# Patient Record
Sex: Male | Born: 1960
Health system: Southern US, Community
[De-identification: ages and names within clinical notes are randomized; demographics above are authoritative.]

## PROBLEM LIST (undated history)

## (undated) DIAGNOSIS — C61 Malignant neoplasm of prostate: Secondary | ICD-10-CM

## (undated) DIAGNOSIS — Z8042 Family history of malignant neoplasm of prostate: Secondary | ICD-10-CM

## (undated) DIAGNOSIS — Z803 Family history of malignant neoplasm of breast: Secondary | ICD-10-CM

## (undated) HISTORY — PX: VASECTOMY: SHX75

## (undated) HISTORY — DX: Family history of malignant neoplasm of prostate: Z80.42

## (undated) HISTORY — PX: PROSTATECTOMY: SHX69

## (undated) HISTORY — DX: Malignant neoplasm of prostate: C61

## (undated) HISTORY — DX: Family history of malignant neoplasm of breast: Z80.3

## (undated) HISTORY — PX: PROSTATE BIOPSY: SHX241

---

## 2006-08-10 ENCOUNTER — Emergency Department (HOSPITAL_COMMUNITY): Admission: EM | Admit: 2006-08-10 | Discharge: 2006-08-10 | Payer: Self-pay | Admitting: Emergency Medicine

## 2006-08-24 DIAGNOSIS — C61 Malignant neoplasm of prostate: Secondary | ICD-10-CM

## 2006-08-24 HISTORY — DX: Malignant neoplasm of prostate: C61

## 2006-09-22 ENCOUNTER — Ambulatory Visit (HOSPITAL_COMMUNITY): Admission: RE | Admit: 2006-09-22 | Discharge: 2006-09-22 | Payer: Self-pay | Admitting: Urology

## 2006-11-05 ENCOUNTER — Encounter (INDEPENDENT_AMBULATORY_CARE_PROVIDER_SITE_OTHER): Payer: Self-pay | Admitting: Specialist

## 2006-11-05 ENCOUNTER — Inpatient Hospital Stay (HOSPITAL_COMMUNITY): Admission: RE | Admit: 2006-11-05 | Discharge: 2006-11-07 | Payer: Self-pay | Admitting: Orthopedic Surgery

## 2007-01-04 ENCOUNTER — Ambulatory Visit: Payer: Self-pay | Admitting: Oncology

## 2007-02-01 LAB — CBC WITH DIFFERENTIAL/PLATELET
EOS%: 2.5 % (ref 0.0–7.0)
Eosinophils Absolute: 0.1 10*3/uL (ref 0.0–0.5)
LYMPH%: 36 % (ref 14.0–48.0)
MCH: 30.5 pg (ref 28.0–33.4)
MCV: 84.7 fL (ref 81.6–98.0)
MONO%: 8.1 % (ref 0.0–13.0)
Platelets: 237 10*3/uL (ref 145–400)
RBC: 5.32 10*6/uL (ref 4.20–5.71)
RDW: 12.9 % (ref 11.2–14.6)

## 2007-02-01 LAB — COMPREHENSIVE METABOLIC PANEL
AST: 33 U/L (ref 0–37)
Albumin: 4.6 g/dL (ref 3.5–5.2)
Alkaline Phosphatase: 96 U/L (ref 39–117)
BUN: 17 mg/dL (ref 6–23)
Glucose, Bld: 106 mg/dL — ABNORMAL HIGH (ref 70–99)
Potassium: 4.2 mEq/L (ref 3.5–5.3)
Sodium: 141 mEq/L (ref 135–145)
Total Bilirubin: 2.1 mg/dL — ABNORMAL HIGH (ref 0.3–1.2)
Total Protein: 7.3 g/dL (ref 6.0–8.3)

## 2007-02-01 LAB — PSA: PSA: 29.32 ng/mL — ABNORMAL HIGH (ref 0.10–4.00)

## 2007-02-16 ENCOUNTER — Ambulatory Visit (HOSPITAL_COMMUNITY): Admission: RE | Admit: 2007-02-16 | Discharge: 2007-02-16 | Payer: Self-pay | Admitting: Oncology

## 2007-03-01 ENCOUNTER — Ambulatory Visit: Payer: Self-pay | Admitting: Oncology

## 2007-03-03 LAB — COMPREHENSIVE METABOLIC PANEL
ALT: 47 U/L (ref 0–53)
Alkaline Phosphatase: 90 U/L (ref 39–117)
CO2: 25 mEq/L (ref 19–32)
Sodium: 140 mEq/L (ref 135–145)
Total Bilirubin: 2.2 mg/dL — ABNORMAL HIGH (ref 0.3–1.2)
Total Protein: 7.3 g/dL (ref 6.0–8.3)

## 2007-03-03 LAB — CBC WITH DIFFERENTIAL/PLATELET
BASO%: 0.5 % (ref 0.0–2.0)
LYMPH%: 36.8 % (ref 14.0–48.0)
MCHC: 36.3 g/dL — ABNORMAL HIGH (ref 32.0–35.9)
MONO#: 0.4 10*3/uL (ref 0.1–0.9)
Platelets: 270 10*3/uL (ref 145–400)
RBC: 5.23 10*6/uL (ref 4.20–5.71)
WBC: 6.3 10*3/uL (ref 4.0–10.0)

## 2007-03-03 LAB — PSA: PSA: 39.89 ng/mL — ABNORMAL HIGH (ref 0.10–4.00)

## 2007-04-05 LAB — COMPREHENSIVE METABOLIC PANEL
ALT: 56 U/L — ABNORMAL HIGH (ref 0–53)
Albumin: 4.5 g/dL (ref 3.5–5.2)
CO2: 27 mEq/L (ref 19–32)
Chloride: 107 mEq/L (ref 96–112)
Glucose, Bld: 69 mg/dL — ABNORMAL LOW (ref 70–99)
Potassium: 4.3 mEq/L (ref 3.5–5.3)
Sodium: 143 mEq/L (ref 135–145)
Total Bilirubin: 2 mg/dL — ABNORMAL HIGH (ref 0.3–1.2)
Total Protein: 7.2 g/dL (ref 6.0–8.3)

## 2007-04-05 LAB — CBC WITH DIFFERENTIAL/PLATELET
BASO%: 0.5 % (ref 0.0–2.0)
EOS%: 2.1 % (ref 0.0–7.0)
HCT: 44.1 % (ref 38.7–49.9)
LYMPH%: 37.4 % (ref 14.0–48.0)
MCH: 31.3 pg (ref 28.0–33.4)
MCHC: 36.3 g/dL — ABNORMAL HIGH (ref 32.0–35.9)
MCV: 86.1 fL (ref 81.6–98.0)
MONO%: 8.3 % (ref 0.0–13.0)
NEUT%: 51.7 % (ref 40.0–75.0)
Platelets: 235 10*3/uL (ref 145–400)

## 2007-04-05 LAB — PSA: PSA: 35.99 ng/mL — ABNORMAL HIGH (ref 0.10–4.00)

## 2007-04-29 ENCOUNTER — Ambulatory Visit: Payer: Self-pay | Admitting: Oncology

## 2007-05-03 LAB — COMPREHENSIVE METABOLIC PANEL
ALT: 47 U/L (ref 0–53)
AST: 33 U/L (ref 0–37)
CO2: 26 mEq/L (ref 19–32)
Chloride: 105 mEq/L (ref 96–112)
Creatinine, Ser: 1.02 mg/dL (ref 0.40–1.50)
Sodium: 141 mEq/L (ref 135–145)
Total Bilirubin: 2 mg/dL — ABNORMAL HIGH (ref 0.3–1.2)
Total Protein: 7.5 g/dL (ref 6.0–8.3)

## 2007-05-03 LAB — CBC WITH DIFFERENTIAL/PLATELET
BASO%: 0.5 % (ref 0.0–2.0)
EOS%: 2.6 % (ref 0.0–7.0)
HCT: 44.7 % (ref 38.7–49.9)
LYMPH%: 35.5 % (ref 14.0–48.0)
MCH: 31 pg (ref 28.0–33.4)
MCHC: 35.9 g/dL (ref 32.0–35.9)
MONO#: 0.4 10*3/uL (ref 0.1–0.9)
NEUT%: 54.4 % (ref 40.0–75.0)
RBC: 5.18 10*6/uL (ref 4.20–5.71)
WBC: 5.3 10*3/uL (ref 4.0–10.0)
lymph#: 1.9 10*3/uL (ref 0.9–3.3)

## 2007-08-11 ENCOUNTER — Ambulatory Visit: Payer: Self-pay | Admitting: Oncology

## 2007-08-15 LAB — COMPREHENSIVE METABOLIC PANEL
CO2: 25 mEq/L (ref 19–32)
Creatinine, Ser: 0.98 mg/dL (ref 0.40–1.50)
Glucose, Bld: 105 mg/dL — ABNORMAL HIGH (ref 70–99)
Sodium: 142 mEq/L (ref 135–145)
Total Bilirubin: 2 mg/dL — ABNORMAL HIGH (ref 0.3–1.2)
Total Protein: 7.9 g/dL (ref 6.0–8.3)

## 2007-08-15 LAB — CBC WITH DIFFERENTIAL/PLATELET
Eosinophils Absolute: 0.2 10*3/uL (ref 0.0–0.5)
HCT: 45.4 % (ref 38.7–49.9)
HGB: 15.9 g/dL (ref 13.0–17.1)
LYMPH%: 38.8 % (ref 14.0–48.0)
MONO#: 0.3 10*3/uL (ref 0.1–0.9)
NEUT#: 2.9 10*3/uL (ref 1.5–6.5)
NEUT%: 51.2 % (ref 40.0–75.0)
Platelets: 240 10*3/uL (ref 145–400)
WBC: 5.6 10*3/uL (ref 4.0–10.0)

## 2007-08-15 LAB — PSA: PSA: 3.07 ng/mL (ref 0.10–4.00)

## 2007-11-10 ENCOUNTER — Ambulatory Visit: Payer: Self-pay | Admitting: Oncology

## 2007-11-14 LAB — COMPREHENSIVE METABOLIC PANEL
ALT: 39 U/L (ref 0–53)
AST: 30 U/L (ref 0–37)
Albumin: 4.4 g/dL (ref 3.5–5.2)
Alkaline Phosphatase: 97 U/L (ref 39–117)
Calcium: 10 mg/dL (ref 8.4–10.5)
Chloride: 107 mEq/L (ref 96–112)
Potassium: 4.2 mEq/L (ref 3.5–5.3)
Sodium: 143 mEq/L (ref 135–145)
Total Protein: 7.3 g/dL (ref 6.0–8.3)

## 2007-11-14 LAB — CBC WITH DIFFERENTIAL/PLATELET
BASO%: 0.4 % (ref 0.0–2.0)
Basophils Absolute: 0 10*3/uL (ref 0.0–0.1)
EOS%: 2.3 % (ref 0.0–7.0)
HGB: 15 g/dL (ref 13.0–17.1)
MCH: 30.5 pg (ref 28.0–33.4)
MCHC: 35.5 g/dL (ref 32.0–35.9)
MONO%: 7.5 % (ref 0.0–13.0)
RBC: 4.91 10*6/uL (ref 4.20–5.71)
RDW: 12.7 % (ref 11.2–14.6)
lymph#: 1.9 10*3/uL (ref 0.9–3.3)

## 2008-02-10 ENCOUNTER — Ambulatory Visit: Payer: Self-pay | Admitting: Oncology

## 2008-02-10 LAB — CBC WITH DIFFERENTIAL/PLATELET
Eosinophils Absolute: 0.2 10*3/uL (ref 0.0–0.5)
MONO#: 0.4 10*3/uL (ref 0.1–0.9)
NEUT#: 3.4 10*3/uL (ref 1.5–6.5)
RBC: 4.94 10*6/uL (ref 4.20–5.71)
RDW: 12.7 % (ref 11.2–14.6)
WBC: 6.5 10*3/uL (ref 4.0–10.0)

## 2008-02-11 LAB — PSA: PSA: 1.22 ng/mL (ref 0.10–4.00)

## 2008-02-11 LAB — COMPREHENSIVE METABOLIC PANEL
Albumin: 4.5 g/dL (ref 3.5–5.2)
CO2: 24 mEq/L (ref 19–32)
Glucose, Bld: 83 mg/dL (ref 70–99)
Potassium: 4.2 mEq/L (ref 3.5–5.3)
Sodium: 143 mEq/L (ref 135–145)
Total Protein: 7.3 g/dL (ref 6.0–8.3)

## 2008-05-11 ENCOUNTER — Ambulatory Visit: Payer: Self-pay | Admitting: Oncology

## 2008-05-15 LAB — CBC WITH DIFFERENTIAL/PLATELET
BASO%: 0.6 % (ref 0.0–2.0)
EOS%: 3.2 % (ref 0.0–7.0)
LYMPH%: 36.7 % (ref 14.0–48.0)
MCH: 30.6 pg (ref 28.0–33.4)
MCHC: 35 g/dL (ref 32.0–35.9)
MONO#: 0.5 10*3/uL (ref 0.1–0.9)
Platelets: 238 10*3/uL (ref 145–400)
RBC: 4.88 10*6/uL (ref 4.20–5.71)
WBC: 5.2 10*3/uL (ref 4.0–10.0)
lymph#: 1.9 10*3/uL (ref 0.9–3.3)

## 2008-05-15 LAB — COMPREHENSIVE METABOLIC PANEL
Albumin: 4 g/dL (ref 3.5–5.2)
Alkaline Phosphatase: 114 U/L (ref 39–117)
BUN: 15 mg/dL (ref 6–23)
CO2: 31 mEq/L (ref 19–32)
Calcium: 9.8 mg/dL (ref 8.4–10.5)
Sodium: 141 mEq/L (ref 135–145)

## 2008-08-09 ENCOUNTER — Ambulatory Visit: Payer: Self-pay | Admitting: Oncology

## 2008-08-13 LAB — COMPREHENSIVE METABOLIC PANEL
ALT: 42 U/L (ref 0–53)
Albumin: 4.6 g/dL (ref 3.5–5.2)
Alkaline Phosphatase: 115 U/L (ref 39–117)
CO2: 26 mEq/L (ref 19–32)
Glucose, Bld: 85 mg/dL (ref 70–99)
Potassium: 4.3 mEq/L (ref 3.5–5.3)
Sodium: 140 mEq/L (ref 135–145)
Total Protein: 7.5 g/dL (ref 6.0–8.3)

## 2008-08-13 LAB — CBC WITH DIFFERENTIAL/PLATELET
Eosinophils Absolute: 0.2 10*3/uL (ref 0.0–0.5)
MONO#: 0.5 10*3/uL (ref 0.1–0.9)
MONO%: 7.8 % (ref 0.0–13.0)
NEUT#: 3.6 10*3/uL (ref 1.5–6.5)
RBC: 4.8 10*6/uL (ref 4.20–5.71)
RDW: 12.8 % (ref 11.2–14.6)
WBC: 7 10*3/uL (ref 4.0–10.0)

## 2008-11-08 ENCOUNTER — Ambulatory Visit: Payer: Self-pay | Admitting: Oncology

## 2008-11-12 LAB — CBC WITH DIFFERENTIAL/PLATELET
Basophils Absolute: 0 10*3/uL (ref 0.0–0.1)
Eosinophils Absolute: 0.1 10*3/uL (ref 0.0–0.5)
HGB: 14.4 g/dL (ref 13.0–17.1)
MCV: 87.4 fL (ref 79.3–98.0)
MONO%: 6.3 % (ref 0.0–14.0)
NEUT#: 2.6 10*3/uL (ref 1.5–6.5)
RDW: 13.1 % (ref 11.0–14.6)
lymph#: 2.2 10*3/uL (ref 0.9–3.3)

## 2008-11-12 LAB — COMPREHENSIVE METABOLIC PANEL
Albumin: 4.1 g/dL (ref 3.5–5.2)
BUN: 16 mg/dL (ref 6–23)
Calcium: 9.4 mg/dL (ref 8.4–10.5)
Chloride: 105 mEq/L (ref 96–112)
Glucose, Bld: 102 mg/dL — ABNORMAL HIGH (ref 70–99)
Potassium: 4.1 mEq/L (ref 3.5–5.3)

## 2009-02-14 ENCOUNTER — Ambulatory Visit: Payer: Self-pay | Admitting: Oncology

## 2009-02-18 LAB — CBC WITH DIFFERENTIAL/PLATELET
BASO%: 0.4 % (ref 0.0–2.0)
EOS%: 3 % (ref 0.0–7.0)
HCT: 42.9 % (ref 38.4–49.9)
LYMPH%: 33.9 % (ref 14.0–49.0)
MCH: 30.7 pg (ref 27.2–33.4)
MCHC: 35.1 g/dL (ref 32.0–36.0)
MONO%: 7.4 % (ref 0.0–14.0)
NEUT%: 55.3 % (ref 39.0–75.0)
Platelets: 202 10*3/uL (ref 140–400)
RBC: 4.91 10*6/uL (ref 4.20–5.82)

## 2009-02-18 LAB — COMPREHENSIVE METABOLIC PANEL
ALT: 35 U/L (ref 0–53)
AST: 28 U/L (ref 0–37)
Alkaline Phosphatase: 119 U/L — ABNORMAL HIGH (ref 39–117)
Creatinine, Ser: 0.89 mg/dL (ref 0.40–1.50)
Total Bilirubin: 1.3 mg/dL — ABNORMAL HIGH (ref 0.3–1.2)

## 2009-02-18 LAB — PSA: PSA: 0.15 ng/mL (ref 0.10–4.00)

## 2009-05-23 ENCOUNTER — Ambulatory Visit: Payer: Self-pay | Admitting: Oncology

## 2009-05-27 ENCOUNTER — Ambulatory Visit (HOSPITAL_COMMUNITY): Admission: RE | Admit: 2009-05-27 | Discharge: 2009-05-27 | Payer: Self-pay | Admitting: Oncology

## 2009-05-27 LAB — CBC WITH DIFFERENTIAL/PLATELET
Basophils Absolute: 0 10*3/uL (ref 0.0–0.1)
EOS%: 3.1 % (ref 0.0–7.0)
Eosinophils Absolute: 0.2 10*3/uL (ref 0.0–0.5)
HGB: 16.3 g/dL (ref 13.0–17.1)
MONO%: 8.4 % (ref 0.0–14.0)
NEUT#: 2.8 10*3/uL (ref 1.5–6.5)
RBC: 5.3 10*6/uL (ref 4.20–5.82)
RDW: 12.7 % (ref 11.0–14.6)
lymph#: 1.8 10*3/uL (ref 0.9–3.3)

## 2009-05-27 LAB — COMPREHENSIVE METABOLIC PANEL
AST: 32 U/L (ref 0–37)
Albumin: 4.4 g/dL (ref 3.5–5.2)
Alkaline Phosphatase: 141 U/L — ABNORMAL HIGH (ref 39–117)
BUN: 12 mg/dL (ref 6–23)
Calcium: 9.8 mg/dL (ref 8.4–10.5)
Chloride: 102 mEq/L (ref 96–112)
Glucose, Bld: 100 mg/dL — ABNORMAL HIGH (ref 70–99)
Potassium: 4.3 mEq/L (ref 3.5–5.3)
Sodium: 138 mEq/L (ref 135–145)
Total Protein: 7.7 g/dL (ref 6.0–8.3)

## 2009-08-22 ENCOUNTER — Ambulatory Visit: Payer: Self-pay | Admitting: Oncology

## 2009-08-27 LAB — CBC WITH DIFFERENTIAL/PLATELET
EOS%: 2.9 % (ref 0.0–7.0)
Eosinophils Absolute: 0.2 10*3/uL (ref 0.0–0.5)
HGB: 16.3 g/dL (ref 13.0–17.1)
MCH: 30.5 pg (ref 27.2–33.4)
MCV: 87.7 fL (ref 79.3–98.0)
NEUT#: 2.1 10*3/uL (ref 1.5–6.5)
Platelets: 191 10*3/uL (ref 140–400)
RBC: 5.36 10*6/uL (ref 4.20–5.82)
RDW: 12.7 % (ref 11.0–14.6)
WBC: 5.8 10*3/uL (ref 4.0–10.3)
lymph#: 3 10*3/uL (ref 0.9–3.3)

## 2009-08-27 LAB — COMPREHENSIVE METABOLIC PANEL
ALT: 41 U/L (ref 0–53)
Potassium: 4.3 mEq/L (ref 3.5–5.3)
Total Bilirubin: 2 mg/dL — ABNORMAL HIGH (ref 0.3–1.2)
Total Protein: 7.4 g/dL (ref 6.0–8.3)

## 2009-08-27 LAB — TESTOSTERONE: Testosterone: 380.23 ng/dL (ref 350–890)

## 2009-08-27 LAB — PSA: PSA: 1.7 ng/mL (ref 0.10–4.00)

## 2009-11-28 ENCOUNTER — Ambulatory Visit: Payer: Self-pay | Admitting: Oncology

## 2009-12-02 LAB — CBC WITH DIFFERENTIAL/PLATELET
HCT: 41.8 % (ref 38.4–49.9)
HGB: 14.7 g/dL (ref 13.0–17.1)
LYMPH%: 42.7 % (ref 14.0–49.0)
MCH: 30.9 pg (ref 27.2–33.4)
MCHC: 35.1 g/dL (ref 32.0–36.0)
MONO#: 0.4 10*3/uL (ref 0.1–0.9)
NEUT%: 47.2 % (ref 39.0–75.0)
Platelets: 210 10*3/uL (ref 140–400)
RBC: 4.74 10*6/uL (ref 4.20–5.82)
RDW: 13.4 % (ref 11.0–14.6)

## 2009-12-02 LAB — COMPREHENSIVE METABOLIC PANEL
ALT: 34 U/L (ref 0–53)
AST: 29 U/L (ref 0–37)
BUN: 17 mg/dL (ref 6–23)
CO2: 24 mEq/L (ref 19–32)
Chloride: 105 mEq/L (ref 96–112)
Glucose, Bld: 107 mg/dL — ABNORMAL HIGH (ref 70–99)
Potassium: 4.4 mEq/L (ref 3.5–5.3)

## 2010-02-28 ENCOUNTER — Ambulatory Visit: Payer: Self-pay | Admitting: Oncology

## 2010-03-04 LAB — CBC WITH DIFFERENTIAL/PLATELET
BASO%: 0.6 % (ref 0.0–2.0)
Eosinophils Absolute: 0.2 10*3/uL (ref 0.0–0.5)
HCT: 41.5 % (ref 38.4–49.9)
HGB: 14.3 g/dL (ref 13.0–17.1)
LYMPH%: 43.6 % (ref 14.0–49.0)
MCH: 30.4 pg (ref 27.2–33.4)
NEUT%: 43.6 % (ref 39.0–75.0)
RBC: 4.69 10*6/uL (ref 4.20–5.82)
lymph#: 2.5 10*3/uL (ref 0.9–3.3)

## 2010-03-05 LAB — COMPREHENSIVE METABOLIC PANEL
AST: 36 U/L (ref 0–37)
BUN: 17 mg/dL (ref 6–23)
CO2: 23 mEq/L (ref 19–32)
Creatinine, Ser: 0.95 mg/dL (ref 0.40–1.50)
Glucose, Bld: 102 mg/dL — ABNORMAL HIGH (ref 70–99)
Potassium: 4.3 mEq/L (ref 3.5–5.3)
Sodium: 141 mEq/L (ref 135–145)

## 2010-03-05 LAB — PSA: PSA: 0.17 ng/mL (ref 0.10–4.00)

## 2010-06-02 ENCOUNTER — Ambulatory Visit: Payer: Self-pay | Admitting: Oncology

## 2010-06-04 LAB — COMPREHENSIVE METABOLIC PANEL
Chloride: 105 mEq/L (ref 96–112)
Sodium: 142 mEq/L (ref 135–145)

## 2010-06-04 LAB — CBC WITH DIFFERENTIAL/PLATELET
BASO%: 0.5 % (ref 0.0–2.0)
Eosinophils Absolute: 0.1 10*3/uL (ref 0.0–0.5)
HCT: 41.5 % (ref 38.4–49.9)
HGB: 14.6 g/dL (ref 13.0–17.1)
LYMPH%: 36.2 % (ref 14.0–49.0)
MCH: 31 pg (ref 27.2–33.4)
MCHC: 35.1 g/dL (ref 32.0–36.0)
MONO#: 0.4 10*3/uL (ref 0.1–0.9)
MONO%: 6.8 % (ref 0.0–14.0)
NEUT%: 54.8 % (ref 39.0–75.0)
Platelets: 241 10*3/uL (ref 140–400)
RBC: 4.7 10*6/uL (ref 4.20–5.82)
RDW: 12.6 % (ref 11.0–14.6)
WBC: 6.4 10*3/uL (ref 4.0–10.3)
lymph#: 2.3 10*3/uL (ref 0.9–3.3)

## 2010-08-28 ENCOUNTER — Ambulatory Visit: Payer: Self-pay | Admitting: Oncology

## 2010-08-28 LAB — CBC WITH DIFFERENTIAL/PLATELET
BASO%: 0.3 % (ref 0.0–2.0)
Basophils Absolute: 0 10*3/uL (ref 0.0–0.1)
EOS%: 1.4 % (ref 0.0–7.0)
Eosinophils Absolute: 0.1 10*3/uL (ref 0.0–0.5)
HCT: 41.7 % (ref 38.4–49.9)
HGB: 14.5 g/dL (ref 13.0–17.1)
LYMPH%: 26.8 % (ref 14.0–49.0)
MCH: 30.4 pg (ref 27.2–33.4)
MCHC: 34.7 g/dL (ref 32.0–36.0)
MCV: 87.4 fL (ref 79.3–98.0)
MONO#: 0.5 10*3/uL (ref 0.1–0.9)
MONO%: 6.3 % (ref 0.0–14.0)
NEUT#: 4.8 10*3/uL (ref 1.5–6.5)
NEUT%: 65.2 % (ref 39.0–75.0)
Platelets: 211 10*3/uL (ref 140–400)
RBC: 4.77 10*6/uL (ref 4.20–5.82)
RDW: 12.8 % (ref 11.0–14.6)
WBC: 7.4 10*3/uL (ref 4.0–10.3)
lymph#: 2 10*3/uL (ref 0.9–3.3)

## 2010-08-28 LAB — COMPREHENSIVE METABOLIC PANEL
ALT: 38 U/L (ref 0–53)
AST: 35 U/L (ref 0–37)
Albumin: 4.6 g/dL (ref 3.5–5.2)
Alkaline Phosphatase: 90 U/L (ref 39–117)
BUN: 16 mg/dL (ref 6–23)
CO2: 25 mEq/L (ref 19–32)
Calcium: 9.9 mg/dL (ref 8.4–10.5)
Chloride: 103 mEq/L (ref 96–112)
Creatinine, Ser: 1.11 mg/dL (ref 0.40–1.50)
Glucose, Bld: 86 mg/dL (ref 70–99)
Potassium: 4.3 mEq/L (ref 3.5–5.3)
Sodium: 141 mEq/L (ref 135–145)
Total Bilirubin: 1.5 mg/dL — ABNORMAL HIGH (ref 0.3–1.2)
Total Protein: 7.2 g/dL (ref 6.0–8.3)

## 2010-08-28 LAB — PSA: PSA: 0.41 ng/mL (ref <=4.00)

## 2010-09-11 ENCOUNTER — Other Ambulatory Visit: Payer: Self-pay | Admitting: Oncology

## 2010-09-11 DIAGNOSIS — C61 Malignant neoplasm of prostate: Secondary | ICD-10-CM

## 2010-09-13 ENCOUNTER — Other Ambulatory Visit: Payer: Self-pay | Admitting: Oncology

## 2010-09-13 DIAGNOSIS — Z8546 Personal history of malignant neoplasm of prostate: Secondary | ICD-10-CM

## 2010-09-14 ENCOUNTER — Encounter: Payer: Self-pay | Admitting: Oncology

## 2010-10-21 ENCOUNTER — Ambulatory Visit (HOSPITAL_COMMUNITY)
Admission: RE | Admit: 2010-10-21 | Discharge: 2010-10-21 | Disposition: A | Discharge status: Home / Self Care | Payer: BC Managed Care – PPO | Source: Ambulatory Visit | Attending: Oncology | Admitting: Oncology

## 2010-10-21 ENCOUNTER — Encounter (HOSPITAL_COMMUNITY)
Admission: RE | Admit: 2010-10-21 | Discharge: 2010-10-21 | Disposition: A | Discharge status: Home / Self Care | Payer: BC Managed Care – PPO | Source: Ambulatory Visit | Attending: Oncology | Admitting: Oncology

## 2010-10-21 ENCOUNTER — Other Ambulatory Visit: Payer: Self-pay | Admitting: Oncology

## 2010-10-21 ENCOUNTER — Encounter (HOSPITAL_BASED_OUTPATIENT_CLINIC_OR_DEPARTMENT_OTHER): Payer: BC Managed Care – PPO | Admitting: Oncology

## 2010-10-21 ENCOUNTER — Ambulatory Visit (HOSPITAL_COMMUNITY): Admission: RE | Admit: 2010-10-21 | Payer: Self-pay | Source: Ambulatory Visit

## 2010-10-21 ENCOUNTER — Encounter (HOSPITAL_COMMUNITY): Payer: Self-pay

## 2010-10-21 DIAGNOSIS — R109 Unspecified abdominal pain: Secondary | ICD-10-CM | POA: Insufficient documentation

## 2010-10-21 DIAGNOSIS — C61 Malignant neoplasm of prostate: Secondary | ICD-10-CM

## 2010-10-21 DIAGNOSIS — Z8546 Personal history of malignant neoplasm of prostate: Secondary | ICD-10-CM

## 2010-10-21 DIAGNOSIS — Z5111 Encounter for antineoplastic chemotherapy: Secondary | ICD-10-CM

## 2010-10-21 DIAGNOSIS — R599 Enlarged lymph nodes, unspecified: Secondary | ICD-10-CM | POA: Insufficient documentation

## 2010-10-21 LAB — CMP (CANCER CENTER ONLY)
Albumin: 4 g/dL (ref 3.3–5.5)
Alkaline Phosphatase: 98 U/L — ABNORMAL HIGH (ref 26–84)
BUN, Bld: 15 mg/dL (ref 7–22)
Chloride: 99 mEq/L (ref 98–108)
Potassium: 4.7 mEq/L (ref 3.3–4.7)
Sodium: 144 mEq/L (ref 128–145)
Total Bilirubin: 1.6 mg/dl (ref 0.20–1.60)
Total Protein: 7.7 g/dL (ref 6.4–8.1)

## 2010-10-21 LAB — CBC WITH DIFFERENTIAL/PLATELET
BASO%: 0.4 % (ref 0.0–2.0)
Basophils Absolute: 0 10*3/uL (ref 0.0–0.1)
Eosinophils Absolute: 0.2 10*3/uL (ref 0.0–0.5)
LYMPH%: 42 % (ref 14.0–49.0)
MCHC: 34.5 g/dL (ref 32.0–36.0)
MONO#: 0.4 10*3/uL (ref 0.1–0.9)
MONO%: 8.8 % (ref 0.0–14.0)
Platelets: 226 10*3/uL (ref 140–400)
lymph#: 2 10*3/uL (ref 0.9–3.3)

## 2010-10-21 LAB — TESTOSTERONE: Testosterone: 47.8 ng/dL — ABNORMAL LOW (ref 250–890)

## 2010-10-21 MED ORDER — TECHNETIUM TC 99M MEDRONATE IV KIT
23.7000 | PACK | Freq: Once | INTRAVENOUS | Status: AC | PRN
  Administered 2010-10-21: 24 via INTRAVENOUS

## 2010-10-21 MED ORDER — IOHEXOL 300 MG/ML  SOLN
100.0000 mL | Freq: Once | INTRAMUSCULAR | Status: AC | PRN
  Administered 2010-10-21: 100 mL via INTRAVENOUS

## 2010-10-23 ENCOUNTER — Encounter (HOSPITAL_BASED_OUTPATIENT_CLINIC_OR_DEPARTMENT_OTHER): Payer: BC Managed Care – PPO | Admitting: Oncology

## 2010-10-23 DIAGNOSIS — C61 Malignant neoplasm of prostate: Secondary | ICD-10-CM

## 2010-12-09 ENCOUNTER — Other Ambulatory Visit: Payer: Self-pay | Admitting: Oncology

## 2010-12-09 ENCOUNTER — Encounter (HOSPITAL_BASED_OUTPATIENT_CLINIC_OR_DEPARTMENT_OTHER): Payer: BC Managed Care – PPO | Admitting: Oncology

## 2010-12-09 DIAGNOSIS — C61 Malignant neoplasm of prostate: Secondary | ICD-10-CM

## 2010-12-09 LAB — COMPREHENSIVE METABOLIC PANEL
ALT: 29 U/L (ref 0–53)
AST: 35 U/L (ref 0–37)
Albumin: 4.5 g/dL (ref 3.5–5.2)
Calcium: 9.7 mg/dL (ref 8.4–10.5)
Chloride: 105 mEq/L (ref 96–112)
Potassium: 4.4 mEq/L (ref 3.5–5.3)
Total Protein: 7 g/dL (ref 6.0–8.3)

## 2010-12-09 LAB — CBC WITH DIFFERENTIAL/PLATELET
Basophils Absolute: 0 10*3/uL (ref 0.0–0.1)
EOS%: 3 % (ref 0.0–7.0)
MCH: 30.7 pg (ref 27.2–33.4)
MONO#: 0.4 10*3/uL (ref 0.1–0.9)
MONO%: 6.7 % (ref 0.0–14.0)
NEUT#: 2.8 10*3/uL (ref 1.5–6.5)
WBC: 5.6 10*3/uL (ref 4.0–10.3)

## 2010-12-12 ENCOUNTER — Encounter (HOSPITAL_BASED_OUTPATIENT_CLINIC_OR_DEPARTMENT_OTHER): Payer: BC Managed Care – PPO | Admitting: Oncology

## 2010-12-12 DIAGNOSIS — C61 Malignant neoplasm of prostate: Secondary | ICD-10-CM

## 2010-12-12 DIAGNOSIS — Z5111 Encounter for antineoplastic chemotherapy: Secondary | ICD-10-CM

## 2011-01-09 NOTE — Discharge Summary (Signed)
David Huffman, David Huffman NO.:  [id number]   MEDICAL RECORD NO.:  [id number]          PATIENT TYPE:  INP   LOCATION:  1423                         FACILITY:  [facility]   PHYSICIAN:  Heloise Purpura, MD      DATE OF BIRTH:  [birth year]   DATE OF ADMISSION:  11/05/2006  DATE OF DISCHARGE:  11/07/2006                               DISCHARGE SUMMARY   ADMISSION DIAGNOSIS:  Prostate cancer.   DISCHARGE DIAGNOSIS:  Prostate cancer.   PROCEDURES:  1. Robotic assisted laparoscopic radical prostatectomy.  2. Bilateral laparoscopic pelvic lymphadenectomy.   HISTORY OF PRESENT ILLNESS:  For full details please see admission  history and physical.  Briefly, Mr. 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  He underwent a bone scan which was  negative and a CT scan which demonstrated a 2.7 cm right iliac lymph  node.  After discussing options, he elected to proceed with aggressive  surgical resection with the above procedures.   HOSPITAL COURSE:  On November 05, 2006, the patient was taken to the  operating room and underwent the above procedures.  He tolerated his  surgery well without complications.  Postoperatively, he was able to be  transferred to a regular hospital room following recovery from  anesthesia.  He was able to begin ambulating the night of surgery.  On  postoperative day #1, his diet was gradually advanced to clear liquids.  He maintained excellent urine output with low output from his pelvic  drain.  His hemoglobin levels remained stable.  He was able to continue  ambulating without difficulty.  He did have two bouts of emesis and  therefore was monitored and continued on IV fluid hydration on  postoperative day #1.  By postoperative day #2, he had return of bowel  function and was able to be transitioned to oral pain medication and  discharged home.   DISPOSITION:  Home.   DISCHARGE MEDICATIONS:  The patient was instructed to use Darvocet as  needed for pain, Colace as a stool softener, and to begin Cipro one day  prior to his return visit for Foley catheter removal.   DISCHARGE INSTRUCTIONS:  The patient was instructed to advance his diet  as tolerated.  He was told to be ambulatory, but specifically instructed  to refrain from any heavy lifting, strenuous activity, or driving.  He  was also instructed on routine Foley catheter care.   FOLLOW UP:  Mr. Imran will follow-up in one week for removal of his Foley  catheter as well as to discuss his surgical pathology in detail.           ______________________________  Heloise Purpura, MD  Electronically Signed     LB/MEDQ  D:  11/07/2006  T:  11/08/2006  Job:  [id number]

## 2011-01-09 NOTE — Op Note (Signed)
NAMEDESTAN, FRANCHINI NO.:  [id number]   MEDICAL RECORD NO.:  [id number]          PATIENT TYPE:  INP   LOCATION:  1423                         FACILITY:  [facility]   PHYSICIAN:  Heloise Purpura, MD      DATE OF BIRTH:  Dec 28, 1960   DATE OF PROCEDURE:  11/05/2006  DATE OF DISCHARGE:  11/07/2006                               OPERATIVE REPORT   PREOPERATIVE DIAGNOSIS:  Clinically localized adenocarcinoma of  prostate.   POSTOPERATIVE DIAGNOSIS:  Clinically localized adenocarcinoma of  prostate.   PROCEDURE:  1. Robotic assisted laparoscopic radical prostatectomy (non-nerve      sparing).  2. Extended bilateral pelvic lymphadenectomy.   SURGEON:  Heloise Purpura, M.D.   ASSISTANT:  Cornelious Bryant, M.D.   ANESTHESIA:  General.   COMPLICATIONS:  None.   ESTIMATED BLOOD LOSS:  200 mL.   SPECIMENS SENT TO PATHOLOGY:  1. Prostate and seminal vesicles.  2. Right pelvic lymph nodes.  3. Left pelvic lymph nodes.   DRAINS:  1. 20 French Coude catheter.  2. 19 Blake pelvic drain.   INDICATIONS:  Mr. Auxier is a 50 year old gentleman who was recently  diagnosed with locally advanced and possible node positive prostate  cancer.  His presenting PSA was 35.4 with a Gleason score of 4+4=8.  A  CT scan demonstrated a 2.7 cm enlarged right pelvic lymph node.  His  digital rectal exam was worrisome for locally advanced disease.  His pre-  treatment IPSS was 1 with an IEF score of 25.  After a thorough  discussion regarding management options and a second opinion at the [facility], the patient elects to proceed with aggressive  surgical resection with the knowledge that he will almost definitely  require adjuvant or subsequent therapy.  The potential risks and  benefits of the above procedures were discussed with the patient.  He  consented.   DESCRIPTION OF PROCEDURE:  The patient was taken to the operating room  and a general anesthetic was  administered.  He was given preoperative  antibiotics, placed in the dorsal lithotomy position, and prepped and  draped in the usual sterile fashion.  Next, a preoperative time out was  performed.  A Foley catheter was then inserted into the bladder.  A site  was selected just superior to the umbilicus for placement of the camera  port.  This was placed using a standard open Hassan technique.  This  allowed entry into the peritoneal cavity under direct vision without  difficulty.  A 12 mm port was then placed and a pneumoperitoneum  established.  The 0 degree lens was then used to inspect the abdomen and  there was no evidence of any intra-abdominal injuries or other  abnormalities.  The remaining ports were then placed.  Bilateral 8 mm  robotic ports were placed and approximately 10 cm lateral and just below  the level of the original camera port.  An additional 8 mm robotic port  was placed in the far left lateral abdominal wall.  A 5 mm port was  placed between the camera  port and the right robotic port.  An  additional 12 mm port was placed in the far right lateral abdominal  wall.  All ports were placed under direct vision without difficulty.   The surgical cart was then docked.  With the aid of the cautery  scissors, the bladder was reflected posteriorly allowing entry into  space of Retzius and identification of the endopelvic fascia and  prostate.  The endopelvic fascia was then incised from the apex back to  the base of the prostate bilaterally and the underlying levator muscle  fibers were swept laterally off the prostate.  The dorsal venous complex  was, therefore, isolated and was then stapled and divided with a 45 mm  Flex ETS stapler.  Attention was then turned to the bladder neck which  was identified with the aid of Foley catheter manipulation.  The bladder  neck was incised anteriorly and the Foley catheter exposed.  The  catheter was then brought into the operative field  and used to retract  the prostate anteriorly.  This exposed the posterior bladder neck nicely  which was then divided and dissection continued between the bladder neck  and prostate posteriorly until the vasa deferentia and seminal vesicles  were identified.   The vasa deferentia were then dissected free and isolated.  They were  divided and lifted anteriorly.  Seminal vesicles were dissected out in  their entirety down to the tips.  At this point, the seminal vesicle  arterial blood supply was controlled and the seminal vesicles were  lifted anteriorly.  The space between Denonvilliers' fascia and the  anterior rectum was then bluntly developed, thereby isolating the  vascular pedicles of the prostate.  The vascular pedicles of the  prostate were then ligated in a wide non-nerve sparing fashion with Hem-  O-Lok clips.  The urethra was then sharply divided and the prostate was  disarticulated.  The pelvis was then copiously irrigated.  With  irrigation in the pelvis, air was injected into the rectal catheter and  there was no evidence of a rectal injury.  Hemostasis appeared excellent  at this point.   Attention was then turned to the right pelvic sidewall.  The fibrofatty  tissue of the pelvic lymph nodes was dissected free with Hem-O-Lok clips  used for lymphostasis and hemostasis.  The extent of the lymph node  dissection extended from the genitofemoral nerve laterally down to the  hypogastric artery posteriorly, to the bifurcation of the common iliac  vessels superiorly, and to Cooper's ligament inferiorly.  Care was taken  to preserve the obturator nerve.  An identical procedure was performed  on the contralateral side.  All lymph node packets were removed for  permanent pathologic analysis.   Attention was then turned to the urethral anastomosis.  A 2-0 Vicryl  slip knot was placed at the 6 o'clock position between the bladder neck and urethra to reapproximate these structures.   A double armed 3-0  Monocryl suture was then used to perform a 360 degrees running tension  free anastomosis between the bladder neck and urethra.  A 20 French  Coude catheter was then inserted into the bladder and irrigated.  There  were no blood clots within the bladder and the anastomosis appeared to  be watertight.  A #19 Blake drain was then brought through the left  robotic port and appropriately positioned in the pelvis.  It was secured  to the skin with a nylon suture.  The surgical cart was  then undocked.  The prostate specimen was then placed into the endopouch retrieval bag  via the periumbilical port site.  A 0 Vicryl suture was then used to  close the abdominal wall fascia of the right lateral 12 mm port site  with the aid of the suture passer device.  The prostate specimen was  then removed intact within the endopouch retrieval bag.  This fascial  opening was then closed with a 0 Vicryl running suture.   All port sites were then injected with 0.25% Marcaine and reapproximated  at the skin level with staples.  The patient appeared to tolerate the  procedure well without complications.  He was able to be extubated and  transferred to the recovery unit in satisfactory condition.           ______________________________  Heloise Purpura, MD  Electronically Signed     LB/MEDQ  D:  11/05/2006  T:  11/07/2006  Job:  [id number]

## 2011-01-09 NOTE — H&P (Signed)
David Huffman, David Huffman NO.:  [id number]   MEDICAL RECORD NO.:  [id number]          PATIENT TYPE:  INP   LOCATION:  1423                         FACILITY:  [facility]   PHYSICIAN:  Heloise Purpura, MD      DATE OF BIRTH:  1961-07-07   DATE OF ADMISSION:  11/05/2006  DATE OF DISCHARGE:                              HISTORY & PHYSICAL   CHIEF COMPLAINT:  Prostate cancer.   HISTORY OF PRESENT ILLNESS:  This patient is a 50 year old Caucasian  gentleman who presented to Dr. Vevelyn Royals office in consultation for his  recent diagnosis of prostate cancer.  The patient was diagnosed by an  abnormal digital rectal examination and was found to have a PSA of 35.4.  He subsequently underwent prostate biopsy by Dr. Patsi Sears on September 14, 2006, that revealed a 4+4=8 prostate cancer with 40% on the right  lateral aspect and 10% on the right central core.  80% of the apical  biopsy was also involved.  Prostate volume was about 20 mL.  He  subsequently underwent a metastatic evaluation including a bone scan  which was negative.  CT scan revealed 2.7 cm right iliac lymph node.  His IPSS score was 1 with a bother score of 0.  His IIEF score was 25  out of 25 with preserved erectile function.  He has had extensive  counseling and the patient elected to undergo robotic prostatectomy.  He  was also given the option of enrolling into a clinical trial at [facility].  He was advised about the option of open surgery.   PAST MEDICAL HISTORY:  None.   PAST SURGICAL HISTORY:  Vasectomy.   MEDICATIONS:  None.   ALLERGIES:  No known drug allergies.   FAMILY HISTORY:  The grandfather and uncle on the father's side had  prostate cancer, and his father and mother are the age of 44.   SOCIAL HISTORY:  He works as a Gaffer.  He is married.  He  denies tobacco.  He drinks approximately 2 glasses of alcohol per day.   REVIEW OF SYSTEMS:  Has been done and is otherwise negative.   PHYSICAL  EXAMINATION:  He is afebrile, vital signs stage.  He is in no acute distress.  He is awake, alert and oriented.  His pupils are equal, round and reactive to light with extraocular  motion that is intact.  Neck examination reveals no evidence of adenopathy.  He is in regular rate and rhythm with no obvious murmurs.  He has clear breath sounds bilaterally.  Abdominal examination reveals a soft abdomen that is nontender with no  abdominal masses.  No CVA tenderness.  Gross motor neurological exam is nonfocal.  Rectal examination reveals an induration on the right side of the  prostate with possible extension outside the prostate towards the  seminal vesicles as well as the apex of the prostate.  The left side was  palpably normal.   IMPRESSION:  This is a high-risk prostate cancer (clinical stage T3 N1  MX).   PLAN:  The patient was given latest therapeutic  options.  A long  discussion was undertaken with him.  The patient does potentially have  positive lymph nodes for prostate cancer.  He is a candidate to undergo  a clinical trial which was discussed with him.  The patient, however,  elected to undergo robotic prostatectomy and extended pelvic  lymphadenectomy with the understanding that he might be converted to  open prostatectomy.  The patient will undergo the procedure today and  will subsequently be admitted to the floor for postoperative care.     ______________________________  Cornelious Bryant, MD      Heloise Purpura, MD  Electronically Signed    SK/MEDQ  D:  11/05/2006  T:  11/07/2006  Job:  [id number]

## 2011-03-11 ENCOUNTER — Other Ambulatory Visit: Payer: Self-pay | Admitting: Oncology

## 2011-03-11 ENCOUNTER — Encounter (HOSPITAL_BASED_OUTPATIENT_CLINIC_OR_DEPARTMENT_OTHER): Payer: BC Managed Care – PPO | Admitting: Oncology

## 2011-03-11 DIAGNOSIS — C61 Malignant neoplasm of prostate: Secondary | ICD-10-CM

## 2011-03-11 LAB — CBC WITH DIFFERENTIAL/PLATELET
BASO%: 0.7 % (ref 0.0–2.0)
Eosinophils Absolute: 0.2 10*3/uL (ref 0.0–0.5)
LYMPH%: 39.5 % (ref 14.0–49.0)
MCHC: 35 g/dL (ref 32.0–36.0)
MCV: 88.6 fL (ref 79.3–98.0)
MONO%: 8 % (ref 0.0–14.0)
Platelets: 201 10*3/uL (ref 140–400)
RBC: 4.77 10*6/uL (ref 4.20–5.82)

## 2011-03-11 LAB — TESTOSTERONE: Testosterone: <10 ng/dL — ABNORMAL LOW (ref 250–890)

## 2011-03-11 LAB — COMPREHENSIVE METABOLIC PANEL
BUN: 15 mg/dL (ref 6–23)
CO2: 26 mEq/L (ref 19–32)
Calcium: 9.5 mg/dL (ref 8.4–10.5)
Chloride: 104 mEq/L (ref 96–112)
Creatinine, Ser: 0.9 mg/dL (ref 0.50–1.35)
Glucose, Bld: 88 mg/dL (ref 70–99)
Total Bilirubin: 1.3 mg/dL — ABNORMAL HIGH (ref 0.3–1.2)

## 2011-03-11 LAB — PSA: PSA: 1.11 ng/mL (ref <=4.00)

## 2011-03-13 ENCOUNTER — Encounter (HOSPITAL_BASED_OUTPATIENT_CLINIC_OR_DEPARTMENT_OTHER): Payer: BC Managed Care – PPO | Admitting: Oncology

## 2011-03-13 DIAGNOSIS — Z5111 Encounter for antineoplastic chemotherapy: Secondary | ICD-10-CM

## 2011-03-13 DIAGNOSIS — C61 Malignant neoplasm of prostate: Secondary | ICD-10-CM

## 2011-06-17 ENCOUNTER — Other Ambulatory Visit: Payer: Self-pay | Admitting: Oncology

## 2011-06-17 ENCOUNTER — Encounter (HOSPITAL_BASED_OUTPATIENT_CLINIC_OR_DEPARTMENT_OTHER): Payer: BC Managed Care – PPO | Admitting: Oncology

## 2011-06-17 DIAGNOSIS — Z5111 Encounter for antineoplastic chemotherapy: Secondary | ICD-10-CM

## 2011-06-17 DIAGNOSIS — C61 Malignant neoplasm of prostate: Secondary | ICD-10-CM

## 2011-06-17 LAB — CBC WITH DIFFERENTIAL/PLATELET
BASO%: 0.6 % (ref 0.0–2.0)
HCT: 41.9 % (ref 38.4–49.9)
MCHC: 35.1 g/dL (ref 32.0–36.0)
MONO#: 0.5 10*3/uL (ref 0.1–0.9)
NEUT%: 47 % (ref 39.0–75.0)
RBC: 4.75 10*6/uL (ref 4.20–5.82)
RDW: 12.7 % (ref 11.0–14.6)
WBC: 5.4 10*3/uL (ref 4.0–10.3)
lymph#: 2.2 10*3/uL (ref 0.9–3.3)

## 2011-06-17 LAB — COMPREHENSIVE METABOLIC PANEL
ALT: 37 U/L (ref 0–53)
CO2: 26 mEq/L (ref 19–32)
Calcium: 9.8 mg/dL (ref 8.4–10.5)
Chloride: 104 mEq/L (ref 96–112)
Sodium: 141 mEq/L (ref 135–145)
Total Protein: 7 g/dL (ref 6.0–8.3)

## 2011-06-17 LAB — PSA: PSA: 2.42 ng/mL (ref <=4.00)

## 2011-06-19 ENCOUNTER — Encounter (HOSPITAL_BASED_OUTPATIENT_CLINIC_OR_DEPARTMENT_OTHER): Payer: BC Managed Care – PPO | Admitting: Oncology

## 2011-06-19 ENCOUNTER — Other Ambulatory Visit: Payer: Self-pay | Admitting: Oncology

## 2011-06-19 DIAGNOSIS — C61 Malignant neoplasm of prostate: Secondary | ICD-10-CM

## 2011-06-19 DIAGNOSIS — Z5111 Encounter for antineoplastic chemotherapy: Secondary | ICD-10-CM

## 2011-09-15 ENCOUNTER — Other Ambulatory Visit: Payer: BC Managed Care – PPO | Admitting: Lab

## 2011-09-15 ENCOUNTER — Encounter (HOSPITAL_COMMUNITY): Payer: Self-pay

## 2011-09-15 ENCOUNTER — Encounter (HOSPITAL_COMMUNITY)
Admission: RE | Admit: 2011-09-15 | Discharge: 2011-09-15 | Disposition: A | Discharge status: Home / Self Care | Payer: BC Managed Care – PPO | Source: Ambulatory Visit | Attending: Oncology | Admitting: Oncology

## 2011-09-15 ENCOUNTER — Encounter: Payer: Self-pay | Admitting: *Deleted

## 2011-09-15 DIAGNOSIS — C61 Malignant neoplasm of prostate: Secondary | ICD-10-CM

## 2011-09-15 DIAGNOSIS — Z9079 Acquired absence of other genital organ(s): Secondary | ICD-10-CM | POA: Insufficient documentation

## 2011-09-15 DIAGNOSIS — K7689 Other specified diseases of liver: Secondary | ICD-10-CM | POA: Insufficient documentation

## 2011-09-15 LAB — CMP (CANCER CENTER ONLY)
AST: 60 U/L — ABNORMAL HIGH (ref 11–38)
Albumin: 4.1 g/dL (ref 3.3–5.5)
Alkaline Phosphatase: 93 U/L — ABNORMAL HIGH (ref 26–84)
BUN, Bld: 15 mg/dL (ref 7–22)
Potassium: 4.6 mEq/L (ref 3.3–4.7)
Sodium: 144 mEq/L (ref 128–145)
Total Bilirubin: 1.8 mg/dl — ABNORMAL HIGH (ref 0.20–1.60)

## 2011-09-15 LAB — CBC WITH DIFFERENTIAL/PLATELET
BASO%: 0.6 % (ref 0.0–2.0)
EOS%: 2.8 % (ref 0.0–7.0)
MCH: 30.7 pg (ref 27.2–33.4)
MCHC: 34.9 g/dL (ref 32.0–36.0)
RBC: 5.01 10*6/uL (ref 4.20–5.82)
RDW: 12.9 % (ref 11.0–14.6)
lymph#: 1.9 10*3/uL (ref 0.9–3.3)

## 2011-09-15 MED ORDER — TECHNETIUM TC 99M MEDRONATE IV KIT
25.0000 | PACK | Freq: Once | INTRAVENOUS | Status: AC | PRN
  Administered 2011-09-15: 25 via INTRAVENOUS

## 2011-09-15 MED ORDER — IOHEXOL 300 MG/ML  SOLN
100.0000 mL | Freq: Once | INTRAMUSCULAR | Status: AC | PRN
  Administered 2011-09-15: 100 mL via INTRAVENOUS

## 2011-09-16 ENCOUNTER — Ambulatory Visit: Payer: BC Managed Care – PPO | Admitting: Internal Medicine

## 2011-09-18 ENCOUNTER — Encounter: Payer: Self-pay | Admitting: *Deleted

## 2011-09-18 ENCOUNTER — Ambulatory Visit (HOSPITAL_BASED_OUTPATIENT_CLINIC_OR_DEPARTMENT_OTHER): Payer: BC Managed Care – PPO | Admitting: Oncology

## 2011-09-18 ENCOUNTER — Telehealth: Payer: Self-pay | Admitting: Oncology

## 2011-09-18 VITALS — BP 127/83 | HR 72 | Temp 98.9°F | Ht 68.5 in | Wt 172.4 lb

## 2011-09-18 DIAGNOSIS — C61 Malignant neoplasm of prostate: Secondary | ICD-10-CM

## 2011-09-18 MED ORDER — LEUPROLIDE ACETATE (3 MONTH) 22.5 MG IM KIT
22.5000 mg | PACK | Freq: Once | INTRAMUSCULAR | Status: AC
  Administered 2011-09-18: 22.5 mg via INTRAMUSCULAR
  Filled 2011-09-18: qty 22.5

## 2011-09-18 NOTE — Progress Notes (Signed)
Hematology and Oncology Follow Up Visit  David Huffman [id number] 1961-04-25 51 y.o. 09/18/2011 10:18 AM  CC: David Heck, MD    Principle Diagnosis: A 50 year old gentleman with prostate cancer diagnosed in March 2008, Gleason score 7, PSA of 31.    Prior Therapy: 1. Status post prostatectomy done in 2008, pathology revealing T2c N0 with 0 out of 12 lymph nodes involved, pathology revealed 4 + 3 equals 7 Gleason score.  2. The patient continued to have rise in his PSA up to 52 without any measurable disease.  Current therapy: He is on hormonal deprivation, Lupron 22.5 mg every 3 months on a continuous basis.  PSA nadir down to 0.4 and then rose up to 2.42 in 05/2011. Casodex 50 mg daily added in 05/2011.   Interim History:  David Huffman is a pleasant 51 year old gentleman with prostate cancer.  He has continued to have a slow rise in his PSA despite castrate level testosterone.  Casodex was added in 05/2011. He tolerated very well at this time. He has continued to be asymptomatic from prostate cancer, however.  He had not reported any chest pain.  Had not reported any back pain.  Had not reported any genitourinary complaints.  He does report some fatigue, but he is very active.  He works full-time and continues to bike on a regular basis, however, felt that his endurance may be slightly decreased.  But otherwise he had not really had any other decline in his energy or decline in his health.  Overall performance status and activity level is about the same. He is reporting slight increase in hot flashes but no breast tenderness.    Medications: I have reviewed the patient's current medications. Current outpatient prescriptions:Ascorbic Acid (VITAMIN C) 1000 MG tablet, Take 1,000 mg by mouth daily., Disp: , Rfl: ;  b complex vitamins tablet, Take 1 tablet by mouth daily., Disp: , Rfl: ;  bicalutamide (CASODEX) 50 MG tablet, Take 50 mg by mouth daily., Disp: , Rfl: ;  Calcium Carbonate-Vitamin D  (CALCIUM + D PO), Take 1 tablet by mouth daily., Disp: , Rfl:  leuprolide (LUPRON) 22.5 MG injection, Inject 22.5 mg into the muscle every 3 (three) months., Disp: , Rfl: ;  Multiple Vitamins-Minerals (MULTIVITAMIN PO), Take 1 tablet by mouth daily., Disp: , Rfl: ;  VITAMIN D, ERGOCALCIFEROL, PO, Take 1 tablet by mouth daily., Disp: , Rfl: ;  Lactase (LACTAID PO), Take by mouth daily as needed., Disp: , Rfl:  Current facility-administered medications:leuprolide (LUPRON) injection 22.5 mg, 22.5 mg, Intramuscular, Once, Eli Hose, MD  Allergies: No Known Allergies  Past Medical History, Surgical history, Social history, and Family History were reviewed and updated.  Review of Systems: Constitutional:  Negative for fever, chills, night sweats, anorexia, weight loss, pain. Cardiovascular: no chest pain or dyspnea on exertion Respiratory: no cough, shortness of breath, or wheezing Neurological: no TIA or stroke symptoms Dermatological: negative ENT: negative Skin: Negative. Gastrointestinal: no abdominal pain, change in bowel habits, or black or bloody stools Genito-Urinary: no dysuria, trouble voiding, or hematuria Hematological and Lymphatic: negative Breast: negative Musculoskeletal: negative Remaining ROS negative. Physical Exam: Blood pressure 127/83, pulse 72, temperature 98.9 F (37.2 C), temperature source Oral, height 5' 8.5" (1.74 m), weight 172 lb 6.4 oz (78.2 kg). ECOG: 0 General appearance: alert Head: Normocephalic, without obvious abnormality, atraumatic Neck: no adenopathy, no carotid bruit, no JVD, supple, symmetrical, trachea midline and thyroid not enlarged, symmetric, no tenderness/mass/nodules Lymph nodes: Cervical, supraclavicular, and axillary nodes normal. Heart:regular  rate and rhythm, S1, S2 normal, no murmur, click, rub or gallop Lung:chest clear, no wheezing, rales, normal symmetric air entry Abdomin: soft, non-tender, without masses or organomegaly EXT:no  erythema, induration, or nodules   Lab Results: Lab Results  Component Value Date   WBC 4.8 09/15/2011   HGB 15.4 09/15/2011   HCT 44.0 09/15/2011   MCV 87.9 09/15/2011   PLT 216 09/15/2011     Chemistry      Component Value Date/Time   NA 144 09/15/2011 0953   NA 141 06/17/2011 0906   NA 141 06/17/2011 0906   K 4.6 09/15/2011 0953   K 4.6 06/17/2011 0906   K 4.6 06/17/2011 0906   CL 103 09/15/2011 0953   CL 104 06/17/2011 0906   CL 104 06/17/2011 0906   CO2 28 09/15/2011 0953   CO2 26 06/17/2011 0906   CO2 26 06/17/2011 0906   BUN 15 09/15/2011 0953   BUN 17 06/17/2011 0906   BUN 17 06/17/2011 0906   CREATININE 0.9 09/15/2011 0953   CREATININE 1.00 06/17/2011 0906   CREATININE 1.00 06/17/2011 0906      Component Value Date/Time   CALCIUM 9.3 09/15/2011 0953   CALCIUM 9.8 06/17/2011 0906   CALCIUM 9.8 06/17/2011 0906   ALKPHOS 93* 09/15/2011 0953   ALKPHOS 97 06/17/2011 0906   ALKPHOS 97 06/17/2011 0906   AST 60* 09/15/2011 0953   AST 33 06/17/2011 0906   AST 33 06/17/2011 0906   ALT 37 06/17/2011 0906   ALT 37 06/17/2011 0906   BILITOT 1.80* 09/15/2011 0953   BILITOT 1.9* 06/17/2011 0906   BILITOT 1.9* 06/17/2011 0906     Results for David Huffman, David Huffman (MRN [id number]) as of 09/18/2011 10:20  Ref. Range 06/17/2011 09:06 09/15/2011 09:53  PSA Latest Range: <=4.00 ng/mL 2.42 0.60    CT CHEST, ABDOMEN AND PELVIS WITH CONTRAST  Technique: Multidetector CT imaging of the chest, abdomen and  pelvis was performed following the standard protocol during bolus  administration of intravenous contrast.  Contrast: OMNIPAQUE IOHEXOL 300 MG/ML IV SOLN  Comparison: CT scan to 10/21/2010  CT CHEST  Findings:  Mediastinum: Heart size is normal. No pericardial fluid,  thickening or calcification. No acute abnormality of the thoracic  aorta or other great vessels of the mediastinum. No filling  defects are noted within the central pulmonary arterial tree to  suggest large pulmonary  emboli. No pathologically enlarged  mediastinal or hilar lymph nodes. Small amount of soft tissue in  the anterior mediastinum demonstrates internal fatty stippling, and  is unchanged compared to prior study, most consistent with a trace  amount of residual thymic tissue. The esophagus is normal in  appearance.  Lungs/Pleura: No consolidative airspace disease. No pleural  effusions. No suspicious appearing pulmonary nodules or masses.  Musculoskeletal: No aggressive appearing lytic or blastic lesions  are noted in the visualized portions of the skeleton.  IMPRESSION:  1. No findings to suggest metastatic disease to the thorax.  2. No acute abnormality.  CT ABDOMEN AND PELVIS  Findings:  Abdomen/Pelvis: 2 mm low attenuation lesion in segment 4A of the  liver (image 53 of series 2) is unchanged in retrospect compared to  prior study 10/21/2010, and though too small to characterize is  strongly favored to represent a benign lesion such as a small cyst  or biliary hamartoma. The remainder of the liver is otherwise  unremarkable. The infused appearance of the gallbladder, pancreas,  spleen, bilateral adrenal glands and bilateral kidneys  is  unremarkable. No urinary tract calculi or hydroureteronephrosis.  No ascites or pneumoperitoneum. No pathologic distention of bowel.  No abnormal soft tissue masses or pathologically enlarged lymph  nodes noted. Previously noted borderline enlarged right external  iliac lymph node is slightly smaller than prior study, measuring 9  mm in short axis on today's examination. Normal appendix. Urinary  bladder is unremarkable in appearance. The patient is status post  radical prostatectomy.  Musculoskeletal: No aggressive appearing lytic or blastic lesions  noted in the visualized portions of the skeleton.  IMPRESSION:  1. No findings to suggest metastatic disease in the abdomen or  pelvis.  2. No acute abnormality.  3. Status post radical prostatectomy.      NUCLEAR MEDICINE WHOLE BODY BONE SCINTIGRAPHY  Technique: Whole body anterior and posterior images were obtained  approximately 3 hours after intravenous injection of  radiopharmaceutical.  Radiopharmaceutical: 24.9 mCi of technetium 39m MDP  Comparison: 10/21/2010  Findings: There are no abnormal foci of increased or decreased  radiotracer uptake specific for bony metastatic disease.  Physiologic tracer uptake is noted within the kidneys and urinary  bladder.  IMPRESSION:  1. No scintigraphic evidence for bone metastasis.     Impression and Plan:   ASSESSMENT AND PLAN:  This is a pleasant 51 year old gentleman with the following issues:  1. 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  His last testosterone level in July 2012 was less than 10.  At this time, given his doubling time that is approaching 3 months, I added Casodex to his regimen in 05/2011. He tolerated well without any complications. PSA dropped as well to 0.6 Imaging studies reviewed today and did not show any measurable disease at this time. The plan is to continue combined androgen deprivation with Lupron and  Casodex.  2. Androgen deprivation.  Will continue Lupron today and every 3 months.  3. Bone health.  Continue calcium and vitamin D supplementation.   4. Followup will be in 3 months' time.   Argenis Kumari, MD 1/25/201310:18 AM

## 2011-09-18 NOTE — Telephone Encounter (Signed)
Gv pt appt for april2013 °

## 2011-09-25 ENCOUNTER — Other Ambulatory Visit: Payer: Self-pay | Admitting: *Deleted

## 2011-09-25 DIAGNOSIS — C61 Malignant neoplasm of prostate: Secondary | ICD-10-CM

## 2011-09-25 MED ORDER — BICALUTAMIDE 50 MG PO TABS: 50.0000 mg | ORAL_TABLET | Freq: Every day | ORAL | Status: DC

## 2011-09-25 NOTE — Telephone Encounter (Signed)
[facility]ed [facility] for casodex [phone]

## 2011-12-11 ENCOUNTER — Telehealth: Payer: Self-pay | Admitting: Oncology

## 2011-12-11 NOTE — Telephone Encounter (Signed)
Moved 4/26 appt to 4/25 due to FS out of office. lmonvm for pt re change w/new d/t. Also confirmed 4/23 appt and added note to 4/23 appt to send pt for new schedule.

## 2011-12-15 ENCOUNTER — Other Ambulatory Visit (HOSPITAL_BASED_OUTPATIENT_CLINIC_OR_DEPARTMENT_OTHER): Payer: BC Managed Care – PPO | Admitting: Lab

## 2011-12-15 DIAGNOSIS — C61 Malignant neoplasm of prostate: Secondary | ICD-10-CM

## 2011-12-15 LAB — COMPREHENSIVE METABOLIC PANEL
Albumin: 4.9 g/dL (ref 3.5–5.2)
BUN: 16 mg/dL (ref 6–23)
CO2: 28 mEq/L (ref 19–32)
Calcium: 9.9 mg/dL (ref 8.4–10.5)
Chloride: 104 mEq/L (ref 96–112)
Creatinine, Ser: 1.03 mg/dL (ref 0.50–1.35)
Glucose, Bld: 81 mg/dL (ref 70–99)
Potassium: 4.3 mEq/L (ref 3.5–5.3)

## 2011-12-15 LAB — CBC WITH DIFFERENTIAL/PLATELET
BASO%: 0.7 % (ref 0.0–2.0)
Basophils Absolute: 0 10*3/uL (ref 0.0–0.1)
Eosinophils Absolute: 0.2 10*3/uL (ref 0.0–0.5)
HCT: 44.4 % (ref 38.4–49.9)
HGB: 15.4 g/dL (ref 13.0–17.1)
LYMPH%: 38.2 % (ref 14.0–49.0)
MCHC: 34.8 g/dL (ref 32.0–36.0)
MONO#: 0.4 10*3/uL (ref 0.1–0.9)
NEUT%: 49.5 % (ref 39.0–75.0)
Platelets: 198 10*3/uL (ref 140–400)
WBC: 5.4 10*3/uL (ref 4.0–10.3)

## 2011-12-15 LAB — PSA: PSA: 0.73 ng/mL (ref <=4.00)

## 2011-12-15 LAB — TESTOSTERONE: Testosterone: 20.7 ng/dL — ABNORMAL LOW (ref 300–890)

## 2011-12-17 ENCOUNTER — Ambulatory Visit (HOSPITAL_BASED_OUTPATIENT_CLINIC_OR_DEPARTMENT_OTHER): Payer: BC Managed Care – PPO | Admitting: Oncology

## 2011-12-17 ENCOUNTER — Telehealth: Payer: Self-pay | Admitting: Oncology

## 2011-12-17 VITALS — BP 119/72 | HR 62 | Temp 97.0°F | Ht 68.5 in | Wt 169.9 lb

## 2011-12-17 DIAGNOSIS — C61 Malignant neoplasm of prostate: Secondary | ICD-10-CM

## 2011-12-17 MED ORDER — LEUPROLIDE ACETATE (3 MONTH) 22.5 MG IM KIT
22.5000 mg | PACK | Freq: Once | INTRAMUSCULAR | Status: AC
  Administered 2011-12-17: 22.5 mg via INTRAMUSCULAR
  Filled 2011-12-17: qty 22.5

## 2011-12-17 NOTE — Progress Notes (Signed)
Hematology and Oncology Follow Up Visit  David Huffman [id number] 12/09/1960 51 y.o. 12/17/2011 12:37 PM  CC: David Heck, MD    Principle Diagnosis: A 51 year old gentleman with prostate cancer diagnosed in March 2008, Gleason score 7, PSA of 31.    Prior Therapy: 1. Status post prostatectomy done in 2008, pathology revealing T2c N0 with 0 out of 12 lymph nodes involved, pathology revealed 4 + 3 equals 7 Gleason score.  2. The patient continued to have rise in his PSA up to 52 without any measurable disease.  Current therapy: He is on hormonal deprivation, Lupron 22.5 mg every 3 months on a continuous basis.  PSA nadir down to 0.4 and then rose up to 2.42 in 05/2011. Casodex 50 mg daily added in 05/2011.   Interim History:  David Huffman is a pleasant 51 year old gentleman with prostate cancer.  He has continued to PSA under control with Casodex that was added in 05/2011. He tolerated very well at this time. He has continued to be asymptomatic from prostate cancer, however.  He had not reported any chest pain.  Had not reported any back pain.  Had not reported any genitourinary complaints.  He does report some fatigue, but he is very active.  He works full-time and continues to bike on a regular basis, however, felt that his endurance may be slightly decreased.  But otherwise he had not really had any other decline in his energy or decline in his health.  Overall performance status and activity level is about the same. He is reporting slight increase in hot flashes but no breast tenderness.    Medications: I have reviewed the patient's current medications. Current outpatient prescriptions:Ascorbic Acid (VITAMIN C) 1000 MG tablet, Take 1,000 mg by mouth daily., Disp: , Rfl: ;  b complex vitamins tablet, Take 1 tablet by mouth daily., Disp: , Rfl: ;  bicalutamide (CASODEX) 50 MG tablet, Take 1 tablet (50 mg total) by mouth daily., Disp: 90 tablet, Rfl: 1;  Calcium Carbonate-Vitamin D (CALCIUM + D PO),  Take 1 tablet by mouth daily., Disp: , Rfl:  Lactase (LACTAID PO), Take by mouth daily as needed., Disp: , Rfl: ;  leuprolide (LUPRON) 22.5 MG injection, Inject 22.5 mg into the muscle every 3 (three) months., Disp: , Rfl: ;  Multiple Vitamins-Minerals (MULTIVITAMIN PO), Take 1 tablet by mouth daily., Disp: , Rfl: ;  VITAMIN D, ERGOCALCIFEROL, PO, Take 1 tablet by mouth daily., Disp: , Rfl:  Current facility-administered medications:leuprolide (LUPRON) injection 22.5 mg, 22.5 mg, Intramuscular, Once, Benjiman Core, MD, 22.5 mg at 12/17/11 1224  Allergies: No Known Allergies  Past Medical History, Surgical history, Social history, and Family History were reviewed and updated.  Review of Systems: Constitutional:  Negative for fever, chills, night sweats, anorexia, weight loss, pain. Cardiovascular: no chest pain or dyspnea on exertion Respiratory: no cough, shortness of breath, or wheezing Neurological: no TIA or stroke symptoms Dermatological: negative ENT: negative Skin: Negative. Gastrointestinal: no abdominal pain, change in bowel habits, or black or bloody stools Genito-Urinary: no dysuria, trouble voiding, or hematuria Hematological and Lymphatic: negative Breast: negative Musculoskeletal: negative Remaining ROS negative. Physical Exam: Blood pressure 119/72, pulse 62, temperature 97 F (36.1 C), temperature source Oral, height 5' 8.5" (1.74 m), weight 169 lb 14.4 oz (77.066 kg). ECOG: 0 General appearance: alert Head: Normocephalic, without obvious abnormality, atraumatic Neck: no adenopathy, no carotid bruit, no JVD, supple, symmetrical, trachea midline and thyroid not enlarged, symmetric, no tenderness/mass/nodules Lymph nodes: Cervical, supraclavicular, and axillary  nodes normal. Heart:regular rate and rhythm, S1, S2 normal, no murmur, click, rub or gallop Lung:chest clear, no wheezing, rales, normal symmetric air entry Abdomin: soft, non-tender, without masses or  organomegaly EXT:no erythema, induration, or nodules   Lab Results: Lab Results  Component Value Date   WBC 5.4 12/15/2011   HGB 15.4 12/15/2011   HCT 44.4 12/15/2011   MCV 88.2 12/15/2011   PLT 198 12/15/2011     Chemistry      Component Value Date/Time   NA 140 12/15/2011 0904   NA 144 09/15/2011 0953   K 4.3 12/15/2011 0904   K 4.6 09/15/2011 0953   CL 104 12/15/2011 0904   CL 103 09/15/2011 0953   CO2 28 12/15/2011 0904   CO2 28 09/15/2011 0953   BUN 16 12/15/2011 0904   BUN 15 09/15/2011 0953   CREATININE 1.03 12/15/2011 0904   CREATININE 0.9 09/15/2011 0953      Component Value Date/Time   CALCIUM 9.9 12/15/2011 0904   CALCIUM 9.3 09/15/2011 0953   ALKPHOS 93 12/15/2011 0904   ALKPHOS 93* 09/15/2011 0953   AST 35 12/15/2011 0904   AST 60* 09/15/2011 0953   ALT 36 12/15/2011 0904   BILITOT 1.6* 12/15/2011 0904   BILITOT 1.80* 09/15/2011 0953      Results for David Huffman (MRN [id number]) as of 12/17/2011 12:02  Ref. Range 09/15/2011 09:53 12/15/2011 09:04  PSA Latest Range: <=4.00 ng/mL 0.60 0.73      Impression and Plan:   ASSESSMENT AND PLAN:  This is a pleasant 51 year old gentleman with the following issues:  1. 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 Casodex was added to his regimen in 05/2011. He tolerated well without any complications. PSA dropped as well to 0.6 (now at 0.73) Imaging studies  did not show any measurable disease at this time. The plan is to continue combined androgen deprivation with Lupron and  Casodex.  2. Androgen deprivation.  Will continue Lupron today and every 3 months. Next dose will be in 02/2012 3. Bone health.  Continue calcium and vitamin D supplementation.   4. Followup will be in 3 months' time.   David Hose, MD 4/25/201312:37 PM

## 2011-12-17 NOTE — Telephone Encounter (Signed)
Gv pt appt for july2013 °

## 2011-12-18 ENCOUNTER — Ambulatory Visit: Payer: BC Managed Care – PPO | Admitting: Oncology

## 2012-01-05 ENCOUNTER — Other Ambulatory Visit: Payer: Self-pay | Admitting: *Deleted

## 2012-01-05 DIAGNOSIS — C61 Malignant neoplasm of prostate: Secondary | ICD-10-CM

## 2012-01-05 MED ORDER — BICALUTAMIDE 50 MG PO TABS: 50.0000 mg | ORAL_TABLET | Freq: Every day | ORAL | Status: DC

## 2012-03-15 ENCOUNTER — Other Ambulatory Visit (HOSPITAL_BASED_OUTPATIENT_CLINIC_OR_DEPARTMENT_OTHER): Payer: BC Managed Care – PPO | Admitting: Lab

## 2012-03-15 DIAGNOSIS — C61 Malignant neoplasm of prostate: Secondary | ICD-10-CM

## 2012-03-15 LAB — CBC WITH DIFFERENTIAL/PLATELET
BASO%: 0.5 % (ref 0.0–2.0)
Eosinophils Absolute: 0.2 10*3/uL (ref 0.0–0.5)
LYMPH%: 37.7 % (ref 14.0–49.0)
MCHC: 34.8 g/dL (ref 32.0–36.0)
MONO#: 0.5 10*3/uL (ref 0.1–0.9)
NEUT#: 2.6 10*3/uL (ref 1.5–6.5)
RBC: 4.57 10*6/uL (ref 4.20–5.82)
RDW: 13 % (ref 11.0–14.6)
WBC: 5.2 10*3/uL (ref 4.0–10.3)

## 2012-03-15 LAB — PSA: PSA: 0.95 ng/mL (ref <=4.00)

## 2012-03-15 LAB — COMPREHENSIVE METABOLIC PANEL
ALT: 46 U/L (ref 0–53)
Albumin: 4.2 g/dL (ref 3.5–5.2)
Alkaline Phosphatase: 84 U/L (ref 39–117)
CO2: 26 mEq/L (ref 19–32)
Glucose, Bld: 86 mg/dL (ref 70–99)
Potassium: 4.7 mEq/L (ref 3.5–5.3)
Sodium: 139 mEq/L (ref 135–145)
Total Bilirubin: 1.4 mg/dL — ABNORMAL HIGH (ref 0.3–1.2)
Total Protein: 6.6 g/dL (ref 6.0–8.3)

## 2012-03-18 ENCOUNTER — Telehealth: Payer: Self-pay | Admitting: Oncology

## 2012-03-18 ENCOUNTER — Ambulatory Visit (HOSPITAL_BASED_OUTPATIENT_CLINIC_OR_DEPARTMENT_OTHER): Payer: BC Managed Care – PPO | Admitting: Oncology

## 2012-03-18 VITALS — BP 124/77 | HR 65 | Temp 97.3°F | Ht 68.5 in | Wt 170.4 lb

## 2012-03-18 DIAGNOSIS — C61 Malignant neoplasm of prostate: Secondary | ICD-10-CM

## 2012-03-18 DIAGNOSIS — Z5111 Encounter for antineoplastic chemotherapy: Secondary | ICD-10-CM

## 2012-03-18 MED ORDER — LEUPROLIDE ACETATE (3 MONTH) 22.5 MG IM KIT
22.5000 mg | PACK | Freq: Once | INTRAMUSCULAR | Status: AC
  Administered 2012-03-18: 22.5 mg via INTRAMUSCULAR
  Filled 2012-03-18: qty 22.5

## 2012-03-18 NOTE — Telephone Encounter (Signed)
Gave pt appt for October 2013 lab draw and MD visit in November 2013

## 2012-03-18 NOTE — Progress Notes (Signed)
Hematology and Oncology Follow Up Visit  David Huffman [id number] 25-Dec-1960 51 y.o. 03/18/2012 10:11 AM  CC: David Heck, MD    Principle Diagnosis: A 51 year old gentleman with prostate cancer diagnosed in March 2008, Gleason score 7, PSA of 31.   Prior Therapy: 1. Status post prostatectomy done in 2008, pathology revealing T2c N0 with 0 out of 12 lymph nodes involved, pathology revealed 4 + 3 equals 7 Gleason score.  2. The patient continued to have rise in his PSA up to 52 without any measurable disease.  Current therapy: He is on hormonal deprivation, Lupron 22.5 mg every 3 months on a continuous basis.  PSA nadir down to 0.4 and then rose up to 2.42 in 05/2011. Casodex 50 mg daily added in 05/2011.   Interim History:  David Huffman is a pleasant 51 year old gentleman with prostate cancer.  He has continued to PSA under control with Casodex that was added in 05/2011. He tolerated very well at this time. He has continued to be asymptomatic from prostate cancer, however.  He had not reported any chest pain.  Had not reported any back pain.  Had not reported any genitourinary complaints.  He does report some fatigue, but he is very active.  He works full-time and continues to bike on a regular basis, however, felt that his endurance may be slightly decreased.  But otherwise he had not really had any other decline in his energy or decline in his health.  Overall performance status and activity level is about the same. He is reporting slight increase in hot flashes but no breast tenderness.  No new complaints since the last visit.    Medications: I have reviewed the patient's current medications. Current outpatient prescriptions:Ascorbic Acid (VITAMIN C) 1000 MG tablet, Take 1,000 mg by mouth daily., Disp: , Rfl: ;  b complex vitamins tablet, Take 1 tablet by mouth daily., Disp: , Rfl: ;  bicalutamide (CASODEX) 50 MG tablet, Take 1 tablet (50 mg total) by mouth daily., Disp: 90 tablet, Rfl: 1;   Calcium Carbonate-Vitamin D (CALCIUM + D PO), Take 1 tablet by mouth daily., Disp: , Rfl:  Lactase (LACTAID PO), Take by mouth daily as needed., Disp: , Rfl: ;  leuprolide (LUPRON) 22.5 MG injection, Inject 22.5 mg into the muscle every 3 (three) months., Disp: , Rfl: ;  Multiple Vitamins-Minerals (MULTIVITAMIN PO), Take 1 tablet by mouth daily., Disp: , Rfl: ;  VITAMIN D, ERGOCALCIFEROL, PO, Take 1 tablet by mouth daily., Disp: , Rfl:  Current facility-administered medications:leuprolide (LUPRON) injection 22.5 mg, 22.5 mg, Intramuscular, Once, David Core, MD, 22.5 mg at 03/18/12 1003  Allergies: No Known Allergies  Past Medical History, Surgical history, Social history, and Family History were reviewed and updated.  Review of Systems: Constitutional:  Negative for fever, chills, night sweats, anorexia, weight loss, pain. Cardiovascular: no chest pain or dyspnea on exertion Respiratory: no cough, shortness of breath, or wheezing Neurological: no TIA or stroke symptoms Dermatological: negative ENT: negative Skin: Negative. Gastrointestinal: no abdominal pain, change in bowel habits, or black or bloody stools Genito-Urinary: no dysuria, trouble voiding, or hematuria Hematological and Lymphatic: negative Breast: negative Musculoskeletal: negative Remaining ROS negative. Physical Exam: Blood pressure 124/77, pulse 65, temperature 97.3 F (36.3 C), temperature source Oral, height 5' 8.5" (1.74 m), weight 170 lb 6.4 oz (77.293 kg). ECOG: 0 General appearance: alert Head: Normocephalic, without obvious abnormality, atraumatic Neck: no adenopathy, no carotid bruit, no JVD, supple, symmetrical, trachea midline and thyroid not enlarged, symmetric, no  tenderness/mass/nodules Lymph nodes: Cervical, supraclavicular, and axillary nodes normal. Heart:regular rate and rhythm, S1, S2 normal, no murmur, click, rub or gallop Lung:chest clear, no wheezing, rales, normal symmetric air entry Abdomin:  soft, non-tender, without masses or organomegaly EXT:no erythema, induration, or nodules   Lab Results: Lab Results  Component Value Date   WBC 5.2 03/15/2012   HGB 14.1 03/15/2012   HCT 40.7 03/15/2012   MCV 89.1 03/15/2012   PLT 204 03/15/2012     Chemistry      Component Value Date/Time   NA 139 03/15/2012 0911   NA 144 09/15/2011 0953   K 4.7 03/15/2012 0911   K 4.6 09/15/2011 0953   CL 106 03/15/2012 0911   CL 103 09/15/2011 0953   CO2 26 03/15/2012 0911   CO2 28 09/15/2011 0953   BUN 15 03/15/2012 0911   BUN 15 09/15/2011 0953   CREATININE 0.92 03/15/2012 0911   CREATININE 0.9 09/15/2011 0953      Component Value Date/Time   CALCIUM 9.5 03/15/2012 0911   CALCIUM 9.3 09/15/2011 0953   ALKPHOS 84 03/15/2012 0911   ALKPHOS 93* 09/15/2011 0953   AST 107* 03/15/2012 0911   AST 60* 09/15/2011 0953   ALT 46 03/15/2012 0911   BILITOT 1.4* 03/15/2012 0911   BILITOT 1.80* 09/15/2011 0953      Results for David Huffman (MRN [id number]) as of 03/18/2012 09:32  Ref. Range 12/15/2011 09:04 03/15/2012 09:11  PSA Latest Range: <=4.00 ng/mL 0.73 0.95     Impression and Plan:   ASSESSMENT AND PLAN:  This is a pleasant 51 year old gentleman with the following issues:  1. 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 Casodex was added to his regimen in 05/2011. He tolerated well without any complications. PSA dropped as well to 0.6 (now at 0.95).  Imaging studies  did not show any measurable disease at this time (08/2011). The plan is to continue combined androgen deprivation with Lupron and  Casodex. If his PSA continues to rise, I will restage him with CT scan and bone scan. 2. Androgen deprivation.  Will continue Lupron today and every 3 months. Next dose will be in 05/2012 3. Bone health.  Continue calcium and vitamin D supplementation.   4. Followup will be in 3 months' time.   [facility], MD 7/26/201310:11 AM

## 2012-04-04 ENCOUNTER — Telehealth: Payer: Self-pay | Admitting: *Deleted

## 2012-04-04 NOTE — Telephone Encounter (Signed)
Message received from collaborative nurse to refill casodex.  On Jan 05, 2012 pt. was given 90-day supply with one additional refill.  Called patient with instructions to call [facility] to obtain the next 90-day supply.

## 2012-06-17 ENCOUNTER — Other Ambulatory Visit (HOSPITAL_BASED_OUTPATIENT_CLINIC_OR_DEPARTMENT_OTHER): Payer: BC Managed Care – PPO | Admitting: Lab

## 2012-06-17 DIAGNOSIS — C61 Malignant neoplasm of prostate: Secondary | ICD-10-CM

## 2012-06-17 LAB — CBC WITH DIFFERENTIAL/PLATELET
BASO%: 0.7 % (ref 0.0–2.0)
EOS%: 2.9 % (ref 0.0–7.0)
HCT: 44 % (ref 38.4–49.9)
LYMPH%: 38.9 % (ref 14.0–49.0)
MCH: 31 pg (ref 27.2–33.4)
MCHC: 35 g/dL (ref 32.0–36.0)
MONO%: 8.8 % (ref 0.0–14.0)
NEUT%: 48.7 % (ref 39.0–75.0)
Platelets: 217 10*3/uL (ref 140–400)
RBC: 4.96 10*6/uL (ref 4.20–5.82)
WBC: 5.5 10*3/uL (ref 4.0–10.3)

## 2012-06-17 LAB — COMPREHENSIVE METABOLIC PANEL (CC13)
AST: 31 U/L (ref 5–34)
Albumin: 4.4 g/dL (ref 3.5–5.0)
Alkaline Phosphatase: 119 U/L (ref 40–150)
BUN: 16 mg/dL (ref 7.0–26.0)
Creatinine: 0.9 mg/dL (ref 0.7–1.3)
Glucose: 76 mg/dl (ref 70–99)
Total Bilirubin: 1.9 mg/dL — ABNORMAL HIGH (ref 0.20–1.20)

## 2012-06-17 LAB — PSA: PSA: 0.65 ng/mL (ref <=4.00)

## 2012-06-24 ENCOUNTER — Telehealth: Payer: Self-pay | Admitting: Oncology

## 2012-06-24 ENCOUNTER — Ambulatory Visit (HOSPITAL_BASED_OUTPATIENT_CLINIC_OR_DEPARTMENT_OTHER): Payer: BC Managed Care – PPO | Admitting: Oncology

## 2012-06-24 VITALS — BP 116/74 | HR 59 | Temp 97.5°F | Resp 20 | Ht 68.5 in | Wt 170.9 lb

## 2012-06-24 DIAGNOSIS — C61 Malignant neoplasm of prostate: Secondary | ICD-10-CM

## 2012-06-24 DIAGNOSIS — Z5111 Encounter for antineoplastic chemotherapy: Secondary | ICD-10-CM

## 2012-06-24 MED ORDER — LEUPROLIDE ACETATE (3 MONTH) 22.5 MG IM KIT
22.5000 mg | PACK | Freq: Once | INTRAMUSCULAR | Status: AC
  Administered 2012-06-24: 22.5 mg via INTRAMUSCULAR
  Filled 2012-06-24: qty 22.5

## 2012-06-24 NOTE — Progress Notes (Signed)
Hematology and Oncology Follow Up Visit  David Huffman [id number] 01-May-1961 51 y.o. 06/24/2012 9:48 AM  CC: [name], MD    Principle Diagnosis: A 51 year old gentleman with prostate cancer diagnosed in March 2008, Gleason score 7, PSA of 31.  Prior Therapy: 1. Status post prostatectomy done in 2008, pathology revealing T2c N0 with 0 out of 12 lymph nodes involved, pathology revealed 4 + 3 equals 7 Gleason score.  2. The patient continued to have rise in his PSA up to 52 without any measurable disease.  Current therapy: He is on hormonal deprivation, Lupron 22.5 mg every 3 months on a continuous basis.  PSA nadir down to 0.4 and then rose up to 2.42 in 05/2011. Casodex 50 mg daily added in 05/2011.  Interim History:  David Huffman is a pleasant 51 year old gentleman with prostate cancer.  He has continued to PSA under control with Casodex that was added in 05/2011. He tolerated very well at this time. He has continued to be asymptomatic from prostate cancer.  He had not reported any chest pain.  Had not reported any back pain.  Had not reported any genitourinary complaints.  He does report some fatigue, but he is very active.  He works full-time and continues to bike on a regular basis, however, felt that his endurance may be slightly decreased.  But otherwise he had not really had any other decline in his energy or decline in his health.  Overall performance status and activity level is about the same. He is reporting slight increase in hot flashes but no breast tenderness.  No new complaints since the last visit.    Medications: I have reviewed the patient's current medications. Current outpatient prescriptions:Ascorbic Acid (VITAMIN C) 1000 MG tablet, Take 1,000 mg by mouth daily., Disp: , Rfl: ;  b complex vitamins tablet, Take 1 tablet by mouth daily., Disp: , Rfl: ;  bicalutamide (CASODEX) 50 MG tablet, Take 1 tablet (50 mg total) by mouth daily., Disp: 90 tablet, Rfl: 1;  Calcium  Carbonate-Vitamin D (CALCIUM + D PO), Take 1 tablet by mouth daily., Disp: , Rfl:  Lactase (LACTAID PO), Take by mouth daily as needed., Disp: , Rfl: ;  leuprolide (LUPRON) 22.5 MG injection, Inject 22.5 mg into the muscle every 3 (three) months., Disp: , Rfl: ;  Multiple Vitamins-Minerals (MULTIVITAMIN PO), Take 1 tablet by mouth daily., Disp: , Rfl: ;  VITAMIN D, ERGOCALCIFEROL, PO, Take 1 tablet by mouth daily., Disp: , Rfl:  Current facility-administered medications:leuprolide (LUPRON) injection 22.5 mg, 22.5 mg, Intramuscular, Once, Benjiman Core, MD, 22.5 mg at 06/24/12 0935  Allergies: No Known Allergies  Past Medical History, Surgical history, Social history, and Family History were reviewed and updated.  Review of Systems: Constitutional:  Negative for fever, chills, night sweats, anorexia, weight loss, pain. Cardiovascular: no chest pain or dyspnea on exertion Respiratory: no cough, shortness of breath, or wheezing Neurological: no TIA or stroke symptoms Dermatological: negative ENT: negative Skin: Negative. Gastrointestinal: no abdominal pain, change in bowel habits, or black or bloody stools Genito-Urinary: no dysuria, trouble voiding, or hematuria Hematological and Lymphatic: negative Breast: negative Musculoskeletal: negative Remaining ROS negative. Physical Exam: Blood pressure 116/74, pulse 59, temperature 97.5 F (36.4 C), temperature source Oral, resp. rate 20, height 5' 8.5" (1.74 m), weight 170 lb 14.4 oz (77.52 kg). ECOG: 0 General appearance: alert Head: Normocephalic, without obvious abnormality, atraumatic Neck: no adenopathy, no carotid bruit, no JVD, supple, symmetrical, trachea midline and thyroid not enlarged, symmetric, no  tenderness/mass/nodules Lymph nodes: Cervical, supraclavicular, and axillary nodes normal. Heart:regular rate and rhythm, S1, S2 normal, no murmur, click, rub or gallop Lung:chest clear, no wheezing, rales, normal symmetric air  entry Abdomin: soft, non-tender, without masses or organomegaly EXT:no erythema, induration, or nodules   Lab Results: Lab Results  Component Value Date   WBC 5.5 06/17/2012   HGB 15.4 06/17/2012   HCT 44.0 06/17/2012   MCV 88.6 06/17/2012   PLT 217 06/17/2012     Chemistry      Component Value Date/Time   NA 140 06/17/2012 0908   NA 139 03/15/2012 0911   NA 144 09/15/2011 0953   K 4.7 06/17/2012 0908   K 4.7 03/15/2012 0911   K 4.6 09/15/2011 0953   CL 106 06/17/2012 0908   CL 106 03/15/2012 0911   CL 103 09/15/2011 0953   CO2 26 06/17/2012 0908   CO2 26 03/15/2012 0911   CO2 28 09/15/2011 0953   BUN 16.0 06/17/2012 0908   BUN 15 03/15/2012 0911   BUN 15 09/15/2011 0953   CREATININE 0.9 06/17/2012 0908   CREATININE 0.92 03/15/2012 0911   CREATININE 0.9 09/15/2011 0953      Component Value Date/Time   CALCIUM 10.3 06/17/2012 0908   CALCIUM 9.5 03/15/2012 0911   CALCIUM 9.3 09/15/2011 0953   ALKPHOS 119 06/17/2012 0908   ALKPHOS 84 03/15/2012 0911   ALKPHOS 93* 09/15/2011 0953   AST 31 06/17/2012 0908   AST 107* 03/15/2012 0911   AST 60* 09/15/2011 0953   ALT 31 06/17/2012 0908   ALT 46 03/15/2012 0911   BILITOT 1.90* 06/17/2012 0908   BILITOT 1.4* 03/15/2012 0911   BILITOT 1.80* 09/15/2011 0953      Results for David Huffman, David Huffman (MRN [id number]) as of 06/24/2012 09:49  Ref. Range 03/15/2012 09:11 06/17/2012 09:08  PSA Latest Range: <=4.00 ng/mL 0.95 0.65   Impression and Plan:   ASSESSMENT AND PLAN:  This is a pleasant 51 year old gentleman with the following issues:  1. 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 Casodex was added to his regimen in 05/2011. He tolerated well without any complications. PSA dropped as well to 0.65.  Imaging studies did not show any measurable disease last time (08/2011). The plan is to continue combined androgen deprivation with Lupron and  Casodex given his excellent PSA response. I will restage him with CT  scan and bone scan in 08/2012. 2. Androgen deprivation.  Will continue Lupron today and every 3 months. Next dose will be in 09/2012 3. Bone health.  Continue calcium and vitamin D supplementation.   4. Followup will be in 3 months' time.   [facility], MD 11/1/20139:48 AM

## 2012-06-24 NOTE — Telephone Encounter (Signed)
appts made and printed for pt  °

## 2012-07-12 ENCOUNTER — Other Ambulatory Visit: Payer: Self-pay | Admitting: *Deleted

## 2012-07-12 DIAGNOSIS — C61 Malignant neoplasm of prostate: Secondary | ICD-10-CM

## 2012-07-12 MED ORDER — BICALUTAMIDE 50 MG PO TABS: 50.0000 mg | ORAL_TABLET | Freq: Every day | ORAL | Status: DC

## 2012-09-22 ENCOUNTER — Ambulatory Visit (HOSPITAL_COMMUNITY)
Admission: RE | Admit: 2012-09-22 | Discharge: 2012-09-22 | Disposition: A | Discharge status: Home / Self Care | Payer: BC Managed Care – PPO | Source: Ambulatory Visit | Attending: Oncology | Admitting: Oncology

## 2012-09-22 ENCOUNTER — Encounter (HOSPITAL_COMMUNITY): Payer: Self-pay

## 2012-09-22 ENCOUNTER — Encounter (HOSPITAL_COMMUNITY)
Admission: RE | Admit: 2012-09-22 | Discharge: 2012-09-22 | Disposition: A | Discharge status: Home / Self Care | Payer: BC Managed Care – PPO | Source: Ambulatory Visit | Attending: Oncology | Admitting: Oncology

## 2012-09-22 ENCOUNTER — Other Ambulatory Visit (HOSPITAL_BASED_OUTPATIENT_CLINIC_OR_DEPARTMENT_OTHER): Payer: BC Managed Care – PPO | Admitting: Lab

## 2012-09-22 DIAGNOSIS — Z9079 Acquired absence of other genital organ(s): Secondary | ICD-10-CM | POA: Insufficient documentation

## 2012-09-22 DIAGNOSIS — Z79899 Other long term (current) drug therapy: Secondary | ICD-10-CM | POA: Insufficient documentation

## 2012-09-22 DIAGNOSIS — C61 Malignant neoplasm of prostate: Secondary | ICD-10-CM

## 2012-09-22 LAB — CBC WITH DIFFERENTIAL/PLATELET
BASO%: 0.6 % (ref 0.0–2.0)
EOS%: 2.8 % (ref 0.0–7.0)
HCT: 41.2 % (ref 38.4–49.9)
LYMPH%: 38.2 % (ref 14.0–49.0)
MCH: 31.1 pg (ref 27.2–33.4)
MCHC: 35.8 g/dL (ref 32.0–36.0)
MCV: 87 fL (ref 79.3–98.0)
MONO%: 9.1 % (ref 0.0–14.0)
NEUT%: 49.3 % (ref 39.0–75.0)
Platelets: 203 10*3/uL (ref 140–400)
RBC: 4.73 10*6/uL (ref 4.20–5.82)
WBC: 5.2 10*3/uL (ref 4.0–10.3)

## 2012-09-22 LAB — COMPREHENSIVE METABOLIC PANEL (CC13)
ALT: 27 U/L (ref 0–55)
Alkaline Phosphatase: 103 U/L (ref 40–150)
CO2: 27 mEq/L (ref 22–29)
Creatinine: 1 mg/dL (ref 0.7–1.3)
Sodium: 139 mEq/L (ref 136–145)
Total Bilirubin: 1.86 mg/dL — ABNORMAL HIGH (ref 0.20–1.20)
Total Protein: 7.3 g/dL (ref 6.4–8.3)

## 2012-09-22 LAB — PSA: PSA: 0.73 ng/mL (ref <=4.00)

## 2012-09-22 MED ORDER — IOHEXOL 300 MG/ML  SOLN
100.0000 mL | Freq: Once | INTRAMUSCULAR | Status: AC | PRN
  Administered 2012-09-22: 100 mL via INTRAVENOUS

## 2012-09-22 MED ORDER — TECHNETIUM TC 99M MEDRONATE IV KIT
25.0000 | PACK | Freq: Once | INTRAVENOUS | Status: AC | PRN
  Administered 2012-09-22: 23.1 via INTRAVENOUS

## 2012-09-30 ENCOUNTER — Telehealth: Payer: Self-pay | Admitting: Oncology

## 2012-09-30 ENCOUNTER — Ambulatory Visit (HOSPITAL_BASED_OUTPATIENT_CLINIC_OR_DEPARTMENT_OTHER): Payer: BC Managed Care – PPO | Admitting: Oncology

## 2012-09-30 VITALS — BP 130/78 | HR 76 | Temp 97.0°F | Resp 20 | Ht 68.5 in | Wt 170.5 lb

## 2012-09-30 DIAGNOSIS — E291 Testicular hypofunction: Secondary | ICD-10-CM

## 2012-09-30 DIAGNOSIS — C61 Malignant neoplasm of prostate: Secondary | ICD-10-CM

## 2012-09-30 DIAGNOSIS — Z5111 Encounter for antineoplastic chemotherapy: Secondary | ICD-10-CM

## 2012-09-30 MED ORDER — LEUPROLIDE ACETATE (3 MONTH) 22.5 MG IM KIT
22.5000 mg | PACK | Freq: Once | INTRAMUSCULAR | Status: AC
  Administered 2012-09-30: 22.5 mg via INTRAMUSCULAR
  Filled 2012-09-30: qty 22.5

## 2012-09-30 NOTE — Telephone Encounter (Signed)
Gave pt appt for lab and MD on MAy 2014 °

## 2012-09-30 NOTE — Progress Notes (Signed)
Hematology and Oncology Follow Up Visit  David Huffman [id number] [birth year] 52 y.o. 09/30/2012 9:54 AM  CC: [name], MD    Principle Diagnosis: A 52 year old gentleman with prostate cancer diagnosed in March 2008, Gleason score 7, PSA of 31.  Prior Therapy: 1. Status post prostatectomy done in 2008, pathology revealing T2c N0 with 0 out of 12 lymph nodes involved, pathology revealed 4 + 3 equals 7 Gleason score.  2. The patient continued to have rise in his PSA up to 52 without any measurable disease.  Current therapy: He is on hormonal deprivation, Lupron 22.5 mg every 3 months on a continuous basis.  PSA nadir down to 0.4 and then rose up to 2.42 in 05/2011. Casodex 50 mg daily added in 05/2011.  Interim History:  David Huffman is a pleasant 52 year old gentleman with prostate cancer.  He has continued to PSA under control with Casodex that was added in 05/2011. He tolerated very well at this time. He has continued to be asymptomatic from prostate cancer.  He had not reported any chest pain.  Had not reported any back pain.  Had not reported any genitourinary complaints.  He does report some fatigue, but he is very active.  He works full-time and continues to bike on a regular basis.  But otherwise he had not really had any other decline in his energy or decline in his health.  Overall performance status and activity level is about the same.   Medications: I have reviewed the patient's current medications. Current outpatient prescriptions:Ascorbic Acid (VITAMIN C) 1000 MG tablet, Take 1,000 mg by mouth daily., Disp: , Rfl: ;  b complex vitamins tablet, Take 1 tablet by mouth daily., Disp: , Rfl: ;  bicalutamide (CASODEX) 50 MG tablet, Take 1 tablet (50 mg total) by mouth daily., Disp: 90 tablet, Rfl: 1;  leuprolide (LUPRON) 22.5 MG injection, Inject 22.5 mg into the muscle every 3 (three) months., Disp: , Rfl:  Multiple Vitamins-Minerals (MULTIVITAMIN PO), Take 1 tablet by mouth daily., Disp: ,  Rfl: ;  VITAMIN D, ERGOCALCIFEROL, PO, Take 1 tablet by mouth daily., Disp: , Rfl: ;  Calcium Carbonate-Vitamin D (CALCIUM + D PO), Take 1 tablet by mouth daily., Disp: , Rfl: ;  Lactase (LACTAID PO), Take by mouth daily as needed., Disp: , Rfl:   Allergies: No Known Allergies  Past Medical History, Surgical history, Social history, and Family History were reviewed and updated.  Review of Systems: Constitutional:  Negative for fever, chills, night sweats, anorexia, weight loss, pain. Cardiovascular: no chest pain or dyspnea on exertion Respiratory: no cough, shortness of breath, or wheezing Neurological: no TIA or stroke symptoms Dermatological: negative ENT: negative Skin: Negative. Gastrointestinal: no abdominal pain, change in bowel habits, or black or bloody stools Genito-Urinary: no dysuria, trouble voiding, or hematuria Hematological and Lymphatic: negative Breast: negative Musculoskeletal: negative Remaining ROS negative. Physical Exam: There were no vitals taken for this visit. ECOG: 0 General appearance: alert Head: Normocephalic, without obvious abnormality, atraumatic Neck: no adenopathy, no carotid bruit, no JVD, supple, symmetrical, trachea midline and thyroid not enlarged, symmetric, no tenderness/mass/nodules Lymph nodes: Cervical, supraclavicular, and axillary nodes normal. Heart:regular rate and rhythm, S1, S2 normal, no murmur, click, rub or gallop Lung:chest clear, no wheezing, rales, normal symmetric air entry Abdomin: soft, non-tender, without masses or organomegaly EXT:no erythema, induration, or nodules   Lab Results: Lab Results  Component Value Date   WBC 5.2 09/22/2012   HGB 14.7 09/22/2012   HCT 41.2 09/22/2012  MCV 87.0 09/22/2012   PLT 203 09/22/2012     Chemistry      Component Value Date/Time   NA 139 09/22/2012 0910   NA 139 03/15/2012 0911   NA 144 09/15/2011 0953   K 4.5 09/22/2012 0910   K 4.7 03/15/2012 0911   K 4.6 09/15/2011 0953   CL 103  09/22/2012 0910   CL 106 03/15/2012 0911   CL 103 09/15/2011 0953   CO2 27 09/22/2012 0910   CO2 26 03/15/2012 0911   CO2 28 09/15/2011 0953   BUN 14.8 09/22/2012 0910   BUN 15 03/15/2012 0911   BUN 15 09/15/2011 0953   CREATININE 1.0 09/22/2012 0910   CREATININE 0.92 03/15/2012 0911   CREATININE 0.9 09/15/2011 0953      Component Value Date/Time   CALCIUM 9.7 09/22/2012 0910   CALCIUM 9.5 03/15/2012 0911   CALCIUM 9.3 09/15/2011 0953   ALKPHOS 103 09/22/2012 0910   ALKPHOS 84 03/15/2012 0911   ALKPHOS 93* 09/15/2011 0953   AST 27 09/22/2012 0910   AST 107* 03/15/2012 0911   AST 60* 09/15/2011 0953   ALT 27 09/22/2012 0910   ALT 46 03/15/2012 0911   BILITOT 1.86* 09/22/2012 0910   BILITOT 1.4* 03/15/2012 0911   BILITOT 1.80* 09/15/2011 0953     Results for David Huffman, David Huffman (MRN [id number]) as of 09/30/2012 08:15  Ref. Range 06/17/2012 09:08 09/22/2012 09:10  PSA Latest Range: <=4.00 ng/mL 0.65 0.73   CT CHEST, ABDOMEN AND PELVIS WITH CONTRAST  Technique: Multidetector CT imaging of the chest, abdomen and  pelvis was performed following the standard protocol during bolus  administration of intravenous contrast.  Contrast: OMNIPAQUE IOHEXOL 300 MG/ML SOLN  Comparison: CT 09/15/2011  CT CHEST  Findings: No axillary or supraclavicular lymphadenopathy. No  mediastinal or hilar lymphadenopathy. No pericardial fluid.  Esophagus is normal. No pericardial fluid.  No suspicious pulmonary nodules. Airways are normal.  IMPRESSION:  No evidence of thoracic metastasis.  CT ABDOMEN AND PELVIS  Findings: Tiny hypodensity within the right hepatic lobe  measuring 6 mm are unchanged and likely represent a benign cysts.  Gallbladder, pancreas, spleen, adrenal glands, and kidneys are  normal.  The stomach, small bowel, appendix, and cecum are normal. The  colon and rectosigmoid colon are normal.  Abdominal aorta is normal caliber. No retroperitoneal  lymphadenopathy.  Prostate gland has been removed  surgically. The bladder is normal.  There are small external iliac lymph nodes not changed from prior.  For example 9 mm right external iliac lymph node is unchanged from  9 mm on prior. Similar left external iliac lymph node.  Review of the bone windows demonstrates no aggressive osseous  lesions.  IMPRESSION:  1. No evidence of local prostate cancer recurrence or metastasis  in the abdomen or pelvis.  2. Stable small external iliac lymph nodes.    NUCLEAR MEDICINE WHOLE BODY BONE SCINTIGRAPHY  Technique: Whole body anterior and posterior images were obtained  approximately 3 hours after intravenous injection of  radiopharmaceutical.  Radiopharmaceutical: 23.1MILLI CURIE TC-MDP TECHNETIUM TC 23M  MEDRONATE IV KIT  Comparison: Whole body bone scan 09/15/2011 and 10/21/2010.  Findings: The osseous activity is unchanged. There is no evidence  of metastatic disease. Mild degenerative changes in the spine and  both shoulders appear stable. The soft tissue activity appears  normal.  IMPRESSION:  Stable whole body bone scan. No evidence of osseous metastatic  prostate cancer.     Impression and Plan:  ASSESSMENT AND PLAN:  This is a pleasant 52 year old gentleman with the following issues:  1. 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 Casodex was added to his regimen in 05/2011. He tolerated well without any complications. PSA continues to be low and under control.  Imaging studies: Ct scan and bone scan was discussed today and did not show any measurable disease. The plan is to continue combined androgen deprivation with Lupron and  Casodex given his excellent PSA response. I will restage him with CT scan and bone scan in 08/2013. 2. Androgen deprivation.  Will continue Lupron today and every 3 months. Next dose will be in 12/2012 3. Bone health.  Continue calcium and vitamin D supplementation.   4. Followup will be in 3 months'  time.   [facility], MD 2/7/20149:54 AM

## 2012-10-14 ENCOUNTER — Other Ambulatory Visit: Payer: Self-pay | Admitting: Oncology

## 2012-10-14 ENCOUNTER — Encounter: Payer: Self-pay | Admitting: *Deleted

## 2012-10-14 DIAGNOSIS — C61 Malignant neoplasm of prostate: Secondary | ICD-10-CM

## 2012-10-14 MED ORDER — BICALUTAMIDE 50 MG PO TABS: 50.0000 mg | ORAL_TABLET | Freq: Every day | ORAL | Status: DC

## 2012-10-14 NOTE — Progress Notes (Signed)
F[facility]ed [facility]x [facility] @ [phone]

## 2013-01-03 ENCOUNTER — Other Ambulatory Visit (HOSPITAL_BASED_OUTPATIENT_CLINIC_OR_DEPARTMENT_OTHER): Payer: BC Managed Care – PPO

## 2013-01-03 DIAGNOSIS — C61 Malignant neoplasm of prostate: Secondary | ICD-10-CM

## 2013-01-03 LAB — CBC WITH DIFFERENTIAL/PLATELET
Basophils Absolute: 0 10*3/uL (ref 0.0–0.1)
HCT: 42.3 % (ref 38.4–49.9)
HGB: 14.6 g/dL (ref 13.0–17.1)
LYMPH%: 37 % (ref 14.0–49.0)
MCH: 29.8 pg (ref 27.2–33.4)
MCHC: 34.5 g/dL (ref 32.0–36.0)
MONO#: 0.6 10*3/uL (ref 0.1–0.9)
NEUT%: 51 % (ref 39.0–75.0)
Platelets: 226 10*3/uL (ref 140–400)
WBC: 6.2 10*3/uL (ref 4.0–10.3)
lymph#: 2.3 10*3/uL (ref 0.9–3.3)

## 2013-01-03 LAB — COMPREHENSIVE METABOLIC PANEL (CC13)
BUN: 16.5 mg/dL (ref 7.0–26.0)
CO2: 24 mEq/L (ref 22–29)
Calcium: 9.4 mg/dL (ref 8.4–10.4)
Chloride: 106 mEq/L (ref 98–107)
Creatinine: 1 mg/dL (ref 0.7–1.3)
Total Bilirubin: 1.84 mg/dL — ABNORMAL HIGH (ref 0.20–1.20)

## 2013-01-03 LAB — PSA: PSA: 0.73 ng/mL (ref <=4.00)

## 2013-01-06 ENCOUNTER — Telehealth: Payer: Self-pay | Admitting: Oncology

## 2013-01-06 ENCOUNTER — Ambulatory Visit (HOSPITAL_BASED_OUTPATIENT_CLINIC_OR_DEPARTMENT_OTHER): Payer: BC Managed Care – PPO | Admitting: Oncology

## 2013-01-06 VITALS — BP 116/72 | HR 56 | Temp 97.4°F | Resp 18 | Ht 68.0 in | Wt 168.3 lb

## 2013-01-06 DIAGNOSIS — Z5111 Encounter for antineoplastic chemotherapy: Secondary | ICD-10-CM

## 2013-01-06 DIAGNOSIS — C61 Malignant neoplasm of prostate: Secondary | ICD-10-CM

## 2013-01-06 MED ORDER — LEUPROLIDE ACETATE (3 MONTH) 22.5 MG IM KIT
22.5000 mg | PACK | Freq: Once | INTRAMUSCULAR | Status: AC
  Administered 2013-01-06: 22.5 mg via INTRAMUSCULAR
  Filled 2013-01-06: qty 22.5

## 2013-01-06 NOTE — Progress Notes (Signed)
Hematology and Oncology Follow Up Visit  David Huffman [id number] 02/09/1961 52 y.o. 01/06/2013 9:48 AM  CC: [name], MD    Principle Diagnosis: A 52 year old gentleman with prostate cancer diagnosed in March 2008, Gleason score 7, PSA of 31.  Prior Therapy: 1. Status post prostatectomy done in 2008, pathology revealing T2c N0 with 0 out of 12 lymph nodes involved, pathology revealed 4 + 3 equals 7 Gleason score.  2. The patient continued to have rise in his PSA up to 52 without any measurable disease.  Current therapy: He is on hormonal deprivation, Lupron 22.5 mg every 3 months on a continuous basis.  PSA nadir down to 0.4 and then rose up to 2.42 in 05/2011. Casodex 50 mg daily added in 05/2011.  Interim History:  [name] is a pleasant 52 year old gentleman with prostate cancer.  He has continued to PSA under control with Casodex that was added in 05/2011. He tolerated very well at this time. He has continued to be asymptomatic from prostate cancer.  He had not reported any chest pain.  Had not reported any back pain.  Had not reported any genitourinary complaints.  He does report some fatigue, but he is very active.  He works full-time and continues to bike on a regular basis.  But otherwise he had not really had any other decline in his energy or decline in his health.  Overall performance status and activity level is about the same. No new complications noted at this time.   Medications: I have reviewed the patient's current medications. Current outpatient prescriptions:Ascorbic Acid (VITAMIN C) 1000 MG tablet, Take 1,000 mg by mouth daily., Disp: , Rfl: ;  b complex vitamins tablet, Take 1 tablet by mouth daily., Disp: , Rfl: ;  bicalutamide (CASODEX) 50 MG tablet, Take 1 tablet (50 mg total) by mouth daily., Disp: 90 tablet, Rfl: 1;  Calcium Carbonate-Vitamin D (CALCIUM + D PO), Take 1 tablet by mouth daily., Disp: , Rfl:  Lactase (LACTAID PO), Take by mouth daily as needed., Disp: ,  Rfl: ;  leuprolide (LUPRON) 22.5 MG injection, Inject 22.5 mg into the muscle every 3 (three) months., Disp: , Rfl: ;  Multiple Vitamins-Minerals (MULTIVITAMIN PO), Take 1 tablet by mouth daily., Disp: , Rfl: ;  VITAMIN D, ERGOCALCIFEROL, PO, Take 1 tablet by mouth daily., Disp: , Rfl:   Allergies: No Known Allergies  Past Medical History, Surgical history, Social history, and Family History were reviewed and updated.  Review of Systems: Constitutional:  Negative for fever, chills, night sweats, anorexia, weight loss, pain. Cardiovascular: no chest pain or dyspnea on exertion Respiratory: no cough, shortness of breath, or wheezing Neurological: no TIA or stroke symptoms Dermatological: negative ENT: negative Skin: Negative. Gastrointestinal: no abdominal pain, change in bowel habits, or black or bloody stools Genito-Urinary: no dysuria, trouble voiding, or hematuria Hematological and Lymphatic: negative Breast: negative Musculoskeletal: negative Remaining ROS negative. Physical Exam: Blood pressure 116/72, pulse 56, temperature 97.4 F (36.3 C), temperature source Oral, resp. rate 18, height 5\' 8"  (1.727 m), weight 168 lb 4.8 oz (76.34 kg). ECOG: 0 General appearance: alert Head: Normocephalic, without obvious abnormality, atraumatic Neck: no adenopathy, no carotid bruit, no JVD, supple, symmetrical, trachea midline and thyroid not enlarged, symmetric, no tenderness/mass/nodules Lymph nodes: Cervical, supraclavicular, and axillary nodes normal. Heart:regular rate and rhythm, S1, S2 normal, no murmur, click, rub or gallop Lung:chest clear, no wheezing, rales, normal symmetric air entry Abdomin: soft, non-tender, without masses or organomegaly EXT:no erythema, induration, or nodules  Lab Results: Lab Results  Component Value Date   WBC 6.2 01/03/2013   HGB 14.6 01/03/2013   HCT 42.3 01/03/2013   MCV 86.6 01/03/2013   PLT 226 01/03/2013     Chemistry      Component Value  Date/Time   NA 139 01/03/2013 0916   NA 139 03/15/2012 0911   NA 144 09/15/2011 0953   K 4.2 01/03/2013 0916   K 4.7 03/15/2012 0911   K 4.6 09/15/2011 0953   CL 106 01/03/2013 0916   CL 106 03/15/2012 0911   CL 103 09/15/2011 0953   CO2 24 01/03/2013 0916   CO2 26 03/15/2012 0911   CO2 28 09/15/2011 0953   BUN 16.5 01/03/2013 0916   BUN 15 03/15/2012 0911   BUN 15 09/15/2011 0953   CREATININE 1.0 01/03/2013 0916   CREATININE 0.92 03/15/2012 0911   CREATININE 0.9 09/15/2011 0953      Component Value Date/Time   CALCIUM 9.4 01/03/2013 0916   CALCIUM 9.5 03/15/2012 0911   CALCIUM 9.3 09/15/2011 0953   ALKPHOS 96 01/03/2013 0916   ALKPHOS 84 03/15/2012 0911   ALKPHOS 93* 09/15/2011 0953   AST 33 01/03/2013 0916   AST 107* 03/15/2012 0911   AST 60* 09/15/2011 0953   ALT 30 01/03/2013 0916   ALT 46 03/15/2012 0911   BILITOT 1.84* 01/03/2013 0916   BILITOT 1.4* 03/15/2012 0911   BILITOT 1.80* 09/15/2011 0953     Results for COVE, David Huffman (MRN [id number]) as of 09/30/2012 08:15  Ref. Range 06/17/2012 09:08 09/22/2012 09:10  PSA Latest Range: <=4.00 ng/mL 0.65 0.73      Impression and Plan:   ASSESSMENT AND PLAN:  This is a pleasant 52 year old gentleman with the following issues:  1. 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 Casodex was added to his regimen in 05/2011. He tolerated well without any complications. PSA continues to be low and under control.  Imaging studies: Ct scan and bone scan in 08/2012 did not show any measurable disease. The plan is to continue combined androgen deprivation with Lupron and  Casodex given his excellent PSA response. I will restage him with CT scan and bone scan in 08/2013. 2. Androgen deprivation.  Will continue Lupron today and every 3 months. Next dose will be in 03/2013 3. Bone health.  Continue calcium and vitamin D supplementation.   4. Followup will be in 3 months' time.   [facility], MD 5/16/20149:48 AM

## 2013-01-06 NOTE — Telephone Encounter (Signed)
Gave pt appt for lab and MD on August, date was changed per pt rqst

## 2013-01-17 ENCOUNTER — Other Ambulatory Visit: Payer: Self-pay | Admitting: *Deleted

## 2013-01-17 DIAGNOSIS — C61 Malignant neoplasm of prostate: Secondary | ICD-10-CM

## 2013-01-17 MED ORDER — BICALUTAMIDE 50 MG PO TABS: 50.0000 mg | ORAL_TABLET | Freq: Every day | ORAL | Status: DC

## 2013-03-31 ENCOUNTER — Telehealth: Payer: Self-pay | Admitting: *Deleted

## 2013-03-31 NOTE — Telephone Encounter (Signed)
Lm informed the pt that [facility] will be out of the office on 04/21/13. gv appt d/t for 04/27/13@ 9am. Made the pt aware that i will mail  A letter/avs as well...td

## 2013-04-17 ENCOUNTER — Telehealth: Payer: Self-pay | Admitting: Oncology

## 2013-04-17 ENCOUNTER — Other Ambulatory Visit (HOSPITAL_BASED_OUTPATIENT_CLINIC_OR_DEPARTMENT_OTHER): Payer: BC Managed Care – PPO

## 2013-04-17 DIAGNOSIS — C61 Malignant neoplasm of prostate: Secondary | ICD-10-CM

## 2013-04-17 LAB — CBC WITH DIFFERENTIAL/PLATELET
BASO%: 0.4 % (ref 0.0–2.0)
Basophils Absolute: 0 10*3/uL (ref 0.0–0.1)
Eosinophils Absolute: 0.1 10*3/uL (ref 0.0–0.5)
HCT: 43.8 % (ref 38.4–49.9)
HGB: 15.1 g/dL (ref 13.0–17.1)
MONO#: 0.4 10*3/uL (ref 0.1–0.9)
MONO%: 6.1 % (ref 0.0–14.0)
NEUT#: 4.4 10*3/uL (ref 1.5–6.5)
RDW: 12.8 % (ref 11.0–14.6)
WBC: 7.2 10*3/uL (ref 4.0–10.3)
lymph#: 2.3 10*3/uL (ref 0.9–3.3)

## 2013-04-17 LAB — COMPREHENSIVE METABOLIC PANEL (CC13)
ALT: 37 U/L (ref 0–55)
Alkaline Phosphatase: 100 U/L (ref 40–150)
Creatinine: 0.9 mg/dL (ref 0.7–1.3)
Sodium: 143 mEq/L (ref 136–145)
Total Bilirubin: 1.5 mg/dL — ABNORMAL HIGH (ref 0.20–1.20)
Total Protein: 8 g/dL (ref 6.4–8.3)

## 2013-04-17 NOTE — Telephone Encounter (Signed)
pt called to r/s lab having car trouble.David KitchenMarland KitchenMarland KitchenDone

## 2013-04-18 ENCOUNTER — Other Ambulatory Visit: Payer: BC Managed Care – PPO

## 2013-04-21 ENCOUNTER — Ambulatory Visit: Payer: BC Managed Care – PPO | Admitting: Oncology

## 2013-04-27 ENCOUNTER — Ambulatory Visit (HOSPITAL_BASED_OUTPATIENT_CLINIC_OR_DEPARTMENT_OTHER): Payer: BC Managed Care – PPO | Admitting: Oncology

## 2013-04-27 ENCOUNTER — Telehealth: Payer: Self-pay | Admitting: Oncology

## 2013-04-27 VITALS — BP 113/75 | HR 59 | Temp 96.7°F | Resp 18 | Ht 68.0 in | Wt 166.5 lb

## 2013-04-27 DIAGNOSIS — E291 Testicular hypofunction: Secondary | ICD-10-CM

## 2013-04-27 DIAGNOSIS — Z5111 Encounter for antineoplastic chemotherapy: Secondary | ICD-10-CM

## 2013-04-27 DIAGNOSIS — C61 Malignant neoplasm of prostate: Secondary | ICD-10-CM

## 2013-04-27 DIAGNOSIS — R5381 Other malaise: Secondary | ICD-10-CM

## 2013-04-27 MED ORDER — LEUPROLIDE ACETATE (3 MONTH) 22.5 MG IM KIT
22.5000 mg | PACK | Freq: Once | INTRAMUSCULAR | Status: AC
  Administered 2013-04-27: 22.5 mg via INTRAMUSCULAR
  Filled 2013-04-27: qty 22.5

## 2013-04-27 NOTE — Progress Notes (Signed)
Hematology and Oncology Follow Up Visit  David Huffman [birth year] 52 y.o. 04/27/2013 9:33 AM  CC: David Heck, David Huffman    Principle Diagnosis: A 52 year old gentleman with prostate cancer diagnosed in March 2008, Gleason score 7, PSA of 31.  Prior Therapy: 1. Status post prostatectomy done in 2008, pathology revealing T2c N0 with 0 out of 12 lymph nodes involved, pathology revealed 4 + 3 equals 7 Gleason score.  2. The patient continued to have rise in his PSA up to 52 without any measurable disease.  Current therapy: He is on hormonal deprivation, Lupron 22.5 mg every 3 months on a continuous basis.  PSA nadir down to 0.4 and then rose up to 2.42 in 05/2011. Casodex 50 mg daily added in 05/2011.  Interim History:  David Huffman is a pleasant 52 year old gentleman with prostate cancer.  He has continued to PSA under control with Casodex that was added in 05/2011. He tolerated very well at this time. He has continued to be asymptomatic from prostate cancer.  He had not reported any chest pain.  Had not reported any back pain.  Had not reported any genitourinary complaints.  He does report some fatigue, but he is very active.  He works full-time and continues to bike on a regular basis.  But otherwise he had not really had any other decline in his energy or decline in his health.  Overall performance status and activity level is about the same. No new complications noted at this time. No bone pain or any new back pain.   Medications: I have reviewed the patient's current medications. Current Outpatient Prescriptions  Medication Sig Dispense Refill  . Ascorbic Acid (VITAMIN C) 1000 MG tablet Take 1,000 mg by mouth daily.      Marland Kitchen b complex vitamins tablet Take 1 tablet by mouth daily.      . bicalutamide (CASODEX) 50 MG tablet Take 1 tablet (50 mg total) by mouth daily.  90 tablet  1  . Calcium Carbonate-Vitamin D (CALCIUM + D PO) Take 1 tablet by mouth daily.      Marland Kitchen leuprolide (LUPRON) 22.5 MG  injection Inject 22.5 mg into the muscle every 3 (three) months.      . Multiple Vitamins-Minerals (MULTIVITAMIN PO) Take 1 tablet by mouth daily.      . Lactase (LACTAID PO) Take by mouth daily as needed.      Marland Kitchen VITAMIN D, ERGOCALCIFEROL, PO Take 1 tablet by mouth daily.       No current facility-administered medications for this visit.    Allergies: No Known Allergies  Past Medical History, Surgical history, Social history, and Family History were reviewed and updated.  Review of Systems:  Remaining ROS negative. Physical Exam: Blood pressure 113/75, pulse 59, temperature 96.7 F (35.9 C), temperature source Oral, resp. rate 18, height 5\' 8"  (1.727 m), weight 166 lb 8 oz (75.524 kg), SpO2 99.00%. ECOG: 0 General appearance: alert Head: Normocephalic, without obvious abnormality, atraumatic Neck: no adenopathy, no carotid bruit, no JVD, supple, symmetrical, trachea midline and thyroid not enlarged, symmetric, no tenderness/mass/nodules Lymph nodes: Cervical, supraclavicular, and axillary nodes normal. Heart:regular rate and rhythm, S1, S2 normal, no murmur, click, rub or gallop Lung:chest clear, no wheezing, rales, normal symmetric air entry Abdomin: soft, non-tender, without masses or organomegaly EXT:no erythema, induration, or nodules   Lab David: Lab David  Component Value Date   WBC 7.2 04/17/2013   HGB 15.1 04/17/2013   HCT 43.8 04/17/2013   MCV 87.3 04/17/2013  PLT 236 04/17/2013     Chemistry      Component Value Date/Time   NA 143 04/17/2013 1246   NA 139 03/15/2012 0911   NA 144 09/15/2011 0953   K 4.3 04/17/2013 1246   K 4.7 03/15/2012 0911   K 4.6 09/15/2011 0953   CL 106 01/03/2013 0916   CL 106 03/15/2012 0911   CL 103 09/15/2011 0953   CO2 26 04/17/2013 1246   CO2 26 03/15/2012 0911   CO2 28 09/15/2011 0953   BUN 13.5 04/17/2013 1246   BUN 15 03/15/2012 0911   BUN 15 09/15/2011 0953   CREATININE 0.9 04/17/2013 1246   CREATININE 0.92 03/15/2012 0911    CREATININE 0.9 09/15/2011 0953      Component Value Date/Time   CALCIUM 10.1 04/17/2013 1246   CALCIUM 9.5 03/15/2012 0911   CALCIUM 9.3 09/15/2011 0953   ALKPHOS 100 04/17/2013 1246   ALKPHOS 84 03/15/2012 0911   ALKPHOS 93* 09/15/2011 0953   AST 32 04/17/2013 1246   AST 107* 03/15/2012 0911   AST 60* 09/15/2011 0953   ALT 37 04/17/2013 1246   ALT 46 03/15/2012 0911   ALT 82* 09/15/2011 0953   BILITOT 1.50* 04/17/2013 1246   BILITOT 1.4* 03/15/2012 0911   BILITOT 1.80* 09/15/2011 0953       David Huffman, David Huffman (MRN Huffman) as of 04/27/2013 09:34  Ref. Range 09/22/2012 09:10 01/03/2013 09:16 04/17/2013 12:47  PSA Latest Range: <=4.00 ng/mL 0.73 0.73 0.75     Impression and Plan:   ASSESSMENT AND PLAN:  This is a pleasant 52 year old gentleman with the following issues:  1. 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 Casodex was added to his regimen in 05/2011. He tolerated well without any complications. PSA continues to be low and under control.  Imaging studies: Ct scan and bone scan in 08/2012 did not show any measurable disease. The plan is to continue combined androgen deprivation with Lupron and  Casodex given his excellent PSA response. I will restage him with CT scan and bone scan in 10/2013. 2. Androgen deprivation.  Will continue Lupron today and every 3 months. Next dose will be today and repeat in 07/2013. 3. Bone health.  Continue calcium and vitamin D supplementation.   4. Followup will be in 3 months' time.   [facility], David Huffman 9/4/20149:33 AM

## 2013-04-27 NOTE — Addendum Note (Signed)
Addended by: Kallie Locks on: 04/27/2013 09:50 AM   Modules accepted: Orders

## 2013-04-27 NOTE — Telephone Encounter (Signed)
gv adn printed appt sched and avs for pt for Dec..Marland Kitchenpt wanted friday appt

## 2013-07-18 ENCOUNTER — Other Ambulatory Visit: Payer: Self-pay | Admitting: *Deleted

## 2013-07-18 DIAGNOSIS — C61 Malignant neoplasm of prostate: Secondary | ICD-10-CM

## 2013-07-18 MED ORDER — BICALUTAMIDE 50 MG PO TABS: 50.0000 mg | ORAL_TABLET | Freq: Every day | ORAL | Status: DC

## 2013-07-25 ENCOUNTER — Encounter (INDEPENDENT_AMBULATORY_CARE_PROVIDER_SITE_OTHER): Payer: Self-pay

## 2013-07-25 ENCOUNTER — Other Ambulatory Visit (HOSPITAL_BASED_OUTPATIENT_CLINIC_OR_DEPARTMENT_OTHER): Payer: BC Managed Care – PPO

## 2013-07-25 DIAGNOSIS — C61 Malignant neoplasm of prostate: Secondary | ICD-10-CM

## 2013-07-25 LAB — CBC WITH DIFFERENTIAL/PLATELET
BASO%: 0.7 % (ref 0.0–2.0)
Basophils Absolute: 0 10*3/uL (ref 0.0–0.1)
HCT: 44.3 % (ref 38.4–49.9)
HGB: 15 g/dL (ref 13.0–17.1)
LYMPH%: 36.9 % (ref 14.0–49.0)
MCH: 30 pg (ref 27.2–33.4)
MCHC: 33.8 g/dL (ref 32.0–36.0)
MONO#: 0.4 10*3/uL (ref 0.1–0.9)
NEUT%: 51.2 % (ref 39.0–75.0)
Platelets: 207 10*3/uL (ref 140–400)
WBC: 5.1 10*3/uL (ref 4.0–10.3)
lymph#: 1.9 10*3/uL (ref 0.9–3.3)

## 2013-07-25 LAB — COMPREHENSIVE METABOLIC PANEL (CC13)
ALT: 34 U/L (ref 0–55)
Anion Gap: 10 mEq/L (ref 3–11)
BUN: 20.1 mg/dL (ref 7.0–26.0)
CO2: 24 mEq/L (ref 22–29)
Calcium: 10 mg/dL (ref 8.4–10.4)
Chloride: 106 mEq/L (ref 98–109)
Creatinine: 1 mg/dL (ref 0.7–1.3)
Glucose: 90 mg/dl (ref 70–140)
Total Bilirubin: 1.79 mg/dL — ABNORMAL HIGH (ref 0.20–1.20)

## 2013-07-25 LAB — PSA: PSA: 0.7 ng/mL (ref <=4.00)

## 2013-07-28 ENCOUNTER — Ambulatory Visit (HOSPITAL_BASED_OUTPATIENT_CLINIC_OR_DEPARTMENT_OTHER): Payer: BC Managed Care – PPO | Admitting: Oncology

## 2013-07-28 ENCOUNTER — Telehealth: Payer: Self-pay | Admitting: Oncology

## 2013-07-28 VITALS — BP 118/76 | HR 61 | Temp 97.9°F | Resp 20 | Ht 68.0 in | Wt 169.6 lb

## 2013-07-28 DIAGNOSIS — C61 Malignant neoplasm of prostate: Secondary | ICD-10-CM

## 2013-07-28 DIAGNOSIS — E291 Testicular hypofunction: Secondary | ICD-10-CM

## 2013-07-28 DIAGNOSIS — Z5111 Encounter for antineoplastic chemotherapy: Secondary | ICD-10-CM

## 2013-07-28 MED ORDER — LEUPROLIDE ACETATE (3 MONTH) 22.5 MG IM KIT
22.5000 mg | PACK | Freq: Once | INTRAMUSCULAR | Status: AC
  Administered 2013-07-28: 22.5 mg via INTRAMUSCULAR
  Filled 2013-07-28: qty 22.5

## 2013-07-28 NOTE — Telephone Encounter (Signed)
Gave pt appt for lab,md and chemo for March 2015, gave pt oral contrast

## 2013-07-28 NOTE — Progress Notes (Signed)
Hematology and Oncology Follow Up Visit  Rasheem Figiel [id number] [birth year] 52 y.o. 07/28/2013 8:52 AM  CC: [name], MD    Principle Diagnosis: A 52 year old gentleman with prostate cancer diagnosed in March 2008, Gleason score 7, PSA of 31.  Prior Therapy: 1. Status post prostatectomy done in 2008, pathology revealing T2c N0 with 0 out of 12 lymph nodes involved, pathology revealed 4 + 3 equals 7 Gleason score.  2. The patient continued to have rise in his PSA up to 52 without any measurable disease.  Current therapy: He is on hormonal deprivation, Lupron 22.5 mg every 3 months on a continuous basis.  PSA nadir down to 0.4 and then rose up to 2.42 in 05/2011. Casodex 50 mg daily added in 05/2011.  Interim History:  Mr. Vantine is a pleasant 52 year old gentleman with prostate cancer.  He has continued to PSA under control with Casodex that was added in 05/2011. He tolerated very well at this time. He has continued to be asymptomatic from prostate cancer.  He had not reported any chest pain.  Had not reported any back pain.  Had not reported any genitourinary complaints.  He does report some fatigue, but he is very active.  He works full-time and continues to bike on a regular basis.  But otherwise he had not really had any other decline in his energy or decline in his health.  Overall performance status and activity level is about the same. No new complications noted at this time. No bone pain or any new back pain. He reports no recent illnesses or hospitalizations.  Medications: I have reviewed the patient's current medications. Current Outpatient Prescriptions  Medication Sig Dispense Refill  . Ascorbic Acid (VITAMIN C) 1000 MG tablet Take 1,000 mg by mouth daily.      Marland Kitchen b complex vitamins tablet Take 1 tablet by mouth daily.      . bicalutamide (CASODEX) 50 MG tablet Take 1 tablet (50 mg total) by mouth daily.  90 tablet  1  . Calcium Carbonate-Vitamin D (CALCIUM + D PO) Take 1 tablet by  mouth daily.      . Lactase (LACTAID PO) Take by mouth daily as needed.      Marland Kitchen leuprolide (LUPRON) 22.5 MG injection Inject 22.5 mg into the muscle every 3 (three) months.      . Multiple Vitamins-Minerals (MULTIVITAMIN PO) Take 1 tablet by mouth daily.      Marland Kitchen VITAMIN D, ERGOCALCIFEROL, PO Take 1 tablet by mouth daily.       No current facility-administered medications for this visit.    Allergies: No Known Allergies  Past Medical History, Surgical history, Social history, and Family History were reviewed and updated.  Review of Systems:  Remaining ROS negative. Physical Exam: Blood pressure 118/76, pulse 61, temperature 97.9 F (36.6 C), temperature source Oral, resp. rate 20, height 5\' 8"  (1.727 m), weight 169 lb 9.6 oz (76.93 kg). ECOG: 0 General appearance: alert Head: Normocephalic, without obvious abnormality, atraumatic Neck: no adenopathy, no carotid bruit, no JVD, supple, symmetrical, trachea midline and thyroid not enlarged, symmetric, no tenderness/mass/nodules Lymph nodes: Cervical, supraclavicular, and axillary nodes normal. Heart:regular rate and rhythm, S1, S2 normal, no murmur, click, rub or gallop Lung:chest clear, no wheezing, rales, normal symmetric air entry Abdomin: soft, non-tender, without masses or organomegaly EXT:no erythema, induration, or nodules   Lab Results: Lab Results  Component Value Date   WBC 5.1 07/25/2013   HGB 15.0 07/25/2013   HCT 44.3 07/25/2013  MCV 88.9 07/25/2013   PLT 207 07/25/2013     Chemistry      Component Value Date/Time   NA 140 07/25/2013 0835   NA 139 03/15/2012 0911   NA 144 09/15/2011 0953   K 4.3 07/25/2013 0835   K 4.7 03/15/2012 0911   K 4.6 09/15/2011 0953   CL 106 01/03/2013 0916   CL 106 03/15/2012 0911   CL 103 09/15/2011 0953   CO2 24 07/25/2013 0835   CO2 26 03/15/2012 0911   CO2 28 09/15/2011 0953   BUN 20.1 07/25/2013 0835   BUN 15 03/15/2012 0911   BUN 15 09/15/2011 0953   CREATININE 1.0 07/25/2013 0835    CREATININE 0.92 03/15/2012 0911   CREATININE 0.9 09/15/2011 0953      Component Value Date/Time   CALCIUM 10.0 07/25/2013 0835   CALCIUM 9.5 03/15/2012 0911   CALCIUM 9.3 09/15/2011 0953   ALKPHOS 104 07/25/2013 0835   ALKPHOS 84 03/15/2012 0911   ALKPHOS 93* 09/15/2011 0953   AST 32 07/25/2013 0835   AST 107* 03/15/2012 0911   AST 60* 09/15/2011 0953   ALT 34 07/25/2013 0835   ALT 46 03/15/2012 0911   ALT 82* 09/15/2011 0953   BILITOT 1.79* 07/25/2013 0835   BILITOT 1.4* 03/15/2012 0911   BILITOT 1.80* 09/15/2011 0953       Results for JAMEN, LOISEAU (MRN [id number]) as of 07/28/2013 08:29  Ref. Range 01/03/2013 09:16 04/17/2013 12:47 07/25/2013 08:35  PSA Latest Range: <=4.00 ng/mL 0.73 0.75 0.70      Impression and Plan:   ASSESSMENT AND PLAN:  This is a pleasant 52 year old gentleman with the following issues:  1. 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 Casodex was added to his regimen in 05/2011. He tolerated well without any complications. PSA continues to be low and under control.  Imaging studies: Ct scan and bone scan in 08/2012 did not show any measurable disease. The plan is to continue combined androgen deprivation with Lupron and  Casodex given his excellent PSA response. I will restage him with CT scan and bone scan in 10/2013. 2. Androgen deprivation.  Will continue Lupron today and every 3 months. Next dose will be today and repeat in 10/2013. 3. Bone health.  Continue calcium and vitamin D supplementation.   4. Followup will be in 3 months' time.   [facility], MD 12/5/20148:52 AM

## 2013-10-26 ENCOUNTER — Encounter (HOSPITAL_COMMUNITY)
Admission: RE | Admit: 2013-10-26 | Discharge: 2013-10-26 | Disposition: A | Discharge status: Home / Self Care | Payer: BC Managed Care – PPO | Source: Ambulatory Visit | Attending: Oncology | Admitting: Oncology

## 2013-10-26 ENCOUNTER — Encounter (HOSPITAL_COMMUNITY): Payer: Self-pay

## 2013-10-26 ENCOUNTER — Other Ambulatory Visit (HOSPITAL_BASED_OUTPATIENT_CLINIC_OR_DEPARTMENT_OTHER): Payer: BC Managed Care – PPO

## 2013-10-26 ENCOUNTER — Ambulatory Visit (HOSPITAL_COMMUNITY)
Admission: RE | Admit: 2013-10-26 | Discharge: 2013-10-26 | Disposition: A | Discharge status: Home / Self Care | Payer: BC Managed Care – PPO | Source: Ambulatory Visit | Attending: Oncology | Admitting: Oncology

## 2013-10-26 DIAGNOSIS — C61 Malignant neoplasm of prostate: Secondary | ICD-10-CM | POA: Insufficient documentation

## 2013-10-26 LAB — COMPREHENSIVE METABOLIC PANEL (CC13)
ALT: 36 U/L (ref 0–55)
ANION GAP: 8 meq/L (ref 3–11)
AST: 35 U/L — AB (ref 5–34)
Albumin: 4.4 g/dL (ref 3.5–5.0)
Alkaline Phosphatase: 102 U/L (ref 40–150)
BILIRUBIN TOTAL: 1.96 mg/dL — AB (ref 0.20–1.20)
BUN: 15.4 mg/dL (ref 7.0–26.0)
CO2: 27 meq/L (ref 22–29)
CREATININE: 1 mg/dL (ref 0.7–1.3)
Calcium: 10.1 mg/dL (ref 8.4–10.4)
Chloride: 106 mEq/L (ref 98–109)
Glucose: 87 mg/dl (ref 70–140)
Potassium: 4.7 mEq/L (ref 3.5–5.1)
Sodium: 141 mEq/L (ref 136–145)
Total Protein: 7.6 g/dL (ref 6.4–8.3)

## 2013-10-26 LAB — CBC WITH DIFFERENTIAL/PLATELET
BASO%: 0.9 % (ref 0.0–2.0)
Basophils Absolute: 0 10*3/uL (ref 0.0–0.1)
EOS%: 3.3 % (ref 0.0–7.0)
Eosinophils Absolute: 0.2 10*3/uL (ref 0.0–0.5)
HEMATOCRIT: 44.3 % (ref 38.4–49.9)
HGB: 15.3 g/dL (ref 13.0–17.1)
LYMPH%: 39.9 % (ref 14.0–49.0)
MCH: 30.1 pg (ref 27.2–33.4)
MCHC: 34.5 g/dL (ref 32.0–36.0)
MCV: 87.3 fL (ref 79.3–98.0)
MONO#: 0.5 10*3/uL (ref 0.1–0.9)
MONO%: 10 % (ref 0.0–14.0)
NEUT#: 2.3 10*3/uL (ref 1.5–6.5)
NEUT%: 45.9 % (ref 39.0–75.0)
PLATELETS: 219 10*3/uL (ref 140–400)
RBC: 5.07 10*6/uL (ref 4.20–5.82)
RDW: 13.1 % (ref 11.0–14.6)
WBC: 5 10*3/uL (ref 4.0–10.3)
lymph#: 2 10*3/uL (ref 0.9–3.3)

## 2013-10-26 MED ORDER — IOHEXOL 300 MG/ML  SOLN
100.0000 mL | Freq: Once | INTRAMUSCULAR | Status: AC | PRN
  Administered 2013-10-26: 100 mL via INTRAVENOUS

## 2013-10-26 MED ORDER — TECHNETIUM TC 99M MEDRONATE IV KIT
26.0000 | PACK | Freq: Once | INTRAVENOUS | Status: AC | PRN
  Administered 2013-10-26: 26 via INTRAVENOUS

## 2013-10-27 ENCOUNTER — Telehealth: Payer: Self-pay | Admitting: Oncology

## 2013-10-27 ENCOUNTER — Other Ambulatory Visit: Payer: Self-pay | Admitting: Medical Oncology

## 2013-10-27 ENCOUNTER — Encounter: Payer: Self-pay | Admitting: Oncology

## 2013-10-27 ENCOUNTER — Encounter: Payer: Self-pay | Admitting: Gastroenterology

## 2013-10-27 ENCOUNTER — Ambulatory Visit (HOSPITAL_BASED_OUTPATIENT_CLINIC_OR_DEPARTMENT_OTHER): Payer: BC Managed Care – PPO | Admitting: Oncology

## 2013-10-27 VITALS — BP 138/75 | HR 70 | Temp 97.4°F | Resp 18 | Ht 68.0 in | Wt 168.6 lb

## 2013-10-27 DIAGNOSIS — Z5111 Encounter for antineoplastic chemotherapy: Secondary | ICD-10-CM

## 2013-10-27 DIAGNOSIS — C61 Malignant neoplasm of prostate: Secondary | ICD-10-CM

## 2013-10-27 DIAGNOSIS — Z1211 Encounter for screening for malignant neoplasm of colon: Secondary | ICD-10-CM

## 2013-10-27 MED ORDER — BICALUTAMIDE 50 MG PO TABS: 50.0000 mg | ORAL_TABLET | Freq: Every day | ORAL | Status: DC

## 2013-10-27 MED ORDER — LEUPROLIDE ACETATE (3 MONTH) 22.5 MG IM KIT
22.5000 mg | PACK | Freq: Once | INTRAMUSCULAR | Status: AC
  Administered 2013-10-27: 22.5 mg via INTRAMUSCULAR
  Filled 2013-10-27: qty 22.5

## 2013-10-27 NOTE — Progress Notes (Signed)
Hematology and Oncology Follow Up Visit  David Huffman [id number] 06/25/1961 53 y.o. 10/27/2013 8:47 AM  CC: [name], MD    Principle Diagnosis: A 53 year old gentleman with prostate cancer diagnosed in March 2008, Gleason score 7, PSA of 31.  Prior Therapy: 1. Status post prostatectomy done in 2008, pathology revealing T2c N0 with 0 out of 12 lymph nodes involved, pathology revealed 4 + 3 equals 7 Gleason score.  2. The patient continued to have rise in his PSA up to 52 without any measurable disease.  Current therapy: He is on hormonal deprivation, Lupron 22.5 mg every 3 months on a continuous basis.  PSA nadir down to 0.4 and then rose up to 2.42 in 05/2011. Casodex 50 mg daily added in 05/2011.  Interim History:  David Huffman is a pleasant 53 year old gentleman with prostate cancer. His PSA continues to be under control with Casodex that was added in 05/2011. He tolerated very well at this time. He has continued to be asymptomatic from prostate cancer.  He had not reported any chest pain.  Had not reported any back pain.  Had not reported any genitourinary complaints.  He does report some fatigue, but he is very active.  He works full-time and continues to bike on a regular basis.  But otherwise he had not really had any other decline in his energy or decline in his health. He has not reported any new illnesses or hospitalizations. No new complications related to androgen deprivation.  Medications: I have reviewed the patient's current medications. Current Outpatient Prescriptions  Medication Sig Dispense Refill  . Ascorbic Acid (VITAMIN C) 1000 MG tablet Take 1,000 mg by mouth daily.      Marland Kitchen b complex vitamins tablet Take 1 tablet by mouth daily.      . bicalutamide (CASODEX) 50 MG tablet Take 1 tablet (50 mg total) by mouth daily.  90 tablet  1  . Calcium Carbonate-Vitamin D (CALCIUM + D PO) Take 1 tablet by mouth daily.      . Lactase (LACTAID PO) Take by mouth daily as needed.      Marland Kitchen  leuprolide (LUPRON) 22.5 MG injection Inject 22.5 mg into the muscle every 3 (three) months.      . Multiple Vitamins-Minerals (MULTIVITAMIN PO) Take 1 tablet by mouth daily.      Marland Kitchen VITAMIN D, ERGOCALCIFEROL, PO Take 1 tablet by mouth daily.       Current Facility-Administered Medications  Medication Dose Route Frequency Provider Last Rate Last Dose  . leuprolide (LUPRON) injection 22.5 mg  22.5 mg Intramuscular Once Wyatt Portela, MD        Allergies: No Known Allergies  Past Medical History, Surgical history, Social history, and Family History were reviewed and updated.  Review of Systems:  Remaining ROS negative. Physical Exam: Blood pressure 138/75, pulse 70, temperature 97.4 F (36.3 C), temperature source Oral, resp. rate 18, height 5\' 8"  (1.727 m), weight 168 lb 9.6 oz (76.476 kg), SpO2 100.00%. ECOG: 0 General appearance: alert Head: Normocephalic, without obvious abnormality, atraumatic Neck: no adenopathy, no carotid bruit, no JVD, supple, symmetrical, trachea midline and thyroid not enlarged, symmetric, no tenderness/mass/nodules Lymph nodes: Cervical, supraclavicular, and axillary nodes normal. Heart:regular rate and rhythm, S1, S2 normal, no murmur, click, rub or gallop Lung:chest clear, no wheezing, rales, normal symmetric air entry Abdomin: soft, non-tender, without masses or organomegaly EXT:no erythema, induration, or nodules   Lab Results: Lab Results  Component Value Date   WBC 5.0 10/26/2013  HGB 15.3 10/26/2013   HCT 44.3 10/26/2013   MCV 87.3 10/26/2013   PLT 219 10/26/2013     Chemistry      Component Value Date/Time   NA 141 10/26/2013 0806   NA 139 03/15/2012 0911   NA 144 09/15/2011 0953   K 4.7 10/26/2013 0806   K 4.7 03/15/2012 0911   K 4.6 09/15/2011 0953   CL 106 01/03/2013 0916   CL 106 03/15/2012 0911   CL 103 09/15/2011 0953   CO2 27 10/26/2013 0806   CO2 26 03/15/2012 0911   CO2 28 09/15/2011 0953   BUN 15.4 10/26/2013 0806   BUN 15 03/15/2012 0911    BUN 15 09/15/2011 0953   CREATININE 1.0 10/26/2013 0806   CREATININE 0.92 03/15/2012 0911   CREATININE 0.9 09/15/2011 0953      Component Value Date/Time   CALCIUM 10.1 10/26/2013 0806   CALCIUM 9.5 03/15/2012 0911   CALCIUM 9.3 09/15/2011 0953   ALKPHOS 102 10/26/2013 0806   ALKPHOS 84 03/15/2012 0911   ALKPHOS 93* 09/15/2011 0953   AST 35* 10/26/2013 0806   AST 107* 03/15/2012 0911   AST 60* 09/15/2011 0953   ALT 36 10/26/2013 0806   ALT 46 03/15/2012 0911   ALT 82* 09/15/2011 0953   BILITOT 1.96* 10/26/2013 0806   BILITOT 1.4* 03/15/2012 0911   BILITOT 1.80* 09/15/2011 0953       EXAM:  CT CHEST, ABDOMEN, AND PELVIS WITH CONTRAST  TECHNIQUE:  Multidetector CT imaging of the chest, abdomen and pelvis was  performed following the standard protocol during bolus  administration of intravenous contrast.  CONTRAST: 177mL OMNIPAQUE IOHEXOL 300 MG/ML SOLN  COMPARISON: NM BONE WHOLE BODY dated 09/22/2012; CT ABD/PELVIS W CM  dated 09/22/2012; CT ABD/PELVIS W CM dated 09/15/2011  FINDINGS:  CT CHEST FINDINGS  Lungs/Pleura: No nodules or airspace opacities. No pleural fluid.  Heart/Mediastinum: No supraclavicular adenopathy. Normal heart size,  without pericardial effusion. No central pulmonary embolism, on this  non-dedicated study. No mediastinal or hilar adenopathy.  CT ABDOMEN AND PELVIS FINDINGS  Abdomen/Pelvis: Tiny cyst in the medial segment left liver lobe is  unchanged. A tiny splenule. Normal stomach, pancreas, gallbladder,  biliary tract, adrenal glands, kidneys. Circumaortic left renal  vein. No retroperitoneal or retrocrural adenopathy. Normal colon,  appendix, and terminal ileum. Normal small bowel without abdominal  ascites.  Right external iliac node measures 1.0 cm on image 114, similar to 9  mm on the prior. A left external iliac node measures 8 mm on image  116 versus 9 mm on the prior. Normal urinary bladder.  Prostatectomy, without locally recurrent disease or significant free   pelvic fluid.  Bones/Musculoskeletal: Scattered small vertebral hemangiomas.  Seventh anterolateral left rib bone island is unchanged.  IMPRESSION:  1. Status post prostatectomy, without evidence of locally recurrent  or metastatic disease.  2. Similar small pelvic nodes, likely reactive.  Electronically Signed  EXAM:  NUCLEAR MEDICINE WHOLE BODY BONE SCAN  TECHNIQUE:  Whole body anterior and posterior images were obtained approximately  3 hours after intravenous injection of radiopharmaceutical.  COMPARISON: NM BONE WHOLE BODY dated 09/22/2012  RADIOPHARMACEUTICALS: 26.0 of Technetium-99 MDP  FINDINGS:  No abnormal accumulation of radiotracer within the axillary or  appendicular skeleton to suggest skeletal metastasis.  IMPRESSION:  No scintigraphic evidence of skeletal metastasis.       Impression and Plan:   ASSESSMENT AND PLAN:  This is a pleasant 53 year old gentleman with the following issues:  1.  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 Casodex was added to his regimen in 05/2011. He tolerated well without any complications. PSA continues to be low and under control.  Imaging studies: Ct scan and bone scan done in 10/2013 were reviewed today and did not show any measurable disease. The plan is to continue combined androgen deprivation with Lupron and  Casodex given his excellent PSA response. I will restage him with CT scan and bone scan in 10/2014. 2. Androgen deprivation.  Will continue Lupron today and every 3 months. Next dose will be today and repeat in 10/2013. 3. Bone health.  Continue calcium and vitamin D supplementation.   4. Screening colonoscopy: He have asked to be referred to gastroenterology for a screening colonoscopy which we will do that today. 5. Followup will be in 3 months' time.   [facility], MD 3/6/20158:47 AM

## 2013-10-27 NOTE — Telephone Encounter (Signed)
Gave pt appt for lab and MD for June and GI appt  appt for April and May 2015

## 2013-12-01 ENCOUNTER — Other Ambulatory Visit: Payer: Self-pay | Admitting: Oncology

## 2013-12-01 DIAGNOSIS — Z1211 Encounter for screening for malignant neoplasm of colon: Secondary | ICD-10-CM

## 2013-12-07 ENCOUNTER — Telehealth: Payer: Self-pay | Admitting: Oncology

## 2013-12-07 NOTE — Telephone Encounter (Signed)
s/w pt re appt for 12/29/13 @ 8:30am at [name] w/gi nurse. office will schedule colonoscopy w/pt. s/w coutney at office. no other orders per 4/10 pof.

## 2013-12-22 ENCOUNTER — Encounter: Payer: BC Managed Care – PPO | Admitting: Gastroenterology

## 2014-01-29 ENCOUNTER — Other Ambulatory Visit (HOSPITAL_BASED_OUTPATIENT_CLINIC_OR_DEPARTMENT_OTHER): Payer: BC Managed Care – PPO

## 2014-01-29 DIAGNOSIS — Z1211 Encounter for screening for malignant neoplasm of colon: Secondary | ICD-10-CM

## 2014-01-29 DIAGNOSIS — C61 Malignant neoplasm of prostate: Secondary | ICD-10-CM

## 2014-01-29 LAB — COMPREHENSIVE METABOLIC PANEL (CC13)
ALBUMIN: 4 g/dL (ref 3.5–5.0)
ALT: 26 U/L (ref 0–55)
AST: 27 U/L (ref 5–34)
Alkaline Phosphatase: 94 U/L (ref 40–150)
Anion Gap: 13 mEq/L — ABNORMAL HIGH (ref 3–11)
BUN: 21.8 mg/dL (ref 7.0–26.0)
CHLORIDE: 108 meq/L (ref 98–109)
CO2: 22 meq/L (ref 22–29)
Calcium: 9.6 mg/dL (ref 8.4–10.4)
Creatinine: 0.9 mg/dL (ref 0.7–1.3)
GLUCOSE: 68 mg/dL — AB (ref 70–140)
POTASSIUM: 4.6 meq/L (ref 3.5–5.1)
Sodium: 143 mEq/L (ref 136–145)
TOTAL PROTEIN: 7.2 g/dL (ref 6.4–8.3)
Total Bilirubin: 1.48 mg/dL — ABNORMAL HIGH (ref 0.20–1.20)

## 2014-01-29 LAB — CBC WITH DIFFERENTIAL/PLATELET
BASO%: 0.7 % (ref 0.0–2.0)
Basophils Absolute: 0 10*3/uL (ref 0.0–0.1)
EOS%: 3.7 % (ref 0.0–7.0)
Eosinophils Absolute: 0.2 10*3/uL (ref 0.0–0.5)
HCT: 44.1 % (ref 38.4–49.9)
HGB: 14.8 g/dL (ref 13.0–17.1)
LYMPH#: 2.1 10*3/uL (ref 0.9–3.3)
LYMPH%: 42.2 % (ref 14.0–49.0)
MCH: 29.8 pg (ref 27.2–33.4)
MCHC: 33.5 g/dL (ref 32.0–36.0)
MCV: 88.9 fL (ref 79.3–98.0)
MONO#: 0.4 10*3/uL (ref 0.1–0.9)
MONO%: 7.9 % (ref 0.0–14.0)
NEUT#: 2.3 10*3/uL (ref 1.5–6.5)
NEUT%: 45.5 % (ref 39.0–75.0)
Platelets: 210 10*3/uL (ref 140–400)
RBC: 4.96 10*6/uL (ref 4.20–5.82)
RDW: 12.6 % (ref 11.0–14.6)
WBC: 5 10*3/uL (ref 4.0–10.3)

## 2014-01-29 LAB — PSA: PSA: 1.29 ng/mL (ref <=4.00)

## 2014-01-30 ENCOUNTER — Encounter: Payer: Self-pay | Admitting: Oncology

## 2014-01-30 ENCOUNTER — Ambulatory Visit (HOSPITAL_BASED_OUTPATIENT_CLINIC_OR_DEPARTMENT_OTHER): Payer: BC Managed Care – PPO | Admitting: Oncology

## 2014-01-30 ENCOUNTER — Telehealth: Payer: Self-pay | Admitting: Hematology and Oncology

## 2014-01-30 ENCOUNTER — Other Ambulatory Visit: Payer: Self-pay | Admitting: *Deleted

## 2014-01-30 VITALS — BP 120/72 | HR 61 | Temp 97.5°F | Resp 18 | Ht 68.0 in | Wt 164.0 lb

## 2014-01-30 DIAGNOSIS — Z5111 Encounter for antineoplastic chemotherapy: Secondary | ICD-10-CM

## 2014-01-30 DIAGNOSIS — C61 Malignant neoplasm of prostate: Secondary | ICD-10-CM

## 2014-01-30 MED ORDER — BICALUTAMIDE 50 MG PO TABS: 50.0000 mg | ORAL_TABLET | Freq: Every day | ORAL | Status: DC

## 2014-01-30 MED ORDER — LEUPROLIDE ACETATE (3 MONTH) 22.5 MG IM KIT
22.5000 mg | PACK | Freq: Once | INTRAMUSCULAR | Status: AC
  Administered 2014-01-30: 22.5 mg via INTRAMUSCULAR
  Filled 2014-01-30: qty 22.5

## 2014-01-30 NOTE — Telephone Encounter (Signed)
Gave pt appt for lab and MD for September °

## 2014-01-30 NOTE — Progress Notes (Signed)
Hematology and Oncology Follow Up Visit  David Huffman [id number] Jan 09, 1961 53 y.o. 01/30/2014 9:49 AM  CC: David Messick, MD    Principle Diagnosis: A 53 year old gentleman with prostate cancer diagnosed in March 2008, Gleason score 7, PSA of 31.  Prior Therapy: 1. Status post prostatectomy done in 2008, pathology revealing T2c N0 with 0 out of 12 lymph nodes involved, pathology revealed 4 + 3 equals 7 Gleason score.  2. The patient continued to have rise in his PSA up to 52 without any measurable disease.  Current therapy: He is on hormonal deprivation, Lupron 22.5 mg every 3 months on a continuous basis.  PSA nadir down to 0.4 and then rose up to 2.42 in 05/2011. Casodex 50 mg daily added in 05/2011.  Interim History:  David Huffman presents today for a followup visit by himself. He continues to tolerate Casodex well at this time. He has continued to be asymptomatic from prostate cancer.  He had not reported any chest pain.  Had not reported any back pain.  Had not reported any genitourinary complaints.  He does report some fatigue, but he is very active.  He works full-time and continues to bike on a regular basis.  He had not really had any other decline in his energy or decline in his health. He has not reported any new illnesses or hospitalizations. No new complications related to androgen deprivation. He has been swimming regularly and have complained of your pain. He does not report any headaches or blurry vision or drainage from his ear. Is not reporting any sinus congestion or fevers. Does not report any change in his bowel habits or nausea or vomiting. Did not report any neurological symptoms. Does not report any frequency urgency or hesitancy. Rest of his review of systems unremarkable.  Medications: I have reviewed the patient's current medications. Current Outpatient Prescriptions  Medication Sig Dispense Refill  . Ascorbic Acid (VITAMIN C) 1000 MG tablet Take 1,000 mg by mouth daily.       Marland Kitchen b complex vitamins tablet Take 1 tablet by mouth daily.      . bicalutamide (CASODEX) 50 MG tablet Take 1 tablet (50 mg total) by mouth daily.  90 tablet  1  . Calcium Carbonate-Vitamin D (CALCIUM + D PO) Take 1 tablet by mouth daily.      . Lactase (LACTAID PO) Take by mouth daily as needed.      Marland Kitchen leuprolide (LUPRON) 22.5 MG injection Inject 22.5 mg into the muscle every 3 (three) months.      . Multiple Vitamins-Minerals (MULTIVITAMIN PO) Take 1 tablet by mouth daily.      Marland Kitchen VITAMIN D, ERGOCALCIFEROL, PO Take 1 tablet by mouth daily.       No current facility-administered medications for this visit.    Allergies: No Known Allergies  Past Medical History, Surgical history, Social history, and Family History were reviewed and updated.   Physical Exam: Blood pressure 120/72, pulse 61, temperature 97.5 F (36.4 C), temperature source Oral, resp. rate 18, height 5\' 8"  (1.727 m), weight 164 lb (74.39 kg), SpO2 99.00%. ECOG: 0 General appearance: alert awake not in any distress. Head: Normocephalic, without obvious abnormality, atraumatic Neck: no adenopathy Lymph nodes: Cervical, supraclavicular, and axillary nodes normal. Heart:regular rate and rhythm, S1, S2 normal, no murmur, click, rub or gallop Lung:chest clear, no wheezing, rales, normal symmetric air entry Abdomin: soft, non-tender, without masses or organomegaly EXT:no erythema, induration, or nodules Examination of his left ear: Showed no evidence  of drainage or otitis externa.  Lab Results: Lab Results  Component Value Date   WBC 5.0 01/29/2014   HGB 14.8 01/29/2014   HCT 44.1 01/29/2014   MCV 88.9 01/29/2014   PLT 210 01/29/2014     Chemistry      Component Value Date/Time   NA 143 01/29/2014 0821   NA 139 03/15/2012 0911   NA 144 09/15/2011 0953   K 4.6 01/29/2014 0821   K 4.7 03/15/2012 0911   K 4.6 09/15/2011 0953   CL 106 01/03/2013 0916   CL 106 03/15/2012 0911   CL 103 09/15/2011 0953   CO2 22 01/29/2014 0821   CO2 26  03/15/2012 0911   CO2 28 09/15/2011 0953   BUN 21.8 01/29/2014 0821   BUN 15 03/15/2012 0911   BUN 15 09/15/2011 0953   CREATININE 0.9 01/29/2014 0821   CREATININE 0.92 03/15/2012 0911   CREATININE 0.9 09/15/2011 0953      Component Value Date/Time   CALCIUM 9.6 01/29/2014 0821   CALCIUM 9.5 03/15/2012 0911   CALCIUM 9.3 09/15/2011 0953   ALKPHOS 94 01/29/2014 0821   ALKPHOS 84 03/15/2012 0911   ALKPHOS 93* 09/15/2011 0953   AST 27 01/29/2014 0821   AST 107* 03/15/2012 0911   AST 60* 09/15/2011 0953   ALT 26 01/29/2014 0821   ALT 46 03/15/2012 0911   ALT 82* 09/15/2011 0953   BILITOT 1.48* 01/29/2014 0821   BILITOT 1.4* 03/15/2012 0911   BILITOT 1.80* 09/15/2011 0953      Results for David Huffman (MRN [id number]) as of 01/30/2014 09:09  Ref. Range 07/25/2013 08:35 01/29/2014 08:21  PSA Latest Range: <=4.00 ng/mL 0.70 1.29     Impression and Plan:   ASSESSMENT AND PLAN:  This is a pleasant 53 year old gentleman with the following issues:  1. 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 Casodex was added to his regimen in 05/2011. He tolerated well without any complications. PSA while he increase up to 1.29. His imaging studies were done in March of 2015 and showed no cancer relapse. I will continue to monitor him closely and repeat his PSA in 3 months. If his PSA continues to rise, we will consider second line hormonal manipulation at that time. 2. Androgen deprivation.  Will continue Lupron today and every 3 months. Next dose will be today and repeat in 04/2014. 3. Bone health.  Continue calcium and vitamin D supplementation.   4. Screening colonoscopy: This was arranged for them prior visits. 5. Followup will be in 3 months' time.   David Portela, MD 6/9/20159:49 AM

## 2014-04-18 ENCOUNTER — Telehealth: Payer: Self-pay | Admitting: Oncology

## 2014-04-18 NOTE — Telephone Encounter (Signed)
Pt cld to r/s later in the morning......kJ

## 2014-04-27 ENCOUNTER — Other Ambulatory Visit (HOSPITAL_BASED_OUTPATIENT_CLINIC_OR_DEPARTMENT_OTHER): Payer: BC Managed Care – PPO

## 2014-04-27 DIAGNOSIS — C61 Malignant neoplasm of prostate: Secondary | ICD-10-CM

## 2014-04-27 LAB — CBC WITH DIFFERENTIAL/PLATELET
BASO%: 0.8 % (ref 0.0–2.0)
Basophils Absolute: 0 10*3/uL (ref 0.0–0.1)
EOS%: 2.1 % (ref 0.0–7.0)
Eosinophils Absolute: 0.1 10*3/uL (ref 0.0–0.5)
HCT: 46 % (ref 38.4–49.9)
HGB: 15.6 g/dL (ref 13.0–17.1)
LYMPH%: 42 % (ref 14.0–49.0)
MCH: 29.7 pg (ref 27.2–33.4)
MCHC: 33.8 g/dL (ref 32.0–36.0)
MCV: 87.7 fL (ref 79.3–98.0)
MONO#: 0.4 10*3/uL (ref 0.1–0.9)
MONO%: 7.2 % (ref 0.0–14.0)
NEUT#: 2.9 10*3/uL (ref 1.5–6.5)
NEUT%: 47.9 % (ref 39.0–75.0)
PLATELETS: 228 10*3/uL (ref 140–400)
RBC: 5.25 10*6/uL (ref 4.20–5.82)
RDW: 13.1 % (ref 11.0–14.6)
WBC: 6 10*3/uL (ref 4.0–10.3)
lymph#: 2.5 10*3/uL (ref 0.9–3.3)

## 2014-04-27 LAB — COMPREHENSIVE METABOLIC PANEL (CC13)
ALK PHOS: 104 U/L (ref 40–150)
ALT: 34 U/L (ref 0–55)
ANION GAP: 9 meq/L (ref 3–11)
AST: 34 U/L (ref 5–34)
Albumin: 4.5 g/dL (ref 3.5–5.0)
BUN: 18.9 mg/dL (ref 7.0–26.0)
CO2: 25 mEq/L (ref 22–29)
CREATININE: 1 mg/dL (ref 0.7–1.3)
Calcium: 9.9 mg/dL (ref 8.4–10.4)
Chloride: 104 mEq/L (ref 98–109)
Glucose: 110 mg/dl (ref 70–140)
Potassium: 4.5 mEq/L (ref 3.5–5.1)
Sodium: 138 mEq/L (ref 136–145)
Total Bilirubin: 1.74 mg/dL — ABNORMAL HIGH (ref 0.20–1.20)
Total Protein: 7.9 g/dL (ref 6.4–8.3)

## 2014-04-28 LAB — PSA: PSA: 1.35 ng/mL (ref <=4.00)

## 2014-04-28 LAB — TESTOSTERONE: Testosterone: 27 ng/dL — ABNORMAL LOW (ref 300–890)

## 2014-05-02 ENCOUNTER — Encounter: Payer: Self-pay | Admitting: Oncology

## 2014-05-02 ENCOUNTER — Ambulatory Visit (HOSPITAL_BASED_OUTPATIENT_CLINIC_OR_DEPARTMENT_OTHER): Payer: BC Managed Care – PPO | Admitting: Oncology

## 2014-05-02 ENCOUNTER — Other Ambulatory Visit: Payer: Self-pay | Admitting: Medical Oncology

## 2014-05-02 ENCOUNTER — Telehealth: Payer: Self-pay | Admitting: Oncology

## 2014-05-02 VITALS — BP 124/77 | HR 56 | Temp 97.3°F | Resp 18 | Ht 68.0 in | Wt 164.4 lb

## 2014-05-02 DIAGNOSIS — Z5111 Encounter for antineoplastic chemotherapy: Secondary | ICD-10-CM

## 2014-05-02 DIAGNOSIS — C61 Malignant neoplasm of prostate: Secondary | ICD-10-CM

## 2014-05-02 DIAGNOSIS — E291 Testicular hypofunction: Secondary | ICD-10-CM

## 2014-05-02 MED ORDER — LEUPROLIDE ACETATE (3 MONTH) 22.5 MG IM KIT
22.5000 mg | PACK | Freq: Once | INTRAMUSCULAR | Status: AC
  Administered 2014-05-02: 22.5 mg via INTRAMUSCULAR
  Filled 2014-05-02: qty 22.5

## 2014-05-02 MED ORDER — BICALUTAMIDE 50 MG PO TABS: 50.0000 mg | ORAL_TABLET | Freq: Every day | ORAL | Status: DC

## 2014-05-02 NOTE — Telephone Encounter (Signed)
gv and printed appt sched and avs for pt for Dec °

## 2014-05-02 NOTE — Progress Notes (Signed)
Hematology and Oncology Follow Up Visit  David Huffman [id number] [birth year] 53 y.o. 05/02/2014 9:44 AM  CC: David Messick, MD    Principle Diagnosis: A 53 year old gentleman with prostate cancer diagnosed in March 2008, Gleason score 7, PSA of 31.  Prior Therapy: 1. Status post prostatectomy done in 2008, pathology revealing T2c N0 with 0 out of 12 lymph nodes involved, pathology revealed 4 + 3 equals 7 Gleason score.  2. The patient continued to have rise in his PSA up to 52 without any measurable disease.  Current therapy: He is on hormonal deprivation, Lupron 22.5 mg every 3 months on a continuous basis.  PSA nadir down to 0.4 and then rose up to 2.42 in 05/2011. Casodex 50 mg daily added in 05/2011.  Interim History:  David Huffman presents today for a followup visit by himself. He continues to tolerate Casodex well at this time. He has continued to be asymptomatic from prostate cancer.  He continues to be very active and was able to finish in the 60 mile bike race. He had not reported any chest pain.  Had not reported any back pain.  Had not reported any genitourinary complaints. He works full-time and continues to bike on a regular basis.  He had not really had any other decline in his energy or decline in his health. He has not reported any new illnesses or hospitalizations. No new complications related to androgen deprivation. He has been swimming regularly and have complained of your pain. He does not report any headaches or blurry vision or drainage from his ear. Is not reporting any sinus congestion or fevers. Does not report any change in his bowel habits or nausea or vomiting. Did not report any neurological symptoms. Does not report any frequency urgency or hesitancy. Rest of his review of systems unremarkable.  Medications: I have reviewed the patient's current medications. Current Outpatient Prescriptions  Medication Sig Dispense Refill  . Ascorbic Acid (VITAMIN C) 1000 MG tablet Take  1,000 mg by mouth daily.      Marland Kitchen b complex vitamins tablet Take 1 tablet by mouth daily.      . bicalutamide (CASODEX) 50 MG tablet Take 1 tablet (50 mg total) by mouth daily.  90 tablet  1  . Calcium Carbonate-Vitamin D (CALCIUM + D PO) Take 1 tablet by mouth daily.      . Lactase (LACTAID PO) Take by mouth daily as needed.      Marland Kitchen leuprolide (LUPRON) 22.5 MG injection Inject 22.5 mg into the muscle every 3 (three) months.      . Multiple Vitamins-Minerals (MULTIVITAMIN PO) Take 1 tablet by mouth daily.      Marland Kitchen VITAMIN D, ERGOCALCIFEROL, PO Take 1 tablet by mouth daily.       No current facility-administered medications for this visit.    Allergies: No Known Allergies  Past Medical History, Surgical history, Social history, and Family History were reviewed and updated.   Physical Exam: Blood pressure 124/77, pulse 56, temperature 97.3 F (36.3 C), temperature source Oral, resp. rate 18, height 5\' 8"  (1.727 m), weight 164 lb 6.4 oz (74.571 kg). ECOG: 0 General appearance: alert awake not in any distress. Head: Normocephalic, without obvious abnormality, atraumatic Neck: no adenopathy Lymph nodes: Cervical, supraclavicular, and axillary nodes normal. Heart:regular rate and rhythm, S1, S2 normal, no murmur, click, rub or gallop Lung:chest clear, no wheezing, rales, normal symmetric air entry Abdomin: soft, non-tender, without masses or organomegaly EXT:no erythema, induration, or nodules Examination of his  left ear: Showed no evidence of drainage or otitis externa.  Lab Results: Lab Results  Component Value Date   WBC 6.0 04/27/2014   HGB 15.6 04/27/2014   HCT 46.0 04/27/2014   MCV 87.7 04/27/2014   PLT 228 04/27/2014     Chemistry      Component Value Date/Time   NA 138 04/27/2014 1107   NA 139 03/15/2012 0911   NA 144 09/15/2011 0953   K 4.5 04/27/2014 1107   K 4.7 03/15/2012 0911   K 4.6 09/15/2011 0953   CL 106 01/03/2013 0916   CL 106 03/15/2012 0911   CL 103 09/15/2011 0953   CO2 25  04/27/2014 1107   CO2 26 03/15/2012 0911   CO2 28 09/15/2011 0953   BUN 18.9 04/27/2014 1107   BUN 15 03/15/2012 0911   BUN 15 09/15/2011 0953   CREATININE 1.0 04/27/2014 1107   CREATININE 0.92 03/15/2012 0911   CREATININE 0.9 09/15/2011 0953      Component Value Date/Time   CALCIUM 9.9 04/27/2014 1107   CALCIUM 9.5 03/15/2012 0911   CALCIUM 9.3 09/15/2011 0953   ALKPHOS 104 04/27/2014 1107   ALKPHOS 84 03/15/2012 0911   ALKPHOS 93* 09/15/2011 0953   AST 34 04/27/2014 1107   AST 107* 03/15/2012 0911   AST 60* 09/15/2011 0953   ALT 34 04/27/2014 1107   ALT 46 03/15/2012 0911   ALT 82* 09/15/2011 0953   BILITOT 1.74* 04/27/2014 1107   BILITOT 1.4* 03/15/2012 0911   BILITOT 1.80* 09/15/2011 0953       Results for David Huffman (MRN [id number]) as of 05/02/2014 08:28  Ref. Range 07/25/2013 08:35 01/29/2014 08:21 04/27/2014 11:07  PSA Latest Range: <=4.00 ng/mL 0.70 1.29 1.35     Impression and Plan:   ASSESSMENT AND PLAN:  This is a pleasant 53 year old gentleman with the following issues:  1. 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 Casodex was added to his regimen in 05/2011. He tolerated well without any complications. PSA have slightly increased to 1.35 1.29.Marland Kitchen His imaging studies were done in March of 2015 and showed no cancer relapse. I will continue to monitor him closely and repeat his PSA in 3 months. If his PSA continues to rise, we will consider second line hormonal manipulation at that time. Require staging workup March of 2016 2. Androgen deprivation.  Will continue Lupron today and every 3 months. Next dose will be today and repeat in 07/2014. 3. Bone health.  Continue calcium and vitamin D supplementation.   4. Screening colonoscopy: This was arranged for them prior visits. 5. Followup will be in 3 months' time.   [facility], MD 9/9/20159:44 AM

## 2014-07-31 ENCOUNTER — Other Ambulatory Visit (HOSPITAL_BASED_OUTPATIENT_CLINIC_OR_DEPARTMENT_OTHER): Payer: BC Managed Care – PPO

## 2014-07-31 DIAGNOSIS — C61 Malignant neoplasm of prostate: Secondary | ICD-10-CM

## 2014-07-31 LAB — COMPREHENSIVE METABOLIC PANEL (CC13)
ALK PHOS: 107 U/L (ref 40–150)
ALT: 44 U/L (ref 0–55)
AST: 41 U/L — ABNORMAL HIGH (ref 5–34)
Albumin: 4.1 g/dL (ref 3.5–5.0)
Anion Gap: 8 mEq/L (ref 3–11)
BUN: 16.7 mg/dL (ref 7.0–26.0)
CALCIUM: 10.1 mg/dL (ref 8.4–10.4)
CHLORIDE: 106 meq/L (ref 98–109)
CO2: 28 mEq/L (ref 22–29)
Creatinine: 1.1 mg/dL (ref 0.7–1.3)
EGFR: 76 mL/min/{1.73_m2} — ABNORMAL LOW (ref >90)
Glucose: 71 mg/dl (ref 70–140)
POTASSIUM: 4.3 meq/L (ref 3.5–5.1)
Sodium: 143 mEq/L (ref 136–145)
Total Bilirubin: 1.84 mg/dL — ABNORMAL HIGH (ref 0.20–1.20)
Total Protein: 7.2 g/dL (ref 6.4–8.3)

## 2014-07-31 LAB — CBC WITH DIFFERENTIAL/PLATELET
BASO%: 0.2 % (ref 0.0–2.0)
Basophils Absolute: 0 10*3/uL (ref 0.0–0.1)
EOS%: 3.1 % (ref 0.0–7.0)
Eosinophils Absolute: 0.2 10*3/uL (ref 0.0–0.5)
HCT: 42.9 % (ref 38.4–49.9)
HGB: 14.9 g/dL (ref 13.0–17.1)
LYMPH#: 2.2 10*3/uL (ref 0.9–3.3)
LYMPH%: 41.9 % (ref 14.0–49.0)
MCH: 30.1 pg (ref 27.2–33.4)
MCHC: 34.7 g/dL (ref 32.0–36.0)
MCV: 86.7 fL (ref 79.3–98.0)
MONO#: 0.4 10*3/uL (ref 0.1–0.9)
MONO%: 8.2 % (ref 0.0–14.0)
NEUT#: 2.4 10*3/uL (ref 1.5–6.5)
NEUT%: 46.6 % (ref 39.0–75.0)
Platelets: 212 10*3/uL (ref 140–400)
RBC: 4.95 10*6/uL (ref 4.20–5.82)
RDW: 12.5 % (ref 11.0–14.6)
WBC: 5.2 10*3/uL (ref 4.0–10.3)

## 2014-08-01 LAB — PSA: PSA: 1.68 ng/mL (ref <=4.00)

## 2014-08-02 ENCOUNTER — Ambulatory Visit (HOSPITAL_BASED_OUTPATIENT_CLINIC_OR_DEPARTMENT_OTHER): Payer: BC Managed Care – PPO | Admitting: Oncology

## 2014-08-02 ENCOUNTER — Telehealth: Payer: Self-pay | Admitting: Oncology

## 2014-08-02 DIAGNOSIS — C61 Malignant neoplasm of prostate: Secondary | ICD-10-CM

## 2014-08-02 DIAGNOSIS — Z5111 Encounter for antineoplastic chemotherapy: Secondary | ICD-10-CM

## 2014-08-02 MED ORDER — LEUPROLIDE ACETATE (3 MONTH) 22.5 MG IM KIT
22.5000 mg | PACK | Freq: Once | INTRAMUSCULAR | Status: AC
  Administered 2014-08-02: 22.5 mg via INTRAMUSCULAR
  Filled 2014-08-02: qty 22.5

## 2014-08-02 NOTE — Progress Notes (Signed)
Hematology and Oncology Follow Up Visit  Case Vassell [id number] 1961/04/08 53 y.o. 08/02/2014 9:48 AM  CC: [name], MD    Principle Diagnosis: A 53 year old gentleman with prostate cancer diagnosed in March 2008, Gleason score 7, PSA of 31.  Prior Therapy: 1. Status post prostatectomy done in 2008, pathology revealing T2c N0 with 0 out of 12 lymph nodes involved, pathology revealed 4 + 3 equals 7 Gleason score.  2. The patient continued to have rise in his PSA up to 52 without any measurable disease.  Current therapy: He is on hormonal deprivation, Lupron 22.5 mg every 3 months on a continuous basis.  PSA nadir down to 0.4 and then rose up to 2.42 in 05/2011. Casodex 50 mg daily added in 05/2011.  Interim History:  Mr. Haugan presents today for a followup visit by himself. He continues to tolerate Casodex without complications at this time. He continues to be asymptomatic from prostate cancer.  He continues to be very active and was able to exercise regularly and works full time at [employer]. He had not reported any chest pain.  Had not reported any back pain.  Had not reported any genitourinary complaints.  He had not really had any other decline in his energy or decline in his health. He has not reported any new illnesses or hospitalizations. No new complications related to androgen deprivation. He does not report any headaches or blurry vision or drainage from his ear. Is not reporting any sinus congestion or fevers. Does not report any change in his bowel habits or nausea or vomiting. Did not report any neurological symptoms. Does not report any frequency urgency or hesitancy. Rest of his review of systems unremarkable.  Medications: I have reviewed the patient's current medications. Current Outpatient Prescriptions  Medication Sig Dispense Refill  . Ascorbic Acid (VITAMIN C) 1000 MG tablet Take 1,000 mg by mouth daily.    Marland Kitchen b complex vitamins tablet Take 1 tablet by mouth daily.    .  bicalutamide (CASODEX) 50 MG tablet Take 1 tablet (50 mg total) by mouth daily. 90 tablet 1  . Calcium Carbonate-Vitamin D (CALCIUM + D PO) Take 1 tablet by mouth daily.    . Lactase (LACTAID PO) Take by mouth daily as needed.    Marland Kitchen leuprolide (LUPRON) 22.5 MG injection Inject 22.5 mg into the muscle every 3 (three) months.    . Multiple Vitamins-Minerals (MULTIVITAMIN PO) Take 1 tablet by mouth daily.    Marland Kitchen VITAMIN D, ERGOCALCIFEROL, PO Take 1 tablet by mouth daily.     No current facility-administered medications for this visit.    Allergies: No Known Allergies  Past Medical History, Surgical history, Social history, and Family History were reviewed and updated.   Physical Exam: There were no vitals taken for this visit. ECOG: 0 General appearance: alert awake not in any distress. Head: Normocephalic, without obvious abnormality, atraumatic Neck: no adenopathy Lymph nodes: Cervical, supraclavicular, and axillary nodes normal. Heart:regular rate and rhythm, S1, S2 normal, no murmur, click, rub or gallop Lung:chest clear, no wheezing, rales, normal symmetric air entry Abdomin: soft, non-tender, without masses or organomegaly EXT:no erythema, induration, or nodules   Lab Results: Lab Results  Component Value Date   WBC 5.2 07/31/2014   HGB 14.9 07/31/2014   HCT 42.9 07/31/2014   MCV 86.7 07/31/2014   PLT 212 07/31/2014     Chemistry      Component Value Date/Time   NA 143 07/31/2014 0815   NA 139 03/15/2012 0911  NA 144 09/15/2011 0953   K 4.3 07/31/2014 0815   K 4.7 03/15/2012 0911   K 4.6 09/15/2011 0953   CL 106 01/03/2013 0916   CL 106 03/15/2012 0911   CL 103 09/15/2011 0953   CO2 28 07/31/2014 0815   CO2 26 03/15/2012 0911   CO2 28 09/15/2011 0953   BUN 16.7 07/31/2014 0815   BUN 15 03/15/2012 0911   BUN 15 09/15/2011 0953   CREATININE 1.1 07/31/2014 0815   CREATININE 0.92 03/15/2012 0911   CREATININE 0.9 09/15/2011 0953      Component Value Date/Time    CALCIUM 10.1 07/31/2014 0815   CALCIUM 9.5 03/15/2012 0911   CALCIUM 9.3 09/15/2011 0953   ALKPHOS 107 07/31/2014 0815   ALKPHOS 84 03/15/2012 0911   ALKPHOS 93* 09/15/2011 0953   AST 41* 07/31/2014 0815   AST 107* 03/15/2012 0911   AST 60* 09/15/2011 0953   ALT 44 07/31/2014 0815   ALT 46 03/15/2012 0911   ALT 82* 09/15/2011 0953   BILITOT 1.84* 07/31/2014 0815   BILITOT 1.4* 03/15/2012 0911   BILITOT 1.80* 09/15/2011 0953       Results for JAXSIN, BOTTOMLEY (MRN [id number]) as of 08/02/2014 09:45  Ref. Range 04/17/2013 12:47 07/25/2013 08:35 01/29/2014 08:21 04/27/2014 11:07 07/31/2014 08:16  PSA Latest Range: <=4.00 ng/mL 0.75 0.70 1.29 1.35 1.68      Impression and Plan:   ASSESSMENT AND PLAN:  This is a pleasant 53 year old gentleman with the following issues:  1. 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 Casodex was added to his regimen in 05/2011. He tolerated well without any complications. PSA have slightly increased to 1.68. His imaging studies were done in March of 2015 and showed no cancer relapse. His doubling time still close to 12 months at this time. The plan is to continue Casodex for the time being and I will repeat imaging studies in March 2016. He develops measurable metastatic disease or he develops rapid doubling time of his PSA we will switch him to a different agent.  2. Androgen deprivation.  Will continue Lupron today and every 3 months. Next dose will on 10/2014. 3. Bone health.  Continue calcium and vitamin D supplementation.   4. Screening colonoscopy: this was completed in 2015.  5. Followup will be in 3 months' time.   [facility], MD 12/10/20159:48 AM

## 2014-08-02 NOTE — Telephone Encounter (Signed)
Gave avs & cal for March. °

## 2014-08-22 ENCOUNTER — Other Ambulatory Visit: Payer: Self-pay | Admitting: *Deleted

## 2014-08-22 DIAGNOSIS — C61 Malignant neoplasm of prostate: Secondary | ICD-10-CM

## 2014-08-22 MED ORDER — BICALUTAMIDE 50 MG PO TABS: 50.0000 mg | ORAL_TABLET | Freq: Every day | ORAL | Status: DC

## 2014-10-30 ENCOUNTER — Ambulatory Visit (HOSPITAL_COMMUNITY)
Admission: RE | Admit: 2014-10-30 | Discharge: 2014-10-30 | Disposition: A | Discharge status: Home / Self Care | Payer: BLUE CROSS/BLUE SHIELD | Source: Ambulatory Visit | Attending: Oncology | Admitting: Oncology

## 2014-10-30 ENCOUNTER — Ambulatory Visit: Payer: BC Managed Care – PPO

## 2014-10-30 ENCOUNTER — Other Ambulatory Visit (HOSPITAL_BASED_OUTPATIENT_CLINIC_OR_DEPARTMENT_OTHER): Payer: BLUE CROSS/BLUE SHIELD

## 2014-10-30 ENCOUNTER — Encounter (HOSPITAL_COMMUNITY)
Admission: RE | Admit: 2014-10-30 | Discharge: 2014-10-30 | Disposition: A | Discharge status: Home / Self Care | Payer: BLUE CROSS/BLUE SHIELD | Source: Ambulatory Visit | Attending: Oncology | Admitting: Oncology

## 2014-10-30 ENCOUNTER — Ambulatory Visit: Payer: BC Managed Care – PPO | Admitting: Oncology

## 2014-10-30 DIAGNOSIS — C61 Malignant neoplasm of prostate: Secondary | ICD-10-CM

## 2014-10-30 DIAGNOSIS — M899 Disorder of bone, unspecified: Secondary | ICD-10-CM | POA: Insufficient documentation

## 2014-10-30 DIAGNOSIS — Z9079 Acquired absence of other genital organ(s): Secondary | ICD-10-CM | POA: Insufficient documentation

## 2014-10-30 DIAGNOSIS — K7689 Other specified diseases of liver: Secondary | ICD-10-CM | POA: Insufficient documentation

## 2014-10-30 LAB — COMPREHENSIVE METABOLIC PANEL (CC13)
ALBUMIN: 4.3 g/dL (ref 3.5–5.0)
ALT: 29 U/L (ref 0–55)
ANION GAP: 8 meq/L (ref 3–11)
AST: 31 U/L (ref 5–34)
Alkaline Phosphatase: 104 U/L (ref 40–150)
BILIRUBIN TOTAL: 1.79 mg/dL — AB (ref 0.20–1.20)
BUN: 16.8 mg/dL (ref 7.0–26.0)
CALCIUM: 9.9 mg/dL (ref 8.4–10.4)
CHLORIDE: 106 meq/L (ref 98–109)
CO2: 27 meq/L (ref 22–29)
Creatinine: 0.9 mg/dL (ref 0.7–1.3)
EGFR: >90 mL/min/{1.73_m2} (ref >90)
GLUCOSE: 94 mg/dL (ref 70–140)
Potassium: 4.6 mEq/L (ref 3.5–5.1)
SODIUM: 141 meq/L (ref 136–145)
TOTAL PROTEIN: 7.4 g/dL (ref 6.4–8.3)

## 2014-10-30 LAB — CBC WITH DIFFERENTIAL/PLATELET
BASO%: 0.9 % (ref 0.0–2.0)
Basophils Absolute: 0 10*3/uL (ref 0.0–0.1)
EOS ABS: 0.2 10*3/uL (ref 0.0–0.5)
EOS%: 3.5 % (ref 0.0–7.0)
HEMATOCRIT: 44.7 % (ref 38.4–49.9)
HEMOGLOBIN: 14.8 g/dL (ref 13.0–17.1)
LYMPH%: 36.4 % (ref 14.0–49.0)
MCH: 29.2 pg (ref 27.2–33.4)
MCHC: 33 g/dL (ref 32.0–36.0)
MCV: 88.4 fL (ref 79.3–98.0)
MONO#: 0.4 10*3/uL (ref 0.1–0.9)
MONO%: 7.8 % (ref 0.0–14.0)
NEUT%: 51.4 % (ref 39.0–75.0)
NEUTROS ABS: 2.5 10*3/uL (ref 1.5–6.5)
PLATELETS: 227 10*3/uL (ref 140–400)
RBC: 5.06 10*6/uL (ref 4.20–5.82)
RDW: 13 % (ref 11.0–14.6)
WBC: 4.9 10*3/uL (ref 4.0–10.3)
lymph#: 1.8 10*3/uL (ref 0.9–3.3)

## 2014-10-30 MED ORDER — TECHNETIUM TC 99M MEDRONATE IV KIT
26.9000 | PACK | Freq: Once | INTRAVENOUS | Status: AC | PRN
  Administered 2014-10-30: 26.9 via INTRAVENOUS

## 2014-10-30 MED ORDER — IOHEXOL 300 MG/ML  SOLN
100.0000 mL | Freq: Once | INTRAMUSCULAR | Status: AC | PRN
  Administered 2014-10-30: 100 mL via INTRAVENOUS

## 2014-10-31 ENCOUNTER — Ambulatory Visit (HOSPITAL_BASED_OUTPATIENT_CLINIC_OR_DEPARTMENT_OTHER): Payer: BLUE CROSS/BLUE SHIELD

## 2014-10-31 ENCOUNTER — Telehealth: Payer: Self-pay | Admitting: Oncology

## 2014-10-31 ENCOUNTER — Ambulatory Visit (HOSPITAL_BASED_OUTPATIENT_CLINIC_OR_DEPARTMENT_OTHER): Payer: BLUE CROSS/BLUE SHIELD | Admitting: Oncology

## 2014-10-31 VITALS — BP 126/71 | HR 68 | Temp 97.8°F | Resp 18 | Ht 68.0 in | Wt 167.7 lb

## 2014-10-31 DIAGNOSIS — Z5111 Encounter for antineoplastic chemotherapy: Secondary | ICD-10-CM

## 2014-10-31 DIAGNOSIS — C61 Malignant neoplasm of prostate: Secondary | ICD-10-CM

## 2014-10-31 DIAGNOSIS — E291 Testicular hypofunction: Secondary | ICD-10-CM

## 2014-10-31 LAB — PSA: PSA: 2.29 ng/mL (ref <=4.00)

## 2014-10-31 MED ORDER — LEUPROLIDE ACETATE (3 MONTH) 22.5 MG IM KIT
22.5000 mg | PACK | Freq: Once | INTRAMUSCULAR | Status: AC
  Administered 2014-10-31: 22.5 mg via INTRAMUSCULAR
  Filled 2014-10-31: qty 22.5

## 2014-10-31 NOTE — Telephone Encounter (Signed)
Pt confirmed labs/ov/inj per 03/09 POF, gave pt AVS.... KJ,

## 2014-10-31 NOTE — Progress Notes (Signed)
Hematology and Oncology Follow Up Visit  Ashtin Rosner [id number] 1961-02-09 54 y.o. 10/31/2014 9:20 AM  CC: [name], MD    Principle Diagnosis: A 54 year old gentleman with prostate cancer diagnosed in March 2008, Gleason score 7, PSA of 31.  Prior Therapy: 1. Status post prostatectomy done in 2008, pathology revealing T2c N0 with 0 out of 12 lymph nodes involved, pathology revealed 4 + 3 equals 7 Gleason score.  2. The patient continued to have rise in his PSA up to 52 without any measurable disease.  Current therapy: He is on hormonal deprivation, Lupron 22.5 mg every 3 months on a continuous basis.  PSA nadir down to 0.4 and then rose up to 2.42 in 05/2011. Casodex 50 mg daily added in 05/2011.  Interim History:  Mr. Rawlinson presents today for a followup visit with his wife. Since the last visit, he has no complaints. Continues to be very active and exercises regularly. He continues to tolerate Casodex without complications at this time. He continues to be asymptomatic from prostate cancer.  He had not reported any chest pain.  Had not reported any back pain.  Had not reported any genitourinary complaints.  He had not really had any other decline in his energy or decline in his health. He has not reported any new illnesses or hospitalizations. No new complications related to androgen deprivation. He does not report any headaches or blurry vision or drainage from his ear. Is not reporting any sinus congestion or fevers. Does not report any change in his bowel habits or nausea or vomiting. Did not report any neurological symptoms. Does not report any frequency urgency or hesitancy. Rest of his review of systems unremarkable.  Medications: I have reviewed the patient's current medications. Current Outpatient Prescriptions  Medication Sig Dispense Refill  . Ascorbic Acid (VITAMIN C) 1000 MG tablet Take 1,000 mg by mouth daily.    Marland Kitchen b complex vitamins tablet Take 1 tablet by mouth daily.    .  bicalutamide (CASODEX) 50 MG tablet Take 1 tablet (50 mg total) by mouth daily. 90 tablet 1  . Calcium Carbonate-Vitamin D (CALCIUM + D PO) Take 1 tablet by mouth daily.    . Lactase (LACTAID PO) Take by mouth daily as needed.    Marland Kitchen leuprolide (LUPRON) 22.5 MG injection Inject 22.5 mg into the muscle every 3 (three) months.    . Multiple Vitamins-Minerals (MULTIVITAMIN PO) Take 1 tablet by mouth daily.    Marland Kitchen VITAMIN D, ERGOCALCIFEROL, PO Take 1 tablet by mouth daily.     No current facility-administered medications for this visit.    Allergies: No Known Allergies  Past Medical History, Surgical history, Social history, and Family History were reviewed and updated.   Physical Exam: Blood pressure 126/71, pulse 68, temperature 97.8 F (36.6 C), temperature source Oral, resp. rate 18, height 5\' 8"  (1.727 m), weight 167 lb 11.2 oz (76.068 kg). ECOG: 0 General appearance: alert awake not in any distress. Head: Normocephalic, without obvious abnormality, atraumatic Neck: no adenopathy Lymph nodes: Cervical, supraclavicular, and axillary nodes normal. Heart:regular rate and rhythm, S1, S2 normal, no murmur, click, rub or gallop Lung:chest clear, no wheezing, rales, normal symmetric air entry Abdomin: soft, non-tender, without masses or organomegaly EXT:no erythema, induration, or nodules   Lab Results: Lab Results  Component Value Date   WBC 4.9 10/30/2014   HGB 14.8 10/30/2014   HCT 44.7 10/30/2014   MCV 88.4 10/30/2014   PLT 227 10/30/2014     Chemistry  Component Value Date/Time   NA 141 10/30/2014 0829   NA 139 03/15/2012 0911   NA 144 09/15/2011 0953   K 4.6 10/30/2014 0829   K 4.7 03/15/2012 0911   K 4.6 09/15/2011 0953   CL 106 01/03/2013 0916   CL 106 03/15/2012 0911   CL 103 09/15/2011 0953   CO2 27 10/30/2014 0829   CO2 26 03/15/2012 0911   CO2 28 09/15/2011 0953   BUN 16.8 10/30/2014 0829   BUN 15 03/15/2012 0911   BUN 15 09/15/2011 0953   CREATININE 0.9  10/30/2014 0829   CREATININE 0.92 03/15/2012 0911   CREATININE 0.9 09/15/2011 0953      Component Value Date/Time   CALCIUM 9.9 10/30/2014 0829   CALCIUM 9.5 03/15/2012 0911   CALCIUM 9.3 09/15/2011 0953   ALKPHOS 104 10/30/2014 0829   ALKPHOS 84 03/15/2012 0911   ALKPHOS 93* 09/15/2011 0953   AST 31 10/30/2014 0829   AST 107* 03/15/2012 0911   AST 60* 09/15/2011 0953   ALT 29 10/30/2014 0829   ALT 46 03/15/2012 0911   ALT 82* 09/15/2011 0953   BILITOT 1.79* 10/30/2014 0829   BILITOT 1.4* 03/15/2012 0911   BILITOT 1.80* 09/15/2011 0953         Results for HARRY, SHUCK (MRN [id number]) as of 10/31/2014 09:07  Ref. Range 04/27/2014 11:07 07/31/2014 08:16 10/30/2014 08:29  PSA Latest Range: <=4.00 ng/mL 1.35 1.68 2.29   EXAM: CT ABDOMEN AND PELVIS WITH CONTRAST  TECHNIQUE: Multidetector CT imaging of the abdomen and pelvis was performed using the standard protocol following bolus administration of intravenous contrast.  CONTRAST: 15mL OMNIPAQUE IOHEXOL 300 MG/ML SOLN  COMPARISON: 10/26/2013  FINDINGS: Lower chest: Clear lung bases. Normal heart size without pericardial or pleural effusion.  Hepatobiliary: Tiny medial segment left liver lobe cyst. Normal gallbladder, without biliary ductal dilatation.  Pancreas: Normal, without mass or ductal dilatation.  Spleen: Normal  Adrenals/Urinary Tract: Normal adrenal glands. Normal kidneys, without hydronephrosis. Normal urinary bladder.  Stomach/Bowel: Normal stomach, without wall thickening. Normal colon, appendix, and terminal ileum. Normal small bowel.  Vascular/Lymphatic: Normal caliber of the aorta and branch vessels. Circumaortic left renal vein. Right external iliac node is similar, upper normal at 1.0 cm on image 78.  Reproductive: Prostatectomy. No locally recurrent disease.  Other: No significant free fluid.  Musculoskeletal: Similar tiny sclerotic lesion within the anterior lateral left  seventh rib. No suspicious osseous finding.  IMPRESSION: 1. Status post prostatectomy, without evidence of metastatic disease. 2. Similar appearance of small pelvic lymph nodes.   EXAM: NUCLEAR MEDICINE WHOLE BODY BONE SCAN  TECHNIQUE: Whole body anterior and posterior images were obtained approximately 3 hours after intravenous injection of radiopharmaceutical.  RADIOPHARMACEUTICALS: 26.9 mCi Technetium-99 MDP  COMPARISON: 10/26/2013  FINDINGS: No abnormal accumulation of radiotracer within the axillary or appendicular skeleton to suggest skeletal metastases.  IMPRESSION: No scintigraphic evidence of skeletal metastases.      Impression and Plan:   ASSESSMENT AND PLAN:  This is a pleasant 54 year old gentleman with the following issues:  1. 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 Casodex was added to his regimen in 05/2011. He tolerated well without any complications. His staging workup including a CT scan and a bone scan was reviewed today continues to show no evidence of disease. His PSA continues to rise very slowly with a doubling time over 6 months. In fact that is completely asymptomatic, no measurable disease and a slow rise in his PSA we'll continue  Casodex for the time being. Second line hormonal manipulation options were discussed and will be utilized if his PSA starts to rise rapidly in the future. We will continue to monitor him closely regarding this issue.  2. Androgen deprivation.  Will continue Lupron today and every 3 months. Next dose will on 01/2015. 3. Bone health.  Continue calcium and vitamin D supplementation.   4. Screening colonoscopy: this was completed in 2015.  5. Followup will be in 3 months' time.   [facility], MD 3/9/20169:20 AM

## 2015-01-29 ENCOUNTER — Other Ambulatory Visit (HOSPITAL_BASED_OUTPATIENT_CLINIC_OR_DEPARTMENT_OTHER): Payer: BLUE CROSS/BLUE SHIELD

## 2015-01-29 DIAGNOSIS — C61 Malignant neoplasm of prostate: Secondary | ICD-10-CM | POA: Diagnosis not present

## 2015-01-29 LAB — COMPREHENSIVE METABOLIC PANEL (CC13)
ALK PHOS: 112 U/L (ref 40–150)
ALT: 33 U/L (ref 0–55)
AST: 35 U/L — ABNORMAL HIGH (ref 5–34)
Albumin: 4.2 g/dL (ref 3.5–5.0)
Anion Gap: 8 mEq/L (ref 3–11)
BUN: 14.2 mg/dL (ref 7.0–26.0)
CALCIUM: 10.1 mg/dL (ref 8.4–10.4)
CHLORIDE: 106 meq/L (ref 98–109)
CO2: 28 meq/L (ref 22–29)
Creatinine: 0.9 mg/dL (ref 0.7–1.3)
EGFR: >90 mL/min/{1.73_m2} (ref >90)
Glucose: 86 mg/dl (ref 70–140)
Potassium: 4.5 mEq/L (ref 3.5–5.1)
Sodium: 142 mEq/L (ref 136–145)
Total Bilirubin: 1.67 mg/dL — ABNORMAL HIGH (ref 0.20–1.20)
Total Protein: 7.5 g/dL (ref 6.4–8.3)

## 2015-01-29 LAB — CBC WITH DIFFERENTIAL/PLATELET
BASO%: 0.4 % (ref 0.0–2.0)
Basophils Absolute: 0 10*3/uL (ref 0.0–0.1)
EOS%: 4.4 % (ref 0.0–7.0)
Eosinophils Absolute: 0.2 10*3/uL (ref 0.0–0.5)
HCT: 43.7 % (ref 38.4–49.9)
HEMOGLOBIN: 15.3 g/dL (ref 13.0–17.1)
LYMPH%: 40.1 % (ref 14.0–49.0)
MCH: 30.3 pg (ref 27.2–33.4)
MCHC: 35 g/dL (ref 32.0–36.0)
MCV: 86.5 fL (ref 79.3–98.0)
MONO#: 0.5 10*3/uL (ref 0.1–0.9)
MONO%: 9.5 % (ref 0.0–14.0)
NEUT%: 45.6 % (ref 39.0–75.0)
NEUTROS ABS: 2.2 10*3/uL (ref 1.5–6.5)
PLATELETS: 193 10*3/uL (ref 140–400)
RBC: 5.05 10*6/uL (ref 4.20–5.82)
RDW: 12.6 % (ref 11.0–14.6)
WBC: 4.7 10*3/uL (ref 4.0–10.3)
lymph#: 1.9 10*3/uL (ref 0.9–3.3)

## 2015-01-30 LAB — PSA: PSA: 1.69 ng/mL (ref <=4.00)

## 2015-02-01 ENCOUNTER — Ambulatory Visit: Payer: BLUE CROSS/BLUE SHIELD

## 2015-02-01 ENCOUNTER — Telehealth: Payer: Self-pay | Admitting: Oncology

## 2015-02-01 ENCOUNTER — Ambulatory Visit (HOSPITAL_BASED_OUTPATIENT_CLINIC_OR_DEPARTMENT_OTHER): Payer: BLUE CROSS/BLUE SHIELD | Admitting: Oncology

## 2015-02-01 VITALS — BP 118/72 | HR 64 | Temp 98.0°F | Resp 19 | Ht 68.0 in | Wt 167.1 lb

## 2015-02-01 DIAGNOSIS — C61 Malignant neoplasm of prostate: Secondary | ICD-10-CM | POA: Diagnosis not present

## 2015-02-01 DIAGNOSIS — E291 Testicular hypofunction: Secondary | ICD-10-CM | POA: Diagnosis not present

## 2015-02-01 MED ORDER — LEUPROLIDE ACETATE (3 MONTH) 22.5 MG IM KIT
22.5000 mg | PACK | Freq: Once | INTRAMUSCULAR | Status: DC
  Filled 2015-02-01: qty 22.5

## 2015-02-01 MED ORDER — LEUPROLIDE ACETATE (3 MONTH) 22.5 MG IM KIT
22.5000 mg | PACK | Freq: Once | INTRAMUSCULAR | Status: AC
  Administered 2015-02-01: 22.5 mg via INTRAMUSCULAR
  Filled 2015-02-01: qty 22.5

## 2015-02-01 NOTE — Progress Notes (Signed)
Lupron given by desk nurse

## 2015-02-01 NOTE — Progress Notes (Signed)
Hematology and Oncology Follow Up Visit  David Huffman [id number] [birth year] 54 y.o. 02/01/2015 1:51 PM  CC: David Messick, MD    Principle Diagnosis: A 54 year old gentleman with prostate cancer diagnosed in March 2008, Gleason score 7, PSA of 31.  Prior Therapy: 1. Status post prostatectomy done in 2008, pathology revealing T2c N0 with 0 out of 12 lymph nodes involved, pathology revealed 4 + 3 equals 7 Gleason score.  2. The patient continued to have rise in his PSA up to 54 without any measurable disease.  Current therapy:  He is on hormonal deprivation, Lupron 22.5 mg every 3 months on a continuous basis started in 2008.  PSA nadir down to 0.4 and then rose up to 2.42 in 05/2011. Casodex 50 mg daily added in 05/2011.  Interim History:  David Huffman presents today for a followup visit by himself. Since the last visit, he continues to do well. He continues to be very active and exercises regularly. He reports he is taking Casodex without complications at this time. He continues to work full time without any decline.He continues to be asymptomatic from prostate cancer.  He had not reported any chest pain.  Had not reported any back pain.  Had not reported any genitourinary complaints.  He had not really had any other decline in his energy or decline in his health. He has not reported any new illnesses or hospitalizations. No new complications related to androgen deprivation. He does not report any headaches or blurry vision or drainage from his ear. Is not reporting any sinus congestion or fevers. Does not report any change in his bowel habits or nausea or vomiting. Did not report any neurological symptoms. Does not report any frequency urgency or hesitancy. Rest of his review of systems unremarkable.  Medications: I have reviewed the patient's current medications. Current Outpatient Prescriptions  Medication Sig Dispense Refill  . Ascorbic Acid (VITAMIN C) 1000 MG tablet Take 1,000 mg by mouth  daily.    Marland Kitchen b complex vitamins tablet Take 1 tablet by mouth daily.    . bicalutamide (CASODEX) 50 MG tablet Take 1 tablet (50 mg total) by mouth daily. 90 tablet 1  . Calcium Carbonate-Vitamin D (CALCIUM + D PO) Take 1 tablet by mouth daily.    . Lactase (LACTAID PO) Take by mouth daily as needed.    Marland Kitchen leuprolide (LUPRON) 22.5 MG injection Inject 22.5 mg into the muscle every 3 (three) months.    . Multiple Vitamins-Minerals (MULTIVITAMIN PO) Take 1 tablet by mouth daily.    Marland Kitchen VITAMIN D, ERGOCALCIFEROL, PO Take 1 tablet by mouth daily.     Current Facility-Administered Medications  Medication Dose Route Frequency Provider Last Rate Last Dose  . leuprolide (LUPRON) injection 22.5 mg  22.5 mg Intramuscular Once David Portela, MD        Allergies: No Known Allergies  Past Medical History, Surgical history, Social history, and Family History were reviewed and updated.   Physical Exam: Blood pressure 118/72, pulse 64, temperature 98 F (36.7 C), temperature source Oral, resp. rate 19, height 5\' 8"  (1.727 m), weight 167 lb 1.6 oz (75.796 kg), SpO2 100 %. ECOG: 0 General appearance: alert awake not in any distress. Well-appearing gentleman. Head: Normocephalic, without obvious abnormality Neck: no adenopathy Lymph nodes: Cervical, supraclavicular, and axillary nodes normal. Heart:regular rate and rhythm, S1, S2 normal, no murmur, click, rub or gallop Lung:chest clear, no wheezing, rales, normal symmetric air entry Abdomin: soft, non-tender, without masses or organomegaly EXT:no erythema,  induration, or nodules   Lab Results: Lab Results  Component Value Date   WBC 4.7 01/29/2015   HGB 15.3 01/29/2015   HCT 43.7 01/29/2015   MCV 86.5 01/29/2015   PLT 193 01/29/2015     Chemistry      Component Value Date/Time   NA 142 01/29/2015 0849   NA 139 03/15/2012 0911   NA 144 09/15/2011 0953   K 4.5 01/29/2015 0849   K 4.7 03/15/2012 0911   K 4.6 09/15/2011 0953   CL 106  01/03/2013 0916   CL 106 03/15/2012 0911   CL 103 09/15/2011 0953   CO2 28 01/29/2015 0849   CO2 26 03/15/2012 0911   CO2 28 09/15/2011 0953   BUN 14.2 01/29/2015 0849   BUN 15 03/15/2012 0911   BUN 15 09/15/2011 0953   CREATININE 0.9 01/29/2015 0849   CREATININE 0.92 03/15/2012 0911   CREATININE 0.9 09/15/2011 0953      Component Value Date/Time   CALCIUM 10.1 01/29/2015 0849   CALCIUM 9.5 03/15/2012 0911   CALCIUM 9.3 09/15/2011 0953   ALKPHOS 112 01/29/2015 0849   ALKPHOS 84 03/15/2012 0911   ALKPHOS 93* 09/15/2011 0953   AST 35* 01/29/2015 0849   AST 107* 03/15/2012 0911   AST 60* 09/15/2011 0953   ALT 33 01/29/2015 0849   ALT 46 03/15/2012 0911   ALT 82* 09/15/2011 0953   BILITOT 1.67* 01/29/2015 0849   BILITOT 1.4* 03/15/2012 0911   BILITOT 1.80* 09/15/2011 0953       Results for [name], [name] (MRN [id number]) as of 02/01/2015 13:13  Ref. Range 07/31/2014 08:16 10/30/2014 08:29 01/29/2015 08:49  PSA Latest Ref Range: <=4.00 ng/mL 1.68 2.29 1.69       Impression and Plan:   ASSESSMENT AND PLAN:    This is a pleasant 54 year old gentleman with the following issues:  1. 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 Casodex was added to his regimen in 05/2011. He tolerated well without any complications. His staging workup including a CT scan and a bone scan from March 2016continues to show no evidence of disease. His last PSA from June 2016 continue to be low 1.69. The plan is to continue with the current management plan and monitor him every 3 months. I will repeat imaging studies in March 2017. This could be done sooner if needed to.  2. Androgen deprivation.  Will continue Lupron today and every 3 months. Next dose will on 04/2015. 3. Bone health.  Continue calcium and vitamin D supplementation.   4. Screening colonoscopy: this was completed in 2015.  5. Followup will be in 3 months' time.   David Button, MD 6/10/20161:51  PM

## 2015-02-01 NOTE — Telephone Encounter (Signed)
GAVE AND PRINTED APPT SCHED AND AVS FO RPT FOR sEPT

## 2015-03-14 ENCOUNTER — Telehealth: Payer: Self-pay | Admitting: *Deleted

## 2015-03-14 DIAGNOSIS — C61 Malignant neoplasm of prostate: Secondary | ICD-10-CM

## 2015-03-14 MED ORDER — BICALUTAMIDE 50 MG PO TABS: 50.0000 mg | ORAL_TABLET | Freq: Every day | ORAL | Status: DC

## 2015-03-14 NOTE — Telephone Encounter (Signed)
VM message received @ 11:11am requesting refill on his casodex. Done and VM left for patient stating his refill has been taken care of.

## 2015-05-08 ENCOUNTER — Other Ambulatory Visit (HOSPITAL_BASED_OUTPATIENT_CLINIC_OR_DEPARTMENT_OTHER): Payer: BLUE CROSS/BLUE SHIELD

## 2015-05-08 DIAGNOSIS — C61 Malignant neoplasm of prostate: Secondary | ICD-10-CM

## 2015-05-08 LAB — COMPREHENSIVE METABOLIC PANEL (CC13)
ALT: 31 U/L (ref 0–55)
ANION GAP: 6 meq/L (ref 3–11)
AST: 31 U/L (ref 5–34)
Albumin: 4 g/dL (ref 3.5–5.0)
Alkaline Phosphatase: 96 U/L (ref 40–150)
BUN: 14.4 mg/dL (ref 7.0–26.0)
CHLORIDE: 107 meq/L (ref 98–109)
CO2: 28 meq/L (ref 22–29)
CREATININE: 0.9 mg/dL (ref 0.7–1.3)
Calcium: 9.8 mg/dL (ref 8.4–10.4)
EGFR: >90 mL/min/{1.73_m2} (ref >90)
GLUCOSE: 95 mg/dL (ref 70–140)
Potassium: 4.9 mEq/L (ref 3.5–5.1)
SODIUM: 141 meq/L (ref 136–145)
Total Bilirubin: 1.66 mg/dL — ABNORMAL HIGH (ref 0.20–1.20)
Total Protein: 7 g/dL (ref 6.4–8.3)

## 2015-05-08 LAB — CBC WITH DIFFERENTIAL/PLATELET
BASO%: 0.8 % (ref 0.0–2.0)
Basophils Absolute: 0 10*3/uL (ref 0.0–0.1)
EOS%: 3 % (ref 0.0–7.0)
Eosinophils Absolute: 0.1 10*3/uL (ref 0.0–0.5)
HCT: 42.1 % (ref 38.4–49.9)
HGB: 14.4 g/dL (ref 13.0–17.1)
LYMPH%: 40.5 % (ref 14.0–49.0)
MCH: 30.2 pg (ref 27.2–33.4)
MCHC: 34.1 g/dL (ref 32.0–36.0)
MCV: 88.6 fL (ref 79.3–98.0)
MONO#: 0.4 10*3/uL (ref 0.1–0.9)
MONO%: 8.6 % (ref 0.0–14.0)
NEUT#: 2.1 10*3/uL (ref 1.5–6.5)
NEUT%: 47.1 % (ref 39.0–75.0)
Platelets: 211 10*3/uL (ref 140–400)
RBC: 4.75 10*6/uL (ref 4.20–5.82)
RDW: 12.9 % (ref 11.0–14.6)
WBC: 4.5 10*3/uL (ref 4.0–10.3)
lymph#: 1.8 10*3/uL (ref 0.9–3.3)

## 2015-05-09 LAB — PSA: PSA: 2.51 ng/mL (ref <=4.00)

## 2015-05-10 ENCOUNTER — Ambulatory Visit (HOSPITAL_BASED_OUTPATIENT_CLINIC_OR_DEPARTMENT_OTHER): Payer: BLUE CROSS/BLUE SHIELD | Admitting: Oncology

## 2015-05-10 ENCOUNTER — Ambulatory Visit (HOSPITAL_BASED_OUTPATIENT_CLINIC_OR_DEPARTMENT_OTHER): Payer: BLUE CROSS/BLUE SHIELD

## 2015-05-10 ENCOUNTER — Telehealth: Payer: Self-pay | Admitting: Oncology

## 2015-05-10 VITALS — BP 124/79 | HR 51 | Temp 97.5°F | Resp 16 | Ht 68.0 in | Wt 165.2 lb

## 2015-05-10 DIAGNOSIS — Z5111 Encounter for antineoplastic chemotherapy: Secondary | ICD-10-CM

## 2015-05-10 DIAGNOSIS — C61 Malignant neoplasm of prostate: Secondary | ICD-10-CM | POA: Diagnosis not present

## 2015-05-10 MED ORDER — LEUPROLIDE ACETATE (3 MONTH) 22.5 MG IM KIT
22.5000 mg | PACK | Freq: Once | INTRAMUSCULAR | Status: AC
  Administered 2015-05-10: 22.5 mg via INTRAMUSCULAR
  Filled 2015-05-10: qty 22.5

## 2015-05-10 NOTE — Progress Notes (Signed)
Hematology and Oncology Follow Up Visit  David Huffman [id number] 1961-04-02 54 y.o. 05/10/2015 9:04 AM  CC: [name], MD    Principle Diagnosis: A 54 year old gentleman with prostate cancer diagnosed in March 2008, Gleason score 7, PSA of 31.  Prior Therapy: 1. Status post prostatectomy done in 2008, pathology revealing T2c N0 with 0 out of 12 lymph nodes involved, pathology revealed 4 + 3 equals 7 Gleason score.  2. The patient continued to have rise in his PSA up to 54 without any measurable disease.  Current therapy:  He is on hormonal deprivation, Lupron 22.5 mg every 3 months on a continuous basis started in 2008.  PSA nadir down to 0.4 and then rose up to 2.42 in 05/2011. Casodex 50 mg daily added in 05/2011 and his PSA remains under control since that time.  Interim History:  David Huffman presents today for a followup visit by himself. Since the last visit, he reports no complaints. He continues to be very active and exercises regularly. He has injured his right groin at the beginning of the summer and hampered his activity some. He is back running at this time and was able to run for an extended period of time. Does not report any pathological fractures or bone pain.   He reports he is taking Casodex without complications at this time. He continues to work full time without any decline.He continues to be asymptomatic from prostate cancer.  He had not reported any chest pain.  Had not reported any back pain.  Had not reported any genitourinary complaints.  He had not really had any other decline in his energy or decline in his health. He has not reported any new illnesses or hospitalizations. No new complications related to androgen deprivation.   He does not report any headaches or blurry vision or drainage from his ear.Does not report any change in his bowel habits or nausea or vomiting. Did not report any neurological symptoms. Does not report any frequency urgency or hesitancy. Rest of  his review of systems unremarkable.  Medications: I have reviewed the patient's current medications. Current Outpatient Prescriptions  Medication Sig Dispense Refill  . Ascorbic Acid (VITAMIN C) 1000 MG tablet Take 1,000 mg by mouth daily.    Marland Kitchen b complex vitamins tablet Take 1 tablet by mouth daily.    . bicalutamide (CASODEX) 50 MG tablet Take 1 tablet (50 mg total) by mouth daily. 90 tablet 1  . Calcium Carbonate-Vitamin D (CALCIUM + D PO) Take 1 tablet by mouth daily.    . Lactase (LACTAID PO) Take by mouth daily as needed.    Marland Kitchen leuprolide (LUPRON) 22.5 MG injection Inject 22.5 mg into the muscle every 3 (three) months.    . Multiple Vitamins-Minerals (MULTIVITAMIN PO) Take 1 tablet by mouth daily.    Marland Kitchen VITAMIN D, ERGOCALCIFEROL, PO Take 1 tablet by mouth daily.     No current facility-administered medications for this visit.    Allergies: No Known Allergies  Past Medical History, Surgical history, Social history, and Family History were reviewed and updated.   Physical Exam: Blood pressure 124/79, pulse 51, temperature 97.5 F (36.4 C), temperature source Oral, resp. rate 16, height 5\' 8"  (1.727 m), weight 165 lb 3.2 oz (74.934 kg), SpO2 100 %. ECOG: 0 General appearance: alert awake not in any distress. Appears younger than stated age. Head: Normocephalic, without obvious abnormality Neck: no adenopathy Lymph nodes: Cervical, supraclavicular, and axillary nodes normal. Heart:regular rate and rhythm, S1, S2 normal,  no murmur, click, rub or gallop Lung:chest clear, no wheezing, rales, normal symmetric air entry Abdomin: soft, non-tender, without masses or organomegaly no shifting dullness or ascites. EXT:no erythema, induration, or nodules   Lab Results: Lab Results  Component Value Date   WBC 4.5 05/08/2015   HGB 14.4 05/08/2015   HCT 42.1 05/08/2015   MCV 88.6 05/08/2015   PLT 211 05/08/2015     Chemistry      Component Value Date/Time   NA 141 05/08/2015 0837    NA 139 03/15/2012 0911   NA 144 09/15/2011 0953   K 4.9 05/08/2015 0837   K 4.7 03/15/2012 0911   K 4.6 09/15/2011 0953   CL 106 01/03/2013 0916   CL 106 03/15/2012 0911   CL 103 09/15/2011 0953   CO2 28 05/08/2015 0837   CO2 26 03/15/2012 0911   CO2 28 09/15/2011 0953   BUN 14.4 05/08/2015 0837   BUN 15 03/15/2012 0911   BUN 15 09/15/2011 0953   CREATININE 0.9 05/08/2015 0837   CREATININE 0.92 03/15/2012 0911   CREATININE 0.9 09/15/2011 0953      Component Value Date/Time   CALCIUM 9.8 05/08/2015 0837   CALCIUM 9.5 03/15/2012 0911   CALCIUM 9.3 09/15/2011 0953   ALKPHOS 96 05/08/2015 0837   ALKPHOS 84 03/15/2012 0911   ALKPHOS 93* 09/15/2011 0953   AST 31 05/08/2015 0837   AST 107* 03/15/2012 0911   AST 60* 09/15/2011 0953   ALT 31 05/08/2015 0837   ALT 46 03/15/2012 0911   ALT 82* 09/15/2011 0953   BILITOT 1.66* 05/08/2015 0837   BILITOT 1.4* 03/15/2012 0911   BILITOT 1.80* 09/15/2011 0953         Results for David Huffman, David Huffman (MRN [id number]) as of 05/10/2015 09:06  Ref. Range 07/31/2014 08:16 10/30/2014 08:29 01/29/2015 08:49 05/08/2015 08:36  PSA Latest Ref Range: <=4.00 ng/mL 1.68 2.29 1.69 2.51      ASSESSMENT AND PLAN:    This is a pleasant 54 year old gentleman with the following issues:  1. 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 Casodex was added to his regimen in 05/2011. He tolerated well without any complications. His staging workup including a CT scan and a bone scan from March 2016 continues to show no evidence of disease. His last PSA from from 05/08/2015 was 2.51. This is a slight increase from 1.69 in June 2016. But the overall doubling time is more than 15 months. The plan is to continue with the same regimen and monitor his PSA closely. He will repeat staging studies in March 2017. If his PSA doubling time becomes 6 months or less or he develops measurable disease, we will change his treatment at that time. Other  hormonal agents would be an excellent choice such as Nicki Reaper or Provenge immunotherapy.  2. Androgen deprivation.  Will continue Lupron today and every 3 months. Next dose will on 07/2015. 3. Bone health.  Continue calcium and vitamin D supplementation.   4. Screening colonoscopy: this was completed in 2015.  5. Followup will be in 3 months' time.   [facility], MD 9/16/20169:04 AM

## 2015-05-10 NOTE — Telephone Encounter (Signed)
Pt confirmed labs/ov/inj per 09/16 POF, gave pt AVS and Calendar... KJ °

## 2015-07-03 ENCOUNTER — Other Ambulatory Visit: Payer: Self-pay | Admitting: *Deleted

## 2015-07-03 DIAGNOSIS — C61 Malignant neoplasm of prostate: Secondary | ICD-10-CM

## 2015-07-03 MED ORDER — BICALUTAMIDE 50 MG PO TABS: 50.0000 mg | ORAL_TABLET | Freq: Every day | ORAL | Status: DC

## 2015-08-07 ENCOUNTER — Other Ambulatory Visit (HOSPITAL_BASED_OUTPATIENT_CLINIC_OR_DEPARTMENT_OTHER): Payer: BLUE CROSS/BLUE SHIELD

## 2015-08-07 DIAGNOSIS — C61 Malignant neoplasm of prostate: Secondary | ICD-10-CM | POA: Diagnosis not present

## 2015-08-07 LAB — CBC WITH DIFFERENTIAL/PLATELET
BASO%: 0.6 % (ref 0.0–2.0)
BASOS ABS: 0 10*3/uL (ref 0.0–0.1)
EOS%: 4.4 % (ref 0.0–7.0)
Eosinophils Absolute: 0.2 10*3/uL (ref 0.0–0.5)
HEMATOCRIT: 43.7 % (ref 38.4–49.9)
HGB: 14.8 g/dL (ref 13.0–17.1)
LYMPH#: 1.9 10*3/uL (ref 0.9–3.3)
LYMPH%: 37.3 % (ref 14.0–49.0)
MCH: 29.8 pg (ref 27.2–33.4)
MCHC: 33.9 g/dL (ref 32.0–36.0)
MCV: 88.1 fL (ref 79.3–98.0)
MONO#: 0.4 10*3/uL (ref 0.1–0.9)
MONO%: 7.8 % (ref 0.0–14.0)
NEUT#: 2.5 10*3/uL (ref 1.5–6.5)
NEUT%: 49.9 % (ref 39.0–75.0)
Platelets: 229 10*3/uL (ref 140–400)
RBC: 4.96 10*6/uL (ref 4.20–5.82)
RDW: 12.5 % (ref 11.0–14.6)
WBC: 5 10*3/uL (ref 4.0–10.3)

## 2015-08-07 LAB — COMPREHENSIVE METABOLIC PANEL
ALK PHOS: 107 U/L (ref 40–150)
ALT: 35 U/L (ref 0–55)
AST: 34 U/L (ref 5–34)
Albumin: 4.2 g/dL (ref 3.5–5.0)
Anion Gap: 8 mEq/L (ref 3–11)
BUN: 18.3 mg/dL (ref 7.0–26.0)
CALCIUM: 10.1 mg/dL (ref 8.4–10.4)
CO2: 26 mEq/L (ref 22–29)
Chloride: 107 mEq/L (ref 98–109)
Creatinine: 1 mg/dL (ref 0.7–1.3)
EGFR: 88 mL/min/{1.73_m2} — ABNORMAL LOW (ref >90)
Glucose: 68 mg/dl — ABNORMAL LOW (ref 70–140)
POTASSIUM: 4.5 meq/L (ref 3.5–5.1)
Sodium: 141 mEq/L (ref 136–145)
Total Bilirubin: 1.36 mg/dL — ABNORMAL HIGH (ref 0.20–1.20)
Total Protein: 7.7 g/dL (ref 6.4–8.3)

## 2015-08-08 LAB — PSA: PSA: 3.34 ng/mL (ref <=4.00)

## 2015-08-09 ENCOUNTER — Ambulatory Visit (HOSPITAL_BASED_OUTPATIENT_CLINIC_OR_DEPARTMENT_OTHER): Payer: BLUE CROSS/BLUE SHIELD

## 2015-08-09 ENCOUNTER — Ambulatory Visit (HOSPITAL_BASED_OUTPATIENT_CLINIC_OR_DEPARTMENT_OTHER): Payer: BLUE CROSS/BLUE SHIELD | Admitting: Oncology

## 2015-08-09 ENCOUNTER — Telehealth: Payer: Self-pay | Admitting: Oncology

## 2015-08-09 VITALS — BP 111/59 | HR 62 | Temp 97.5°F | Resp 20 | Ht 68.0 in | Wt 168.5 lb

## 2015-08-09 DIAGNOSIS — C61 Malignant neoplasm of prostate: Secondary | ICD-10-CM

## 2015-08-09 DIAGNOSIS — Z5111 Encounter for antineoplastic chemotherapy: Secondary | ICD-10-CM | POA: Diagnosis not present

## 2015-08-09 DIAGNOSIS — E291 Testicular hypofunction: Secondary | ICD-10-CM

## 2015-08-09 MED ORDER — LEUPROLIDE ACETATE (3 MONTH) 22.5 MG IM KIT
22.5000 mg | PACK | Freq: Once | INTRAMUSCULAR | Status: AC
  Administered 2015-08-09: 22.5 mg via INTRAMUSCULAR
  Filled 2015-08-09: qty 22.5

## 2015-08-09 NOTE — Telephone Encounter (Signed)
Gave and prnted appt sched and avs for pt for March....gv barium

## 2015-08-09 NOTE — Progress Notes (Signed)
Hematology and Oncology Follow Up Visit  Aiken Aragona [id number] 04-04-1961 54 y.o. 08/09/2015 9:48 AM  CC: [name], MD    Principle Diagnosis: A 54 year old gentleman with prostate cancer diagnosed in March 2008, Gleason score 7, PSA of 31.  Prior Therapy: 1. Status post prostatectomy done in 2008, pathology revealing T2c N0 with 0 out of 12 lymph nodes involved, pathology revealed 4 + 3 equals 7 Gleason score.  2. The patient continued to have rise in his PSA up to 54 without any measurable disease.  Current therapy:  He is on hormonal deprivation, Lupron 22.5 mg every 3 months on a continuous basis started in 2008.  PSA nadir down to 0.4 and then rose up to 2.42 in 05/2011. Casodex 50 mg daily added in 05/2011 and his PSA remains under control since that time.  Interim History:  Mr. Salmela presents today for a followup visit with his wife. Since the last visit, he continues to do well without any major complaints. He reports missing Casodex for about 2 weeks in the month of November but haven't resumed it regularly as of late. He continues to be very active and exercises regularly. Does not report any pathological fractures or bone pain. He works full time and have been very busy at work.  He reports he is taking Casodex without complications at this time  He had not really had any other decline in his energy or decline in his health. He has not reported any new illnesses or hospitalizations. No new complications related to androgen deprivation. Did not any hot flashes, joint tenderness or GI complications.  He does not report any headaches or blurry vision.does not report any fevers, chills, sweats or weight loss. Does that report any chest pain, palpitation orthopnea. Does not report any change in his bowel habits or nausea or vomiting. Did not report any neurological symptoms. Does not report any frequency urgency or hesitancy. Rest of his review of systems unremarkable.  Medications:  I have reviewed the patient's current medications. Current Outpatient Prescriptions  Medication Sig Dispense Refill  . Ascorbic Acid (VITAMIN C) 1000 MG tablet Take 1,000 mg by mouth daily.    Marland Kitchen b complex vitamins tablet Take 1 tablet by mouth daily.    . bicalutamide (CASODEX) 50 MG tablet Take 1 tablet (50 mg total) by mouth daily. 90 tablet 1  . Calcium Carbonate-Vitamin D (CALCIUM + D PO) Take 1 tablet by mouth daily.    . Lactase (LACTAID PO) Take by mouth daily as needed.    Marland Kitchen leuprolide (LUPRON) 22.5 MG injection Inject 22.5 mg into the muscle every 3 (three) months.    . Multiple Vitamins-Minerals (MULTIVITAMIN PO) Take 1 tablet by mouth daily.    Marland Kitchen VITAMIN D, ERGOCALCIFEROL, PO Take 1 tablet by mouth daily.     No current facility-administered medications for this visit.    Allergies: No Known Allergies  Past Medical History, Surgical history, Social history, and Family History were reviewed and updated.   Physical Exam: Blood pressure 111/59, pulse 62, temperature 97.5 F (36.4 C), temperature source Oral, resp. rate 20, height 5\' 8"  (1.727 m), weight 168 lb 8 oz (76.431 kg), SpO2 100 %. ECOG: 0 General appearance: alert awake well-appearing gentleman without distress. Head: Normocephalic, without obvious abnormality no oral ulcers or lesions. Neck: no adenopathy Lymph nodes: Cervical, supraclavicular, and axillary nodes normal. Heart:regular rate and rhythm, S1, S2 normal, no murmur, click, rub or gallop Lung:chest clear, no wheezing, rales, normal symmetric air  entry Abdomin: soft, non-tender, without masses or organomegaly no shifting dullness or ascites. EXT:no erythema, induration, or nodules   Lab Results: Lab Results  Component Value Date   WBC 5.0 08/07/2015   HGB 14.8 08/07/2015   HCT 43.7 08/07/2015   MCV 88.1 08/07/2015   PLT 229 08/07/2015     Chemistry      Component Value Date/Time   NA 141 08/07/2015 0821   NA 139 03/15/2012 0911   NA 144  09/15/2011 0953   K 4.5 08/07/2015 0821   K 4.7 03/15/2012 0911   K 4.6 09/15/2011 0953   CL 106 01/03/2013 0916   CL 106 03/15/2012 0911   CL 103 09/15/2011 0953   CO2 26 08/07/2015 0821   CO2 26 03/15/2012 0911   CO2 28 09/15/2011 0953   BUN 18.3 08/07/2015 0821   BUN 15 03/15/2012 0911   BUN 15 09/15/2011 0953   CREATININE 1.0 08/07/2015 0821   CREATININE 0.92 03/15/2012 0911   CREATININE 0.9 09/15/2011 0953      Component Value Date/Time   CALCIUM 10.1 08/07/2015 0821   CALCIUM 9.5 03/15/2012 0911   CALCIUM 9.3 09/15/2011 0953   ALKPHOS 107 08/07/2015 0821   ALKPHOS 84 03/15/2012 0911   ALKPHOS 93* 09/15/2011 0953   AST 34 08/07/2015 0821   AST 107* 03/15/2012 0911   AST 60* 09/15/2011 0953   ALT 35 08/07/2015 0821   ALT 46 03/15/2012 0911   ALT 82* 09/15/2011 0953   BILITOT 1.36* 08/07/2015 0821   BILITOT 1.4* 03/15/2012 0911   BILITOT 1.80* 09/15/2011 0953      Results for LOGYN, CRAIG (MRN [id number]) as of 08/09/2015 09:12  Ref. Range 01/29/2015 08:49 05/08/2015 08:36 08/07/2015 08:21  PSA Latest Ref Range: <=4.00 ng/mL 1.69 2.51 3.34       ASSESSMENT AND PLAN:    This is a pleasant 54 year old gentleman with the following issues:  1. 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 Casodex was added to his regimen in 05/2011. He tolerated well without any complications. His staging workup including a CT scan and a bone scan from March 2016 continues to show no evidence of disease. His last PSA from from 08/07/2015 was slightly elevated to 3.34. The slight increase still puts PSA doubling time over 6 months and you also missed 2 weeks of this medication. The plan is to continue with the same medication and repeat PSA in 3 months. He will also have staging workup including CT scan and bone scan. His PSA starts to rise with a doubling time of less than 6 months or he has measurable disease different salvage therapy will be used at  that time. This will be in the form of ketoconazole, and Xtandi or Zytiga.  2. Androgen deprivation.  Will continue Lupron today and every 3 months. Next injection will be on 08/09/2016. 3. Bone health.  Continue calcium and vitamin D supplementation.   4. Screening colonoscopy: this was completed in 2015.  5. Followup will be in 3 months' time.   [facility], MD 12/16/20169:48 AM

## 2015-10-01 ENCOUNTER — Other Ambulatory Visit: Payer: Self-pay | Admitting: *Deleted

## 2015-10-01 DIAGNOSIS — C61 Malignant neoplasm of prostate: Secondary | ICD-10-CM

## 2015-10-01 MED ORDER — BICALUTAMIDE 50 MG PO TABS: 50.0000 mg | ORAL_TABLET | Freq: Every day | ORAL | Status: DC

## 2015-11-05 ENCOUNTER — Other Ambulatory Visit (HOSPITAL_BASED_OUTPATIENT_CLINIC_OR_DEPARTMENT_OTHER): Payer: BLUE CROSS/BLUE SHIELD

## 2015-11-05 ENCOUNTER — Encounter (HOSPITAL_COMMUNITY)
Admission: RE | Admit: 2015-11-05 | Discharge: 2015-11-05 | Disposition: A | Discharge status: Home / Self Care | Payer: BLUE CROSS/BLUE SHIELD | Source: Ambulatory Visit | Attending: Oncology | Admitting: Oncology

## 2015-11-05 ENCOUNTER — Encounter (HOSPITAL_COMMUNITY): Payer: Self-pay

## 2015-11-05 ENCOUNTER — Ambulatory Visit (HOSPITAL_COMMUNITY)
Admission: RE | Admit: 2015-11-05 | Discharge: 2015-11-05 | Disposition: A | Discharge status: Home / Self Care | Payer: BLUE CROSS/BLUE SHIELD | Source: Ambulatory Visit | Attending: Oncology | Admitting: Oncology

## 2015-11-05 DIAGNOSIS — Z9079 Acquired absence of other genital organ(s): Secondary | ICD-10-CM | POA: Insufficient documentation

## 2015-11-05 DIAGNOSIS — C61 Malignant neoplasm of prostate: Secondary | ICD-10-CM | POA: Insufficient documentation

## 2015-11-05 LAB — COMPREHENSIVE METABOLIC PANEL WITH GFR
ALT: 35 U/L (ref 0–55)
AST: 29 U/L (ref 5–34)
Albumin: 4.1 g/dL (ref 3.5–5.0)
Alkaline Phosphatase: 98 U/L (ref 40–150)
Anion Gap: 7 meq/L (ref 3–11)
BUN: 16.8 mg/dL (ref 7.0–26.0)
CO2: 28 meq/L (ref 22–29)
Calcium: 9.5 mg/dL (ref 8.4–10.4)
Chloride: 105 meq/L (ref 98–109)
Creatinine: 0.9 mg/dL (ref 0.7–1.3)
EGFR: >90 ml/min/1.73 m2
Glucose: 86 mg/dL (ref 70–140)
Potassium: 4.5 meq/L (ref 3.5–5.1)
Sodium: 139 meq/L (ref 136–145)
Total Bilirubin: 1.66 mg/dL — ABNORMAL HIGH (ref 0.20–1.20)
Total Protein: 7.5 g/dL (ref 6.4–8.3)

## 2015-11-05 LAB — CBC WITH DIFFERENTIAL/PLATELET
BASO%: 0.8 % (ref 0.0–2.0)
Basophils Absolute: 0 10*3/uL (ref 0.0–0.1)
EOS ABS: 0.1 10*3/uL (ref 0.0–0.5)
EOS%: 3.1 % (ref 0.0–7.0)
HCT: 44 % (ref 38.4–49.9)
HGB: 14.7 g/dL (ref 13.0–17.1)
LYMPH%: 33.9 % (ref 14.0–49.0)
MCH: 29.4 pg (ref 27.2–33.4)
MCHC: 33.4 g/dL (ref 32.0–36.0)
MCV: 88 fL (ref 79.3–98.0)
MONO#: 0.4 10*3/uL (ref 0.1–0.9)
MONO%: 7.7 % (ref 0.0–14.0)
NEUT#: 2.6 10*3/uL (ref 1.5–6.5)
NEUT%: 54.5 % (ref 39.0–75.0)
Platelets: 189 10*3/uL (ref 140–400)
RBC: 5 10*6/uL (ref 4.20–5.82)
RDW: 13 % (ref 11.0–14.6)
WBC: 4.8 10*3/uL (ref 4.0–10.3)
lymph#: 1.6 10*3/uL (ref 0.9–3.3)

## 2015-11-05 MED ORDER — TECHNETIUM TC 99M MEDRONATE IV KIT: 25.0000 | PACK | Freq: Once | INTRAVENOUS | Status: DC | PRN

## 2015-11-05 MED ORDER — IOHEXOL 300 MG/ML  SOLN
100.0000 mL | Freq: Once | INTRAMUSCULAR | Status: AC | PRN
  Administered 2015-11-05: 100 mL via INTRAVENOUS

## 2015-11-06 ENCOUNTER — Other Ambulatory Visit: Payer: BLUE CROSS/BLUE SHIELD

## 2015-11-06 LAB — PSA (PARALLEL TESTING): PSA: 3.95 ng/mL (ref <=4.00)

## 2015-11-06 LAB — TESTOSTERONE: Testosterone, Serum: 5 ng/dL — ABNORMAL LOW (ref 348–1197)

## 2015-11-06 LAB — PSA: Prostate Specific Ag, Serum: 3.7 ng/mL (ref 0.0–4.0)

## 2015-11-08 ENCOUNTER — Ambulatory Visit (HOSPITAL_BASED_OUTPATIENT_CLINIC_OR_DEPARTMENT_OTHER): Payer: BLUE CROSS/BLUE SHIELD

## 2015-11-08 ENCOUNTER — Telehealth: Payer: Self-pay | Admitting: Oncology

## 2015-11-08 ENCOUNTER — Ambulatory Visit (HOSPITAL_BASED_OUTPATIENT_CLINIC_OR_DEPARTMENT_OTHER): Payer: BLUE CROSS/BLUE SHIELD | Admitting: Oncology

## 2015-11-08 VITALS — BP 158/80 | HR 78 | Temp 97.8°F | Resp 18 | Ht 68.0 in | Wt 167.5 lb

## 2015-11-08 DIAGNOSIS — C61 Malignant neoplasm of prostate: Secondary | ICD-10-CM

## 2015-11-08 DIAGNOSIS — E291 Testicular hypofunction: Secondary | ICD-10-CM

## 2015-11-08 DIAGNOSIS — Z5111 Encounter for antineoplastic chemotherapy: Secondary | ICD-10-CM | POA: Diagnosis not present

## 2015-11-08 MED ORDER — LEUPROLIDE ACETATE (3 MONTH) 22.5 MG IM KIT
22.5000 mg | PACK | Freq: Once | INTRAMUSCULAR | Status: AC
  Administered 2015-11-08: 22.5 mg via INTRAMUSCULAR
  Filled 2015-11-08: qty 22.5

## 2015-11-08 NOTE — Progress Notes (Signed)
Hematology and Oncology Follow Up Visit  David Huffman [id number] 1961/02/26 55 y.o. 11/08/2015 3:15 PM  CC: David Messick, MD    Principle Diagnosis: A 55 year old gentleman with prostate cancer diagnosed in March 2008, Gleason score 7, PSA of 31.  Prior Therapy: 1. Status post prostatectomy done in 2008, pathology revealing T2c N0 with 0 out of 12 lymph nodes involved, pathology revealed 4 + 3 equals 7 Gleason score.  2. The patient continued to have rise in his PSA up to 52 without any measurable disease.  Current therapy:  He is on hormonal deprivation, Lupron 22.5 mg every 3 months on a continuous basis started in 2008.  PSA nadir down to 0.4 and then rose up to 2.42 in 05/2011. Casodex 50 mg daily added in 05/2011. PSA remains controlled at this time.  Interim History:  David Huffman presents today for a followup visit with his wife. Since the last visit, he reports no recent illnesses or hospitalizations. He continues to be asymptomatic and remains active. Does not report any pathological fractures or bone pain. He works full time exercises regularly.  He reports he is taking Casodex without complications at this time  He had not really had any other decline in his energy or decline in his health. No new complications related to androgen deprivation. Did not any hot flashes, joint tenderness or GI complications.  He does not report any headaches or blurry vision.does not report any fevers, chills, sweats or weight loss. Does that report any chest pain, palpitation orthopnea. Does not report any change in his bowel habits or nausea or vomiting. Did not report any neurological symptoms. Does not report any frequency urgency or hesitancy. Rest of his review of systems unremarkable.  Medications: I have reviewed the patient's current medications. Current Outpatient Prescriptions  Medication Sig Dispense Refill  . Ascorbic Acid (VITAMIN C) 1000 MG tablet Take 1,000 mg by mouth daily.    Marland Kitchen b  complex vitamins tablet Take 1 tablet by mouth daily.    . bicalutamide (CASODEX) 50 MG tablet Take 1 tablet (50 mg total) by mouth daily. 90 tablet 0  . Calcium Carbonate-Vitamin D (CALCIUM + D PO) Take 1 tablet by mouth daily.    . Lactase (LACTAID PO) Take by mouth daily as needed.    Marland Kitchen leuprolide (LUPRON) 22.5 MG injection Inject 22.5 mg into the muscle every 3 (three) months.    . Multiple Vitamins-Minerals (MULTIVITAMIN PO) Take 1 tablet by mouth daily.    Marland Kitchen VITAMIN D, ERGOCALCIFEROL, PO Take 1 tablet by mouth daily.     No current facility-administered medications for this visit.   Facility-Administered Medications Ordered in Other Visits  Medication Dose Route Frequency Provider Last Rate Last Dose  . leuprolide (LUPRON) injection 22.5 mg  22.5 mg Intramuscular Once David Portela, MD      . technetium medronate (TC-MDP) injection 25 milli Curie  25 milli Curie Intravenous Once PRN David Portela, MD        Allergies: No Known Allergies  Past Medical History, Surgical history, Social history, and Family History were reviewed and updated.   Physical Exam: Blood pressure 158/80, pulse 78, temperature 97.8 F (36.6 C), temperature source Oral, resp. rate 18, height 5\' 8"  (1.727 m), weight 167 lb 8 oz (75.978 kg), SpO2 100 %. ECOG: 0 General appearance: alert awake without distress. Head: Normocephalic, without obvious abnormality no oral thrush noted. Neck: no adenopathy Lymph nodes: Cervical, supraclavicular, and axillary nodes normal. Heart:regular rate and  rhythm, S1, S2 normal, no murmur, click, rub or gallop Lung:chest clear, no wheezing, rales, normal symmetric air entry Abdomin: soft, non-tender, without masses or organomegaly no rebound or guarding. EXT:no erythema, induration, or nodules   Lab Results: Lab Results  Component Value Date   WBC 4.8 11/05/2015   HGB 14.7 11/05/2015   HCT 44.0 11/05/2015   MCV 88.0 11/05/2015   PLT 189 11/05/2015     Chemistry       Component Value Date/Time   NA 139 11/05/2015 0952   NA 139 03/15/2012 0911   NA 144 09/15/2011 0953   K 4.5 11/05/2015 0952   K 4.7 03/15/2012 0911   K 4.6 09/15/2011 0953   CL 106 01/03/2013 0916   CL 106 03/15/2012 0911   CL 103 09/15/2011 0953   CO2 28 11/05/2015 0952   CO2 26 03/15/2012 0911   CO2 28 09/15/2011 0953   BUN 16.8 11/05/2015 0952   BUN 15 03/15/2012 0911   BUN 15 09/15/2011 0953   CREATININE 0.9 11/05/2015 0952   CREATININE 0.92 03/15/2012 0911   CREATININE 0.9 09/15/2011 0953      Component Value Date/Time   CALCIUM 9.5 11/05/2015 0952   CALCIUM 9.5 03/15/2012 0911   CALCIUM 9.3 09/15/2011 0953   ALKPHOS 98 11/05/2015 0952   ALKPHOS 84 03/15/2012 0911   ALKPHOS 93* 09/15/2011 0953   AST 29 11/05/2015 0952   AST 107* 03/15/2012 0911   AST 60* 09/15/2011 0953   ALT 35 11/05/2015 0952   ALT 46 03/15/2012 0911   ALT 82* 09/15/2011 0953   BILITOT 1.66* 11/05/2015 0952   BILITOT 1.4* 03/15/2012 0911   BILITOT 1.80* 09/15/2011 0953      Results for David Huffman (MRN [id number]) as of 11/08/2015 15:17  Ref. Range 08/07/2015 08:21 11/05/2015 09:52 11/05/2015 09:52  PSA Latest Ref Range: 0.0-4.0 ng/mL 3.34 3.95 3.7     EXAM: CT ABDOMEN AND PELVIS WITH CONTRAST  TECHNIQUE: Multidetector CT imaging of the abdomen and pelvis was performed using the standard protocol following bolus administration of intravenous contrast.  CONTRAST: 195mL OMNIPAQUE IOHEXOL 300 MG/ML SOLN  COMPARISON: CT abdomen pelvis and whole-body bone scan dated 10/30/2014  FINDINGS: Lower chest: Mild dependent atelectasis in the bilateral lung bases.  Hepatobiliary: Liver is notable for a 5 mm cyst in the medial segment left hepatic lobe. No suspicious/enhancing hepatic lesions.  Gallbladder is unremarkable. No intrahepatic or extrahepatic ductal dilatation.  Pancreas: Within normal limits.  Spleen: Within normal limits.  Adrenals/Urinary Tract: Adrenal glands  are within normal limits.  Kidneys are within normal limits. No hydronephrosis.  Bladder is within normal limits.  Stomach/Bowel: Stomach is notable for a tiny hiatal hernia.  No evidence of bowel obstruction.  Normal appendix.  Vascular/Lymphatic: No evidence of abdominal aortic aneurysm.  Circumaortic left renal vein.  No suspicious abdominopelvic lymphadenopathy.  Stable right external iliac nodes measuring 7-9 mm short axis. (series 2/images 72 and 77).  Reproductive: Status post prostatectomy.  Other: No abdominopelvic ascites.  Musculoskeletal: Mild degenerative changes of the visualized thoracolumbar spine.  Subtle sclerosis along the right inferior pubic ramus/parasymphyseal region (series 2/ image 84), chronic.  No findings suspicious for osseous metastases. Correlate with pending bone scan.  IMPRESSION: Status post prostatectomy.  No findings suspicious for metastatic disease. Small right external iliac lymph nodes are unchanged.  Correlate with pending bone scan.   EXAM: NUCLEAR MEDICINE WHOLE BODY BONE SCAN  TECHNIQUE: Whole body anterior and posterior images were obtained approximately 3  hours after intravenous injection of radiopharmaceutical.  RADIOPHARMACEUTICALS: 25 mCi Technetium-41m MDP IV  COMPARISON: Whole-body bone scans 10/30/2014 and earlier, dating back to 09/22/2006. Bone window images from CT abdomen pelvis performed subsequently today.  FINDINGS: No focal abnormal osseous activity to suggest metastatic disease. Mild degenerative activity in the acromioclavicular joints, sternoclavicular joints, lower lumbar spine and right midfoot.  Physiologic excretion into the urinary tract.  IMPRESSION: No evidence of osseous metastatic disease.    ASSESSMENT AND PLAN:    This is a pleasant 55 year old gentleman with the following issues:  1. Prostate cancer, developed biochemical relapse despite castrate  level of testosterone. Casodex was added to his regimen in 05/2011. He tolerated well without any complications. His staging workup including a CT scan and a bone scan from March 2017 was reviewed today and continues to show no evidence of disease. His last PSA showed slight elevation up to 3.7 with a PSA doubling time of 16 months. I have recommended continuing the same dose and schedule of Casodex unless he develops symptomatic progression or develops rapid doubling, PSA less than 6 months. Clearly if he develops measurable disease on imaging studies different therapy will be needed. This will be in the form of ketoconazole, and Xtandi or Zytiga.  2. Androgen deprivation.  Will continue Lupron today and every 3 months.  3. Bone health.  Continue calcium and vitamin D supplementation.   4. Screening colonoscopy: this was completed in 2015.  5. Followup will be in 3 months' time.   David Button, MD 3/17/20173:15 PM

## 2015-11-08 NOTE — Telephone Encounter (Signed)
Gave and printed appt sched and avs fo rpt for June...the patient on vacation the week of the 20th

## 2016-01-07 ENCOUNTER — Other Ambulatory Visit: Payer: Self-pay | Admitting: *Deleted

## 2016-01-07 DIAGNOSIS — C61 Malignant neoplasm of prostate: Secondary | ICD-10-CM

## 2016-01-07 MED ORDER — BICALUTAMIDE 50 MG PO TABS: 50.0000 mg | ORAL_TABLET | Freq: Every day | ORAL | Status: DC

## 2016-01-07 NOTE — Telephone Encounter (Signed)
Refill for casodex e-scribed to [facility], per patient's request

## 2016-02-10 ENCOUNTER — Telehealth: Payer: Self-pay | Admitting: Oncology

## 2016-02-10 NOTE — Telephone Encounter (Signed)
PAL----- per FS moved 6/28 lab to 6/21 and 6/30 f/u to 6/28. Left message for patient on both home/cell phones. Schedule mailed.

## 2016-02-12 ENCOUNTER — Other Ambulatory Visit: Payer: BLUE CROSS/BLUE SHIELD

## 2016-02-14 ENCOUNTER — Encounter: Payer: Self-pay | Admitting: *Deleted

## 2016-02-14 ENCOUNTER — Telehealth: Payer: Self-pay | Admitting: Oncology

## 2016-02-14 ENCOUNTER — Other Ambulatory Visit: Payer: BLUE CROSS/BLUE SHIELD

## 2016-02-14 ENCOUNTER — Ambulatory Visit: Payer: BLUE CROSS/BLUE SHIELD

## 2016-02-14 ENCOUNTER — Other Ambulatory Visit: Payer: Self-pay | Admitting: Oncology

## 2016-02-14 ENCOUNTER — Ambulatory Visit: Payer: BLUE CROSS/BLUE SHIELD | Admitting: Oncology

## 2016-02-14 NOTE — Telephone Encounter (Signed)
s.w. pt and r/s appts.Marland KitchenMarland KitchenMarland KitchenMarland Kitchenpt ok and aware

## 2016-02-17 ENCOUNTER — Other Ambulatory Visit (HOSPITAL_BASED_OUTPATIENT_CLINIC_OR_DEPARTMENT_OTHER): Payer: BLUE CROSS/BLUE SHIELD

## 2016-02-17 DIAGNOSIS — C61 Malignant neoplasm of prostate: Secondary | ICD-10-CM | POA: Diagnosis not present

## 2016-02-17 LAB — COMPREHENSIVE METABOLIC PANEL
ALBUMIN: 4.3 g/dL (ref 3.5–5.0)
ALK PHOS: 101 U/L (ref 40–150)
ALT: 36 U/L (ref 0–55)
ANION GAP: 8 meq/L (ref 3–11)
AST: 32 U/L (ref 5–34)
BILIRUBIN TOTAL: 1.37 mg/dL — AB (ref 0.20–1.20)
BUN: 18.7 mg/dL (ref 7.0–26.0)
CALCIUM: 10 mg/dL (ref 8.4–10.4)
CO2: 26 mEq/L (ref 22–29)
CREATININE: 1 mg/dL (ref 0.7–1.3)
Chloride: 105 mEq/L (ref 98–109)
EGFR: 86 mL/min/{1.73_m2} — AB (ref >90)
Glucose: 96 mg/dl (ref 70–140)
Potassium: 4.9 mEq/L (ref 3.5–5.1)
Sodium: 139 mEq/L (ref 136–145)
TOTAL PROTEIN: 7.7 g/dL (ref 6.4–8.3)

## 2016-02-17 LAB — CBC WITH DIFFERENTIAL/PLATELET
BASO%: 0.5 % (ref 0.0–2.0)
BASOS ABS: 0 10*3/uL (ref 0.0–0.1)
EOS ABS: 0.1 10*3/uL (ref 0.0–0.5)
EOS%: 2.2 % (ref 0.0–7.0)
HEMATOCRIT: 43.6 % (ref 38.4–49.9)
HEMOGLOBIN: 15.4 g/dL (ref 13.0–17.1)
LYMPH%: 35.7 % (ref 14.0–49.0)
MCH: 30.5 pg (ref 27.2–33.4)
MCHC: 35.3 g/dL (ref 32.0–36.0)
MCV: 86.3 fL (ref 79.3–98.0)
MONO#: 0.4 10*3/uL (ref 0.1–0.9)
MONO%: 7.4 % (ref 0.0–14.0)
NEUT#: 3.2 10*3/uL (ref 1.5–6.5)
NEUT%: 54.2 % (ref 39.0–75.0)
NRBC: 0 % (ref 0–0)
PLATELETS: 198 10*3/uL (ref 140–400)
RBC: 5.05 10*6/uL (ref 4.20–5.82)
RDW: 12.7 % (ref 11.0–14.6)
WBC: 5.8 10*3/uL (ref 4.0–10.3)
lymph#: 2.1 10*3/uL (ref 0.9–3.3)

## 2016-02-18 LAB — PSA: PROSTATE SPECIFIC AG, SERUM: 3 ng/mL (ref 0.0–4.0)

## 2016-02-18 LAB — TESTOSTERONE: TESTOSTERONE: 34 ng/dL — AB (ref 348–1197)

## 2016-02-19 ENCOUNTER — Other Ambulatory Visit: Payer: BLUE CROSS/BLUE SHIELD

## 2016-02-20 ENCOUNTER — Telehealth: Payer: Self-pay | Admitting: Oncology

## 2016-02-20 ENCOUNTER — Ambulatory Visit (HOSPITAL_BASED_OUTPATIENT_CLINIC_OR_DEPARTMENT_OTHER): Payer: BLUE CROSS/BLUE SHIELD | Admitting: Oncology

## 2016-02-20 ENCOUNTER — Ambulatory Visit (HOSPITAL_BASED_OUTPATIENT_CLINIC_OR_DEPARTMENT_OTHER): Payer: BLUE CROSS/BLUE SHIELD

## 2016-02-20 VITALS — BP 116/78 | HR 58 | Temp 97.7°F | Resp 20 | Ht 68.0 in | Wt 168.6 lb

## 2016-02-20 DIAGNOSIS — M791 Myalgia: Secondary | ICD-10-CM

## 2016-02-20 DIAGNOSIS — E291 Testicular hypofunction: Secondary | ICD-10-CM | POA: Diagnosis not present

## 2016-02-20 DIAGNOSIS — C61 Malignant neoplasm of prostate: Secondary | ICD-10-CM

## 2016-02-20 DIAGNOSIS — Z5111 Encounter for antineoplastic chemotherapy: Secondary | ICD-10-CM

## 2016-02-20 MED ORDER — LEUPROLIDE ACETATE (4 MONTH) 30 MG IM KIT
22.5000 mg | PACK | Freq: Once | INTRAMUSCULAR | Status: AC
  Administered 2016-02-20: 22.5 mg via INTRAMUSCULAR
  Filled 2016-02-20: qty 30

## 2016-02-20 NOTE — Telephone Encounter (Signed)
Gave and printed appt sched and avs for pt for Sept °

## 2016-02-20 NOTE — Patient Instructions (Signed)
Leuprolide depot injection What is this medicine? LEUPROLIDE (loo PROE lide) is a man-made protein that acts like a natural hormone in the body. It decreases testosterone in men and decreases estrogen in women. In men, this medicine is used to treat advanced prostate cancer. In women, some forms of this medicine may be used to treat endometriosis, uterine fibroids, or other male hormone-related problems. This medicine may be used for other purposes; ask your health care provider or pharmacist if you have questions. What should I tell my health care provider before I take this medicine? They need to know if you have any of these conditions: -diabetes -heart disease or previous heart attack -high blood pressure -high cholesterol -osteoporosis -pain or difficulty passing urine -spinal cord metastasis -stroke -tobacco smoker -unusual vaginal bleeding (women) -an unusual or allergic reaction to leuprolide, benzyl alcohol, other medicines, foods, dyes, or preservatives -pregnant or trying to get pregnant -breast-feeding How should I use this medicine? This medicine is for injection into a muscle or for injection under the skin. It is given by a health care professional in a hospital or clinic setting. The specific product will determine how it will be given to you. Make sure you understand which product you receive and how often you will receive it. Talk to your pediatrician regarding the use of this medicine in children. Special care may be needed. Overdosage: If you think you have taken too much of this medicine contact a poison control center or emergency room at once. NOTE: This medicine is only for you. Do not share this medicine with others. What if I miss a dose? It is important not to miss a dose. Call your doctor or health care professional if you are unable to keep an appointment. Depot injections: Depot injections are given either once-monthly, every 12 weeks, every 16 weeks, or  every 24 weeks depending on the product you are prescribed. The product you are prescribed will be based on if you are male or male, and your condition. Make sure you understand your product and dosing. What may interact with this medicine? Do not take this medicine with any of the following medications: -chasteberry This medicine may also interact with the following medications: -herbal or dietary supplements, like black cohosh or DHEA -male hormones, like estrogens or progestins and birth control pills, patches, rings, or injections -male hormones, like testosterone This list may not describe all possible interactions. Give your health care provider a list of all the medicines, herbs, non-prescription drugs, or dietary supplements you use. Also tell them if you smoke, drink alcohol, or use illegal drugs. Some items may interact with your medicine. What should I watch for while using this medicine? Visit your doctor or health care professional for regular checks on your progress. During the first weeks of treatment, your symptoms may get worse, but then will improve as you continue your treatment. You may get hot flashes, increased bone pain, increased difficulty passing urine, or an aggravation of nerve symptoms. Discuss these effects with your doctor or health care professional, some of them may improve with continued use of this medicine. Male patients may experience a menstrual cycle or spotting during the first months of therapy with this medicine. If this continues, contact your doctor or health care professional. What side effects may I notice from receiving this medicine? Side effects that you should report to your doctor or health care professional as soon as possible: -allergic reactions like skin rash, itching or hives, swelling of the   face, lips, or tongue -breathing problems -chest pain -depression or memory disorders -pain in your legs or groin -pain at site where injected or  implanted -severe headache -swelling of the feet and legs -visual changes -vomiting Side effects that usually do not require medical attention (report to your doctor or health care professional if they continue or are bothersome): -breast swelling or tenderness -decrease in sex drive or performance -diarrhea -hot flashes -loss of appetite -muscle, joint, or bone pains -nausea -redness or irritation at site where injected or implanted -skin problems or acne This list may not describe all possible side effects. Call your doctor for medical advice about side effects. You may report side effects to FDA at 1-800-FDA-1088. Where should I keep my medicine? This drug is given in a hospital or clinic and will not be stored at home. NOTE: This sheet is a summary. It may not cover all possible information. If you have questions about this medicine, talk to your doctor, pharmacist, or health care provider.    2016, Elsevier/Gold Standard. (2014-05-04 14:16:23)  

## 2016-02-20 NOTE — Progress Notes (Signed)
Hematology and Oncology Follow Up Visit  David Huffman [id number] 1960/09/26 55 y.o. 02/20/2016 10:49 AM  CC: David Messick, MD    Principle Diagnosis: A 55 year old gentleman with prostate cancer diagnosed in March 2008, Gleason score 7, PSA of 31.  Prior Therapy: 1. Status post prostatectomy done in 2008, pathology revealing T2c N0 with 0 out of 12 lymph nodes involved, pathology revealed 4 + 3 equals 7 Gleason score.  2. The patient continued to have rise in his PSA up to 52 without any measurable disease.  Current therapy:  He is on hormonal deprivation, Lupron 22.5 mg every 3 months on a continuous basis started in 2008.  PSA nadir down to 0.4 and then rose up to 2.42 in 05/2011. Casodex 50 mg daily added in 05/2011. PSA remains controlled at this time.  Interim History:  David Huffman presents today for a followup visit with his wife. Since the last visit, he reports feeling well without any major changes in his health.  He continues to be asymptomatic and remains active. Does not report any pathological fractures or bone pain. He works full time exercises regularly. He does report periodic pain in his right gluteal muscle without any rectal or anal discomfort. He denied any hematochezia or melena.  He reports he is taking Casodex without complications including nausea, fatigue or hot flashes.  He does not report any headaches or blurry vision.does not report any fevers, chills, sweats or weight loss. Does that report any chest pain, palpitation orthopnea. Does not report any change in his bowel habits or nausea or vomiting. Did not report any neurological symptoms. Does not report any frequency urgency or hesitancy. Rest of his review of systems unremarkable.  Medications: I have reviewed the patient's current medications. Current Outpatient Prescriptions  Medication Sig Dispense Refill  . Ascorbic Acid (VITAMIN C) 1000 MG tablet Take 1,000 mg by mouth daily.    Marland Kitchen b complex vitamins tablet  Take 1 tablet by mouth daily.    . bicalutamide (CASODEX) 50 MG tablet Take 1 tablet (50 mg total) by mouth daily. 90 tablet 0  . Calcium Carbonate-Vitamin D (CALCIUM + D PO) Take 1 tablet by mouth daily.    Marland Kitchen leuprolide (LUPRON) 22.5 MG injection Inject 22.5 mg into the muscle every 3 (three) months.    . Multiple Vitamins-Minerals (MULTIVITAMIN PO) Take 1 tablet by mouth daily.    . Omega-3 Fatty Acids (FISH OIL) 1000 MG CAPS Take 1,000 mg by mouth daily.    Marland Kitchen VITAMIN D, ERGOCALCIFEROL, PO Take 1 tablet by mouth daily.     No current facility-administered medications for this visit.    Allergies: No Known Allergies  Past Medical History, Surgical history, Social history, and Family History were reviewed and updated.   Physical Exam: Blood pressure 116/78, pulse 58, temperature 97.7 F (36.5 C), temperature source Oral, resp. rate 20, height 5\' 8"  (1.727 m), weight 168 lb 9.6 oz (76.476 kg), SpO2 100 %. ECOG: 0 General appearance: Alert, awake gentleman without distress. Head: Normocephalic, without obvious abnormality no oral thrush noted. Neck: no adenopathy Lymph nodes: Cervical, supraclavicular, and axillary nodes normal. Heart:regular rate and rhythm, S1, S2 normal, no murmur, click, rub or gallop Lung:chest clear, no wheezing, rales, normal symmetric air entry Abdomin: soft, non-tender, without masses or organomegaly . No shifting dullness or ascites. No splenomegaly. Perirectal examination showed no abscesses or masses. EXT:no erythema, induration, or nodules   Lab Results: Lab Results  Component Value Date   WBC 5.8  02/17/2016   HGB 15.4 02/17/2016   HCT 43.6 02/17/2016   MCV 86.3 02/17/2016   PLT 198 02/17/2016     Chemistry      Component Value Date/Time   NA 139 02/17/2016 1134   NA 139 03/15/2012 0911   NA 144 09/15/2011 0953   K 4.9 02/17/2016 1134   K 4.7 03/15/2012 0911   K 4.6 09/15/2011 0953   CL 106 01/03/2013 0916   CL 106 03/15/2012 0911   CL  103 09/15/2011 0953   CO2 26 02/17/2016 1134   CO2 26 03/15/2012 0911   CO2 28 09/15/2011 0953   BUN 18.7 02/17/2016 1134   BUN 15 03/15/2012 0911   BUN 15 09/15/2011 0953   CREATININE 1.0 02/17/2016 1134   CREATININE 0.92 03/15/2012 0911   CREATININE 0.9 09/15/2011 0953      Component Value Date/Time   CALCIUM 10.0 02/17/2016 1134   CALCIUM 9.5 03/15/2012 0911   CALCIUM 9.3 09/15/2011 0953   ALKPHOS 101 02/17/2016 1134   ALKPHOS 84 03/15/2012 0911   ALKPHOS 93* 09/15/2011 0953   AST 32 02/17/2016 1134   AST 107* 03/15/2012 0911   AST 60* 09/15/2011 0953   ALT 36 02/17/2016 1134   ALT 46 03/15/2012 0911   ALT 82* 09/15/2011 0953   BILITOT 1.37* 02/17/2016 1134   BILITOT 1.4* 03/15/2012 0911   BILITOT 1.80* 09/15/2011 0953      Results for David Huffman (MRN [id number]) as of 02/20/2016 10:39  Ref. Range 11/05/2015 09:52 11/05/2015 09:52 02/17/2016 11:34  PSA Latest Ref Range: 0.0-4.0 ng/mL 3.95 3.7 3.0    ASSESSMENT AND PLAN:    This is a pleasant 55 year old gentleman with the following issues:  1. 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 Casodex was added to his regimen in 05/2011. He tolerated well without any complications. His PSA continues to be under excellent control and recently down to 3.0 he has no signs or symptoms to suggest recurrent disease and for the time being we will continue the current treatment approach. Second line hormonal therapy will be used he develops rise in his PSA. 2. Androgen deprivation.  Will continue Lupron today and every 3 months.  3. Bone health.  Continue calcium and vitamin D supplementation.   4. Screening colonoscopy: this was completed in 2015.  5. Gluteal muscle pain: His examination did not show any perirectal abscess and likely musculoskeletal in nature. 6. Followup will be in 3 months' time.   Altus Lumberton LP, MD 6/29/201710:49 AM

## 2016-02-21 ENCOUNTER — Ambulatory Visit: Payer: BLUE CROSS/BLUE SHIELD | Admitting: Oncology

## 2016-02-21 ENCOUNTER — Ambulatory Visit: Payer: BLUE CROSS/BLUE SHIELD

## 2016-03-31 ENCOUNTER — Other Ambulatory Visit: Payer: Self-pay | Admitting: *Deleted

## 2016-03-31 DIAGNOSIS — C61 Malignant neoplasm of prostate: Secondary | ICD-10-CM

## 2016-03-31 MED ORDER — BICALUTAMIDE 50 MG PO TABS: 50.0000 mg | ORAL_TABLET | Freq: Every day | ORAL | 0 refills | Status: DC

## 2016-03-31 NOTE — Telephone Encounter (Signed)
Pt called with request for Casodex refill. Refill sent to [facility] per pt request

## 2016-05-20 ENCOUNTER — Other Ambulatory Visit (HOSPITAL_BASED_OUTPATIENT_CLINIC_OR_DEPARTMENT_OTHER): Payer: BLUE CROSS/BLUE SHIELD

## 2016-05-20 DIAGNOSIS — C61 Malignant neoplasm of prostate: Secondary | ICD-10-CM

## 2016-05-20 LAB — CBC WITH DIFFERENTIAL/PLATELET
BASO%: 0.9 % (ref 0.0–2.0)
Basophils Absolute: 0 10*3/uL (ref 0.0–0.1)
EOS%: 5.5 % (ref 0.0–7.0)
Eosinophils Absolute: 0.3 10*3/uL (ref 0.0–0.5)
HCT: 43.9 % (ref 38.4–49.9)
HGB: 14.7 g/dL (ref 13.0–17.1)
LYMPH#: 2.1 10*3/uL (ref 0.9–3.3)
LYMPH%: 40.1 % (ref 14.0–49.0)
MCH: 29.8 pg (ref 27.2–33.4)
MCHC: 33.5 g/dL (ref 32.0–36.0)
MCV: 88.7 fL (ref 79.3–98.0)
MONO#: 0.4 10*3/uL (ref 0.1–0.9)
MONO%: 8.1 % (ref 0.0–14.0)
NEUT#: 2.4 10*3/uL (ref 1.5–6.5)
NEUT%: 45.4 % (ref 39.0–75.0)
Platelets: 218 10*3/uL (ref 140–400)
RBC: 4.94 10*6/uL (ref 4.20–5.82)
RDW: 12.8 % (ref 11.0–14.6)
WBC: 5.2 10*3/uL (ref 4.0–10.3)

## 2016-05-20 LAB — COMPREHENSIVE METABOLIC PANEL
ALT: 44 U/L (ref 0–55)
AST: 58 U/L — ABNORMAL HIGH (ref 5–34)
Albumin: 3.9 g/dL (ref 3.5–5.0)
Alkaline Phosphatase: 102 U/L (ref 40–150)
Anion Gap: 9 mEq/L (ref 3–11)
BUN: 16.5 mg/dL (ref 7.0–26.0)
CO2: 26 meq/L (ref 22–29)
Calcium: 9.9 mg/dL (ref 8.4–10.4)
Chloride: 107 mEq/L (ref 98–109)
Creatinine: 0.9 mg/dL (ref 0.7–1.3)
GLUCOSE: 66 mg/dL — AB (ref 70–140)
POTASSIUM: 4.8 meq/L (ref 3.5–5.1)
SODIUM: 142 meq/L (ref 136–145)
Total Bilirubin: 1.35 mg/dL — ABNORMAL HIGH (ref 0.20–1.20)
Total Protein: 7.3 g/dL (ref 6.4–8.3)

## 2016-05-21 LAB — PSA: PROSTATE SPECIFIC AG, SERUM: 4.3 ng/mL — AB (ref 0.0–4.0)

## 2016-05-22 ENCOUNTER — Ambulatory Visit (HOSPITAL_BASED_OUTPATIENT_CLINIC_OR_DEPARTMENT_OTHER): Payer: BLUE CROSS/BLUE SHIELD | Admitting: Oncology

## 2016-05-22 ENCOUNTER — Ambulatory Visit: Payer: BLUE CROSS/BLUE SHIELD

## 2016-05-22 ENCOUNTER — Telehealth: Payer: Self-pay | Admitting: Oncology

## 2016-05-22 VITALS — BP 119/71 | HR 64 | Temp 97.4°F | Resp 18 | Ht 68.0 in | Wt 167.9 lb

## 2016-05-22 DIAGNOSIS — C61 Malignant neoplasm of prostate: Secondary | ICD-10-CM

## 2016-05-22 DIAGNOSIS — M791 Myalgia: Secondary | ICD-10-CM

## 2016-05-22 DIAGNOSIS — E291 Testicular hypofunction: Secondary | ICD-10-CM | POA: Diagnosis not present

## 2016-05-22 MED ORDER — LEUPROLIDE ACETATE (4 MONTH) 30 MG IM KIT
22.5000 mg | PACK | Freq: Once | INTRAMUSCULAR | Status: DC
  Filled 2016-05-22: qty 30

## 2016-05-22 NOTE — Progress Notes (Signed)
Hematology and Oncology Follow Up Visit  David Huffman [id number] 1960-11-04 55 y.o. 05/22/2016 9:24 AM  CC: David Messick, MD    Principle Diagnosis: A 55 year old gentleman with prostate cancer diagnosed in March 2008, Gleason score 7, PSA of 31.  Prior Therapy: 1. Status post prostatectomy done in 2008, pathology revealing T2c N0 with 0 out of 12 lymph nodes involved, pathology revealed 4 + 3 equals 7 Gleason score.  2. The patient continued to have rise in his PSA up to 52 without any measurable disease.  Current therapy:  He is on hormonal deprivation, Lupron 22.5 mg every 3 months on a continuous basis started in 2008.  PSA nadir down to 0.4 and then rose up to 2.42 in 05/2011. Casodex 50 mg daily added in 05/2011. PSA remains controlled at this time.  Interim History:  David Huffman presents today for a followup visit with his wife. Since the last visit, he reports No major changes in his health. He continues to be in excellent health and shape and performs activities of daily living. He continues to work full time which involves carrying heavy boxes on a regular basis. Despite that, he continues to run and cycle frequently.   He does report some occasional hip pain and groin pain but mostly related to his physical activity. He is able to sleep well and appetite has been excellent.  He does not report any headaches or blurry vision.does not report any fevers, chills, sweats or weight loss. Does that report any chest pain, palpitation orthopnea. Does not report any change in his bowel habits or nausea or vomiting. Did not report any neurological symptoms. Does not report any frequency urgency or hesitancy. Rest of his review of systems unremarkable.  Medications: I have reviewed the patient's current medications. Current Outpatient Prescriptions  Medication Sig Dispense Refill  . Ascorbic Acid (VITAMIN C) 1000 MG tablet Take 1,000 mg by mouth daily.    Marland Kitchen b complex vitamins tablet Take 1  tablet by mouth daily.    . bicalutamide (CASODEX) 50 MG tablet Take 1 tablet (50 mg total) by mouth daily. 90 tablet 0  . Calcium Carbonate-Vitamin D (CALCIUM + D PO) Take 1 tablet by mouth daily.    Marland Kitchen leuprolide (LUPRON) 22.5 MG injection Inject 22.5 mg into the muscle every 3 (three) months.    . Multiple Vitamins-Minerals (MULTIVITAMIN PO) Take 1 tablet by mouth daily.    . Omega-3 Fatty Acids (FISH OIL) 1000 MG CAPS Take 1,000 mg by mouth daily.    Marland Kitchen VITAMIN D, ERGOCALCIFEROL, PO Take 1 tablet by mouth daily.     No current facility-administered medications for this visit.     Allergies: No Known Allergies  Past Medical History, Surgical history, Social history, and Family History were reviewed and updated.   Physical Exam: Blood pressure 119/71, pulse 64, temperature 97.4 F (36.3 C), temperature source Oral, resp. rate 18, height 5\' 8"  (1.727 m), weight 167 lb 14.4 oz (76.2 kg), SpO2 100 %. ECOG: 0 General appearance: Well-appearing gentleman without distress. Head: Normocephalic, without obvious abnormality no oral  Neck: no adenopathy Lymph nodes: Cervical, supraclavicular, and axillary nodes normal. Heart:regular rate and rhythm, S1, S2 normal, no murmur, click, rub or gallop Lung:chest clear, no wheezing, rales, normal symmetric air entry Abdomin: soft, non-tender, without masses or organomegaly . No rebound or guarding. Perirectal examination showed no abscesses or masses. EXT:no erythema, induration, or nodules   Lab Results: Lab Results  Component Value Date   WBC  5.2 05/20/2016   HGB 14.7 05/20/2016   HCT 43.9 05/20/2016   MCV 88.7 05/20/2016   PLT 218 05/20/2016     Chemistry      Component Value Date/Time   NA 142 05/20/2016 0907   K 4.8 05/20/2016 0907   CL 106 01/03/2013 0916   CO2 26 05/20/2016 0907   BUN 16.5 05/20/2016 0907   CREATININE 0.9 05/20/2016 0907      Component Value Date/Time   CALCIUM 9.9 05/20/2016 0907   ALKPHOS 102 05/20/2016  0907   AST 58 (H) 05/20/2016 0907   ALT 44 05/20/2016 0907   BILITOT 1.35 (H) 05/20/2016 0907      Results for David Huffman, David Huffman (MRN [id number]) as of 05/22/2016 08:09  Ref. Range 11/05/2015 09:52 11/05/2015 09:52 02/17/2016 11:34 05/20/2016 09:07  PSA Latest Ref Range: 0.0 - 4.0 ng/mL 3.95 3.7 3.0 4.3 (H)     ASSESSMENT AND PLAN:    This is a pleasant 55 year old gentleman with the following issues:  1. 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 Casodex was added to his regimen in 05/2011. He continues to tolerate this combination without major complications. His PSA has been rather on a slow rise with a doubling time of 18 months. The plan is to continue on the same treatment time being and he is different salvage therapy he develops rapid progression of his PSA or measurable disease. He will have a repeat imaging studies in March 2018 or sooner if needed to. We have discussed different salvage regimen available for castration resistant disease which includes David Huffman, Xtandi among others. 2. Androgen deprivation.  Will continue Lupron today and every 3 months. No complications noted at this time. 3. Bone health.  Continue calcium and vitamin D supplementation.   4. Screening colonoscopy: this was completed in 2015.  5. Diffuse muscle pain: His examination did not reveal any focal abnormalities. Since likely related to his exercise and activity. He will have repeat imaging studies to rule out malignancy in the next 6 months sooner if needed to. 6. Followup will be in 3 months' time.   [facility], MD 9/29/20179:24 AM

## 2016-05-22 NOTE — Patient Instructions (Signed)
Leuprolide depot injection What is this medicine? LEUPROLIDE (loo PROE lide) is a man-made protein that acts like a natural hormone in the body. It decreases testosterone in men and decreases estrogen in women. In men, this medicine is used to treat advanced prostate cancer. In women, some forms of this medicine may be used to treat endometriosis, uterine fibroids, or other male hormone-related problems. This medicine may be used for other purposes; ask your health care provider or pharmacist if you have questions. What should I tell my health care provider before I take this medicine? They need to know if you have any of these conditions: -diabetes -heart disease or previous heart attack -high blood pressure -high cholesterol -osteoporosis -pain or difficulty passing urine -spinal cord metastasis -stroke -tobacco smoker -unusual vaginal bleeding (women) -an unusual or allergic reaction to leuprolide, benzyl alcohol, other medicines, foods, dyes, or preservatives -pregnant or trying to get pregnant -breast-feeding How should I use this medicine? This medicine is for injection into a muscle or for injection under the skin. It is given by a health care professional in a hospital or clinic setting. The specific product will determine how it will be given to you. Make sure you understand which product you receive and how often you will receive it. Talk to your pediatrician regarding the use of this medicine in children. Special care may be needed. Overdosage: If you think you have taken too much of this medicine contact a poison control center or emergency room at once. NOTE: This medicine is only for you. Do not share this medicine with others. What if I miss a dose? It is important not to miss a dose. Call your doctor or health care professional if you are unable to keep an appointment. Depot injections: Depot injections are given either once-monthly, every 12 weeks, every 16 weeks, or  every 24 weeks depending on the product you are prescribed. The product you are prescribed will be based on if you are male or male, and your condition. Make sure you understand your product and dosing. What may interact with this medicine? Do not take this medicine with any of the following medications: -chasteberry This medicine may also interact with the following medications: -herbal or dietary supplements, like black cohosh or DHEA -male hormones, like estrogens or progestins and birth control pills, patches, rings, or injections -male hormones, like testosterone This list may not describe all possible interactions. Give your health care provider a list of all the medicines, herbs, non-prescription drugs, or dietary supplements you use. Also tell them if you smoke, drink alcohol, or use illegal drugs. Some items may interact with your medicine. What should I watch for while using this medicine? Visit your doctor or health care professional for regular checks on your progress. During the first weeks of treatment, your symptoms may get worse, but then will improve as you continue your treatment. You may get hot flashes, increased bone pain, increased difficulty passing urine, or an aggravation of nerve symptoms. Discuss these effects with your doctor or health care professional, some of them may improve with continued use of this medicine. Male patients may experience a menstrual cycle or spotting during the first months of therapy with this medicine. If this continues, contact your doctor or health care professional. What side effects may I notice from receiving this medicine? Side effects that you should report to your doctor or health care professional as soon as possible: -allergic reactions like skin rash, itching or hives, swelling of the   face, lips, or tongue -breathing problems -chest pain -depression or memory disorders -pain in your legs or groin -pain at site where injected or  implanted -severe headache -swelling of the feet and legs -visual changes -vomiting Side effects that usually do not require medical attention (report to your doctor or health care professional if they continue or are bothersome): -breast swelling or tenderness -decrease in sex drive or performance -diarrhea -hot flashes -loss of appetite -muscle, joint, or bone pains -nausea -redness or irritation at site where injected or implanted -skin problems or acne This list may not describe all possible side effects. Call your doctor for medical advice about side effects. You may report side effects to FDA at 1-800-FDA-1088. Where should I keep my medicine? This drug is given in a hospital or clinic and will not be stored at home. NOTE: This sheet is a summary. It may not cover all possible information. If you have questions about this medicine, talk to your doctor, pharmacist, or health care provider.    2016, Elsevier/Gold Standard. (2014-05-04 14:16:23)  

## 2016-05-22 NOTE — Telephone Encounter (Signed)
Avs report and appointment schedule given to patient, per 05/22/16 los. °

## 2016-07-02 ENCOUNTER — Telehealth: Payer: Self-pay | Admitting: *Deleted

## 2016-07-02 NOTE — Telephone Encounter (Signed)
Please refill.

## 2016-07-02 NOTE — Telephone Encounter (Addendum)
Received call @ 940 in regards to patient needing a refill on his CASODEX 50mg . Pt. asked if he could receive a call to make him aware it was ready for pickup.

## 2016-07-07 ENCOUNTER — Other Ambulatory Visit: Payer: Self-pay | Admitting: *Deleted

## 2016-07-07 DIAGNOSIS — C61 Malignant neoplasm of prostate: Secondary | ICD-10-CM

## 2016-07-07 MED ORDER — BICALUTAMIDE 50 MG PO TABS: 50.0000 mg | ORAL_TABLET | Freq: Every day | ORAL | 0 refills | Status: DC

## 2016-08-19 ENCOUNTER — Other Ambulatory Visit (HOSPITAL_BASED_OUTPATIENT_CLINIC_OR_DEPARTMENT_OTHER): Payer: BLUE CROSS/BLUE SHIELD

## 2016-08-19 DIAGNOSIS — C61 Malignant neoplasm of prostate: Secondary | ICD-10-CM | POA: Diagnosis not present

## 2016-08-19 LAB — CBC WITH DIFFERENTIAL/PLATELET
BASO%: 0.8 % (ref 0.0–2.0)
Basophils Absolute: 0 10*3/uL (ref 0.0–0.1)
EOS%: 3.6 % (ref 0.0–7.0)
Eosinophils Absolute: 0.2 10*3/uL (ref 0.0–0.5)
HCT: 44.6 % (ref 38.4–49.9)
HGB: 15.2 g/dL (ref 13.0–17.1)
LYMPH%: 37.6 % (ref 14.0–49.0)
MCH: 30.1 pg (ref 27.2–33.4)
MCHC: 34.1 g/dL (ref 32.0–36.0)
MCV: 88.4 fL (ref 79.3–98.0)
MONO#: 0.4 10*3/uL (ref 0.1–0.9)
MONO%: 8.9 % (ref 0.0–14.0)
NEUT%: 49.1 % (ref 39.0–75.0)
NEUTROS ABS: 2.4 10*3/uL (ref 1.5–6.5)
PLATELETS: 238 10*3/uL (ref 140–400)
RBC: 5.05 10*6/uL (ref 4.20–5.82)
RDW: 12.5 % (ref 11.0–14.6)
WBC: 4.9 10*3/uL (ref 4.0–10.3)
lymph#: 1.8 10*3/uL (ref 0.9–3.3)

## 2016-08-19 LAB — COMPREHENSIVE METABOLIC PANEL
ALT: 47 U/L (ref 0–55)
ANION GAP: 9 meq/L (ref 3–11)
AST: 39 U/L — ABNORMAL HIGH (ref 5–34)
Albumin: 4 g/dL (ref 3.5–5.0)
Alkaline Phosphatase: 140 U/L (ref 40–150)
BUN: 17 mg/dL (ref 7.0–26.0)
CHLORIDE: 107 meq/L (ref 98–109)
CO2: 26 meq/L (ref 22–29)
CREATININE: 0.9 mg/dL (ref 0.7–1.3)
Calcium: 10 mg/dL (ref 8.4–10.4)
GLUCOSE: 64 mg/dL — AB (ref 70–140)
Potassium: 5 mEq/L (ref 3.5–5.1)
SODIUM: 141 meq/L (ref 136–145)
Total Bilirubin: 1.02 mg/dL (ref 0.20–1.20)
Total Protein: 7.7 g/dL (ref 6.4–8.3)

## 2016-08-20 LAB — PSA: Prostate Specific Ag, Serum: 4.3 ng/mL — ABNORMAL HIGH (ref 0.0–4.0)

## 2016-08-20 LAB — TESTOSTERONE: Testosterone, Serum: 8 ng/dL — ABNORMAL LOW (ref 264–916)

## 2016-08-21 ENCOUNTER — Ambulatory Visit (HOSPITAL_BASED_OUTPATIENT_CLINIC_OR_DEPARTMENT_OTHER): Payer: BLUE CROSS/BLUE SHIELD | Admitting: Oncology

## 2016-08-21 ENCOUNTER — Ambulatory Visit (HOSPITAL_BASED_OUTPATIENT_CLINIC_OR_DEPARTMENT_OTHER): Payer: BLUE CROSS/BLUE SHIELD

## 2016-08-21 ENCOUNTER — Telehealth: Payer: Self-pay | Admitting: Oncology

## 2016-08-21 VITALS — BP 123/77 | HR 66 | Resp 20 | Ht 68.0 in | Wt 170.0 lb

## 2016-08-21 DIAGNOSIS — C61 Malignant neoplasm of prostate: Secondary | ICD-10-CM

## 2016-08-21 DIAGNOSIS — Z5111 Encounter for antineoplastic chemotherapy: Secondary | ICD-10-CM

## 2016-08-21 DIAGNOSIS — M791 Myalgia: Secondary | ICD-10-CM | POA: Diagnosis not present

## 2016-08-21 DIAGNOSIS — E291 Testicular hypofunction: Secondary | ICD-10-CM

## 2016-08-21 MED ORDER — LEUPROLIDE ACETATE (4 MONTH) 30 MG IM KIT
22.5000 mg | PACK | Freq: Once | INTRAMUSCULAR | Status: AC
  Administered 2016-08-21: 22.5 mg via INTRAMUSCULAR
  Filled 2016-08-21: qty 30

## 2016-08-21 NOTE — Telephone Encounter (Signed)
Appointments scheduled per 12/29 LOS. Patient given AVS report and calendars with future scheduled appointments. °

## 2016-08-21 NOTE — Patient Instructions (Signed)
Leuprolide depot injection What is this medicine? LEUPROLIDE (loo PROE lide) is a man-made protein that acts like a natural hormone in the body. It decreases testosterone in men and decreases estrogen in women. In men, this medicine is used to treat advanced prostate cancer. In women, some forms of this medicine may be used to treat endometriosis, uterine fibroids, or other male hormone-related problems. This medicine may be used for other purposes; ask your health care provider or pharmacist if you have questions. What should I tell my health care provider before I take this medicine? They need to know if you have any of these conditions: -diabetes -heart disease or previous heart attack -high blood pressure -high cholesterol -osteoporosis -pain or difficulty passing urine -spinal cord metastasis -stroke -tobacco smoker -unusual vaginal bleeding (women) -an unusual or allergic reaction to leuprolide, benzyl alcohol, other medicines, foods, dyes, or preservatives -pregnant or trying to get pregnant -breast-feeding How should I use this medicine? This medicine is for injection into a muscle or for injection under the skin. It is given by a health care professional in a hospital or clinic setting. The specific product will determine how it will be given to you. Make sure you understand which product you receive and how often you will receive it. Talk to your pediatrician regarding the use of this medicine in children. Special care may be needed. Overdosage: If you think you have taken too much of this medicine contact a poison control center or emergency room at once. NOTE: This medicine is only for you. Do not share this medicine with others. What if I miss a dose? It is important not to miss a dose. Call your doctor or health care professional if you are unable to keep an appointment. Depot injections: Depot injections are given either once-monthly, every 12 weeks, every 16 weeks, or  every 24 weeks depending on the product you are prescribed. The product you are prescribed will be based on if you are male or male, and your condition. Make sure you understand your product and dosing. What may interact with this medicine? Do not take this medicine with any of the following medications: -chasteberry This medicine may also interact with the following medications: -herbal or dietary supplements, like black cohosh or DHEA -male hormones, like estrogens or progestins and birth control pills, patches, rings, or injections -male hormones, like testosterone This list may not describe all possible interactions. Give your health care provider a list of all the medicines, herbs, non-prescription drugs, or dietary supplements you use. Also tell them if you smoke, drink alcohol, or use illegal drugs. Some items may interact with your medicine. What should I watch for while using this medicine? Visit your doctor or health care professional for regular checks on your progress. During the first weeks of treatment, your symptoms may get worse, but then will improve as you continue your treatment. You may get hot flashes, increased bone pain, increased difficulty passing urine, or an aggravation of nerve symptoms. Discuss these effects with your doctor or health care professional, some of them may improve with continued use of this medicine. Male patients may experience a menstrual cycle or spotting during the first months of therapy with this medicine. If this continues, contact your doctor or health care professional. What side effects may I notice from receiving this medicine? Side effects that you should report to your doctor or health care professional as soon as possible: -allergic reactions like skin rash, itching or hives, swelling of the   face, lips, or tongue -breathing problems -chest pain -depression or memory disorders -pain in your legs or groin -pain at site where injected or  implanted -severe headache -swelling of the feet and legs -visual changes -vomiting Side effects that usually do not require medical attention (report to your doctor or health care professional if they continue or are bothersome): -breast swelling or tenderness -decrease in sex drive or performance -diarrhea -hot flashes -loss of appetite -muscle, joint, or bone pains -nausea -redness or irritation at site where injected or implanted -skin problems or acne This list may not describe all possible side effects. Call your doctor for medical advice about side effects. You may report side effects to FDA at 1-800-FDA-1088. Where should I keep my medicine? This drug is given in a hospital or clinic and will not be stored at home. NOTE: This sheet is a summary. It may not cover all possible information. If you have questions about this medicine, talk to your doctor, pharmacist, or health care provider.    2016, Elsevier/Gold Standard. (2014-05-04 14:16:23)  

## 2016-08-21 NOTE — Progress Notes (Signed)
Hematology and Oncology Follow Up Visit  David Huffman [id number] 03/13/1961 55 y.o. 08/21/2016 9:58 AM  CC: [name], MD    Principle Diagnosis: 55 year old gentleman with prostate cancer diagnosed in March 2008, Gleason score 7, PSA of 31.  Prior Therapy: 1. Status post prostatectomy done in 2008, pathology revealing T2c N0 with 0 out of 12 lymph nodes involved, pathology revealed 4 + 3 equals 7 Gleason score.  2. The patient continued to have rise in his PSA up to 52 without any measurable disease.  Current therapy:  He is on hormonal deprivation, Lupron 22.5 mg every 3 months on a continuous basis started in 2008.  PSA nadir down to 0.4 and then rose up to 2.42 in 05/2011. Casodex 50 mg daily added in 05/2011.   Interim History:  Mr. Shinder presents today for a followup visit with his wife. Since the last visit, he continues to be in excellent health and shape. He continues to work full time at [employer] and had not reported any major changes and inability to do so. He does report periodic back pain related to lifting boxes which is not changed. He denied any neurological symptoms.  He continues to be on Casodex without any complications. He denied any worsening hot flashes or GI symptoms. He denied any hematuria or dysuria. His quality of life remains excellent.  He does not report any headaches or blurry vision.does not report any fevers, chills, sweats or weight loss. Does that report any chest pain, palpitation orthopnea. Does not report any change in his bowel habits or nausea or vomiting. Did not report any neurological symptoms. Does not report any frequency urgency or hesitancy. Rest of his review of systems unremarkable.  Medications: I have reviewed the patient's current medications. Current Outpatient Prescriptions  Medication Sig Dispense Refill  . Ascorbic Acid (VITAMIN C) 1000 MG tablet Take 1,000 mg by mouth daily.    Marland Kitchen b complex vitamins tablet Take 1 tablet by mouth daily.     . bicalutamide (CASODEX) 50 MG tablet Take 1 tablet (50 mg total) by mouth daily. 90 tablet 0  . Calcium Carbonate-Vitamin D (CALCIUM + D PO) Take 1 tablet by mouth daily.    Marland Kitchen leuprolide (LUPRON) 22.5 MG injection Inject 22.5 mg into the muscle every 3 (three) months.    . Multiple Vitamins-Minerals (MULTIVITAMIN PO) Take 1 tablet by mouth daily.    . Omega-3 Fatty Acids (FISH OIL) 1000 MG CAPS Take 1,000 mg by mouth daily.    Marland Kitchen VITAMIN D, ERGOCALCIFEROL, PO Take 1 tablet by mouth daily.     No current facility-administered medications for this visit.    Facility-Administered Medications Ordered in Other Visits  Medication Dose Route Frequency Provider Last Rate Last Dose  . leuprolide (LUPRON) injection 22.5 mg  22.5 mg Intramuscular Once Wyatt Portela, MD        Allergies: No Known Allergies  Past Medical History, Surgical history, Social history, and Family History were reviewed and updated.   Physical Exam: Blood pressure 123/77, pulse 66, resp. rate 20, height 5\' 8"  (1.727 m), weight 170 lb (77.1 kg), SpO2 100 %. ECOG: 0 General appearance: Alert, awake gentleman without distress. Head: Normocephalic, without obvious abnormality no oral  Neck: no adenopathy Lymph nodes: Cervical, supraclavicular, and axillary nodes normal. Heart:regular rate and rhythm, S1, S2 normal, no murmur, click, rub or gallop Lung:chest clear, no wheezing, rales, normal symmetric air entry Abdomin: soft, non-tender, without masses or organomegaly. No shifting dullness or ascites.  Perirectal examination showed no abscesses or masses. EXT:no erythema, induration, or nodules   Lab Results: Lab Results  Component Value Date   WBC 4.9 08/19/2016   HGB 15.2 08/19/2016   HCT 44.6 08/19/2016   MCV 88.4 08/19/2016   PLT 238 08/19/2016     Chemistry      Component Value Date/Time   NA 141 08/19/2016 0900   K 5.0 08/19/2016 0900   CL 106 01/03/2013 0916   CO2 26 08/19/2016 0900   BUN 17.0  08/19/2016 0900   CREATININE 0.9 08/19/2016 0900      Component Value Date/Time   CALCIUM 10.0 08/19/2016 0900   ALKPHOS 140 08/19/2016 0900   AST 39 (H) 08/19/2016 0900   ALT 47 08/19/2016 0900   BILITOT 1.02 08/19/2016 0900      Results for KENSEN, TIERRABLANCA (MRN [id number]) as of 08/21/2016 09:43  Ref. Range 05/20/2016 09:07 08/19/2016 09:00  PSA Latest Ref Range: 0.0 - 4.0 ng/mL 4.3 (H) 4.3 (H)     ASSESSMENT AND PLAN:    This is a pleasant 55 year old gentleman with the following issues:  1. 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 Casodex was added to his regimen in 05/2011. He continues to tolerate this combination without major complications. His PSA Unchanged in the last 3 months and remained reasonably controlled over the last year. The plan is to continue the same dose and schedule and use of his salvage therapy who develops rapid progression of his prostate cancer. He will have staging studies including CT scan and bone scan done in March 2018. 2. Androgen deprivation.  Will continue Lupron today and every 3 months. No complications noted at this time. 3. Bone health.  Continue calcium and vitamin D supplementation.   4. Screening colonoscopy: this was completed in 2015.  5. Diffuse muscle pain: No major changes since the last exam. Repeat imaging studies will determine of there is any malignancy. 6. Followup will be in 3 months' time.   [id number], MD 12/29/20179:58 AM

## 2016-10-06 ENCOUNTER — Encounter: Payer: Self-pay | Admitting: *Deleted

## 2016-10-06 ENCOUNTER — Other Ambulatory Visit: Payer: Self-pay | Admitting: *Deleted

## 2016-10-06 DIAGNOSIS — C61 Malignant neoplasm of prostate: Secondary | ICD-10-CM

## 2016-10-06 MED ORDER — BICALUTAMIDE 50 MG PO TABS: 50.0000 mg | ORAL_TABLET | Freq: Every day | ORAL | 0 refills | Status: DC

## 2016-11-18 ENCOUNTER — Ambulatory Visit (HOSPITAL_COMMUNITY)
Admission: RE | Admit: 2016-11-18 | Discharge: 2016-11-18 | Disposition: A | Discharge status: Home / Self Care | Payer: BLUE CROSS/BLUE SHIELD | Source: Ambulatory Visit | Attending: Oncology | Admitting: Oncology

## 2016-11-18 ENCOUNTER — Other Ambulatory Visit (HOSPITAL_BASED_OUTPATIENT_CLINIC_OR_DEPARTMENT_OTHER): Payer: BLUE CROSS/BLUE SHIELD

## 2016-11-18 ENCOUNTER — Encounter (HOSPITAL_COMMUNITY)
Admission: RE | Admit: 2016-11-18 | Discharge: 2016-11-18 | Disposition: A | Discharge status: Home / Self Care | Payer: BLUE CROSS/BLUE SHIELD | Source: Ambulatory Visit | Attending: Oncology | Admitting: Oncology

## 2016-11-18 ENCOUNTER — Encounter (HOSPITAL_COMMUNITY): Payer: Self-pay

## 2016-11-18 DIAGNOSIS — C61 Malignant neoplasm of prostate: Secondary | ICD-10-CM | POA: Insufficient documentation

## 2016-11-18 DIAGNOSIS — R937 Abnormal findings on diagnostic imaging of other parts of musculoskeletal system: Secondary | ICD-10-CM | POA: Diagnosis not present

## 2016-11-18 LAB — CBC WITH DIFFERENTIAL/PLATELET
BASO%: 0.8 % (ref 0.0–2.0)
BASOS ABS: 0 10*3/uL (ref 0.0–0.1)
EOS%: 3.3 % (ref 0.0–7.0)
Eosinophils Absolute: 0.2 10*3/uL (ref 0.0–0.5)
HCT: 43.6 % (ref 38.4–49.9)
HEMOGLOBIN: 15.2 g/dL (ref 13.0–17.1)
LYMPH#: 2 10*3/uL (ref 0.9–3.3)
LYMPH%: 40.5 % (ref 14.0–49.0)
MCH: 30.4 pg (ref 27.2–33.4)
MCHC: 34.8 g/dL (ref 32.0–36.0)
MCV: 87.3 fL (ref 79.3–98.0)
MONO#: 0.4 10*3/uL (ref 0.1–0.9)
MONO%: 8.4 % (ref 0.0–14.0)
NEUT%: 47 % (ref 39.0–75.0)
NEUTROS ABS: 2.4 10*3/uL (ref 1.5–6.5)
Platelets: 222 10*3/uL (ref 140–400)
RBC: 5 10*6/uL (ref 4.20–5.82)
RDW: 13.3 % (ref 11.0–14.6)
WBC: 5 10*3/uL (ref 4.0–10.3)

## 2016-11-18 LAB — COMPREHENSIVE METABOLIC PANEL
ALBUMIN: 4.3 g/dL (ref 3.5–5.0)
ALK PHOS: 140 U/L (ref 40–150)
ALT: 122 U/L — AB (ref 0–55)
AST: 79 U/L — AB (ref 5–34)
Anion Gap: 10 mEq/L (ref 3–11)
BILIRUBIN TOTAL: 1.27 mg/dL — AB (ref 0.20–1.20)
BUN: 19.2 mg/dL (ref 7.0–26.0)
CO2: 25 meq/L (ref 22–29)
CREATININE: 1 mg/dL (ref 0.7–1.3)
Calcium: 10.1 mg/dL (ref 8.4–10.4)
Chloride: 107 mEq/L (ref 98–109)
EGFR: 87 mL/min/{1.73_m2} — ABNORMAL LOW (ref >90)
GLUCOSE: 103 mg/dL (ref 70–140)
Potassium: 4.8 mEq/L (ref 3.5–5.1)
SODIUM: 141 meq/L (ref 136–145)
TOTAL PROTEIN: 7.8 g/dL (ref 6.4–8.3)

## 2016-11-18 MED ORDER — IOPAMIDOL (ISOVUE-300) INJECTION 61%
100.0000 mL | Freq: Once | INTRAVENOUS | Status: AC | PRN
  Administered 2016-11-18: 100 mL via INTRAVENOUS

## 2016-11-18 MED ORDER — TECHNETIUM TC 99M MEDRONATE IV KIT
25.0000 | PACK | Freq: Once | INTRAVENOUS | Status: AC | PRN
  Administered 2016-11-18: 21.2 via INTRAVENOUS

## 2016-11-18 MED ORDER — IOPAMIDOL (ISOVUE-300) INJECTION 61%
INTRAVENOUS | Status: AC
  Filled 2016-11-18: qty 100

## 2016-11-19 LAB — PSA: Prostate Specific Ag, Serum: 4.7 ng/mL — ABNORMAL HIGH (ref 0.0–4.0)

## 2016-11-20 ENCOUNTER — Ambulatory Visit (HOSPITAL_BASED_OUTPATIENT_CLINIC_OR_DEPARTMENT_OTHER): Payer: BLUE CROSS/BLUE SHIELD

## 2016-11-20 ENCOUNTER — Telehealth: Payer: Self-pay | Admitting: Oncology

## 2016-11-20 ENCOUNTER — Ambulatory Visit (HOSPITAL_BASED_OUTPATIENT_CLINIC_OR_DEPARTMENT_OTHER): Payer: BLUE CROSS/BLUE SHIELD | Admitting: Oncology

## 2016-11-20 VITALS — BP 132/75 | HR 75 | Temp 97.8°F | Resp 18 | Ht 68.0 in | Wt 170.1 lb

## 2016-11-20 DIAGNOSIS — C61 Malignant neoplasm of prostate: Secondary | ICD-10-CM | POA: Diagnosis not present

## 2016-11-20 DIAGNOSIS — M791 Myalgia: Secondary | ICD-10-CM

## 2016-11-20 DIAGNOSIS — Z5111 Encounter for antineoplastic chemotherapy: Secondary | ICD-10-CM | POA: Diagnosis not present

## 2016-11-20 DIAGNOSIS — E291 Testicular hypofunction: Secondary | ICD-10-CM

## 2016-11-20 MED ORDER — LEUPROLIDE ACETATE (4 MONTH) 30 MG IM KIT
22.5000 mg | PACK | Freq: Once | INTRAMUSCULAR | Status: AC
  Administered 2016-11-20: 22.5 mg via INTRAMUSCULAR
  Filled 2016-11-20: qty 30

## 2016-11-20 NOTE — Progress Notes (Signed)
Hematology and Oncology Follow Up Visit  David Huffman [id number] 02-Oct-1960 56 y.o. 11/20/2016 10:23 AM  CC: David Messick, MD    Principle Diagnosis: 56 year old gentleman with prostate cancer diagnosed in March 2008, Gleason score 7, PSA of 31.  Prior Therapy: 1. Status post prostatectomy done in 2008, pathology revealing T2c N0 with 0 out of 12 lymph nodes involved, pathology revealed 4 + 3 equals 7 Gleason score.  2. The patient continued to have rise in his PSA up to 52 without any measurable disease.  Current therapy:  He is on hormonal deprivation, Lupron 22.5 mg every 3 months on a continuous basis started in 2008.  PSA nadir down to 0.4 and then rose up to 2.42 in 05/2011. Casodex 50 mg daily added in 05/2011.   Interim History:  [name] presents today for a followup visit with his wife. Since the last visit, he reports no major changes in his health. He did experience muscle pain associated with the stretching exercise and his shoulder. He has been taking ibuprofen which helped his pain. He denied any back pain, shoulder pain or hip pain. He continues to exercise regularly and has not experienced any falls or trauma.   He continues to receive Lupron and Casodex without complications. He denied any nausea or abdominal discomfort. He denied any hot flashes or weight changes.  He does not report any headaches or blurry vision.does not report any fevers, chills, sweats or weight loss. Does that report any chest pain, palpitation orthopnea. Does not report any change in his bowel habits or nausea or vomiting. Did not report any neurological symptoms. Does not report any frequency urgency or hesitancy. Rest of his review of systems unremarkable.  Medications: I have reviewed the patient's current medications. Current Outpatient Prescriptions  Medication Sig Dispense Refill  . Ascorbic Acid (VITAMIN C) 1000 MG tablet Take 1,000 mg by mouth daily.    Marland Kitchen b complex vitamins tablet Take 1  tablet by mouth daily.    . bicalutamide (CASODEX) 50 MG tablet Take 1 tablet (50 mg total) by mouth daily. 90 tablet 0  . Calcium Carbonate-Vitamin D (CALCIUM + D PO) Take 1 tablet by mouth daily.    Marland Kitchen leuprolide (LUPRON) 22.5 MG injection Inject 22.5 mg into the muscle every 3 (three) months.    . Multiple Vitamins-Minerals (MULTIVITAMIN PO) Take 1 tablet by mouth daily.    . Omega-3 Fatty Acids (FISH OIL) 1000 MG CAPS Take 1,000 mg by mouth daily.    Marland Kitchen VITAMIN D, ERGOCALCIFEROL, PO Take 1 tablet by mouth daily.     No current facility-administered medications for this visit.     Allergies: No Known Allergies  Past Medical History, Surgical history, Social history, and Family History were reviewed and updated.   Physical Exam: Blood pressure 132/75, pulse 75, temperature 97.8 F (36.6 C), temperature source Oral, resp. rate 18, height 5\' 8"  (1.727 m), weight 170 lb 1.6 oz (77.2 kg), SpO2 100 %. ECOG: 0 General appearance: Well-appearing gentleman appeared without distress. Head: Normocephalic, without obvious abnormality no oral thrush or ulcers. Neck: no adenopathy Lymph nodes: Cervical, supraclavicular, and axillary nodes normal. Heart:regular rate and rhythm, S1, S2 normal, no murmur, click, rub or gallop Lung:chest clear, no wheezing, rales, normal symmetric air entry Abdomin: soft, non-tender, without masses or organomegaly. No rebound or guarding. Perirectal examination showed no abscesses or masses. EXT:no erythema, induration, or nodules   Lab Results: Lab Results  Component Value Date   WBC 5.0 11/18/2016  HGB 15.2 11/18/2016   HCT 43.6 11/18/2016   MCV 87.3 11/18/2016   PLT 222 11/18/2016     Chemistry      Component Value Date/Time   NA 141 11/18/2016 0804   K 4.8 11/18/2016 0804   CL 106 01/03/2013 0916   CO2 25 11/18/2016 0804   BUN 19.2 11/18/2016 0804   CREATININE 1.0 11/18/2016 0804      Component Value Date/Time   CALCIUM 10.1 11/18/2016 0804    ALKPHOS 140 11/18/2016 0804   AST 79 (H) 11/18/2016 0804   ALT 122 (H) 11/18/2016 0804   BILITOT 1.27 (H) 11/18/2016 0804       Results for WATT, GEILER (MRN [id number]) as of 11/20/2016 10:25  Ref. Range 05/20/2016 09:07 08/19/2016 09:00 11/18/2016 08:04  PSA Latest Ref Range: 0.0 - 4.0 ng/mL 4.3 (H) 4.3 (H) 4.7 (H)   IMPRESSION: Areas of increased activity in the pelvis as described. The sacral area on the left appears to be related to a sacral fracture.  The changes in the superior pubic ramus and inferior pubic ramus on the right may represent an acute fracture adjacent to a mixed lytic and sclerotic lesion. The possibility of some progression would deserve consideration however.  ADDENDUM: Current study is now correlated with bone scan also performed today. The bone scan shows increased radiopharmaceutical uptake in the right pubis. A heterogeneous predominantly lucent bone lesion is seen in the right pubis on today's CT, which appears new compared to earlier CT in 2016. This does not have typical appearance of sclerotic prostate bone metastasis, however differential diagnosis includes both metastasis and subacute to chronic fracture deformity. Recommend pelvic bone MRI for further evaluation.    ASSESSMENT AND PLAN:   56 year old gentleman with the following issues:  1. 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 Casodex was added to his regimen in 05/2011. His PSA remains under reasonable control with low did experience slight increase. Imaging studies including CT scan and bone scan obtained on 11/18/2016 were personally reviewed and discussed with the patient. No evidence of wide metastasis noted although there is an area of concern in the right pubis. These findings could suggest traumatic injury or stress fracture. Metastatic prostate cancer to the bone is certainly a possibility as well. Given these findings, I have recommended  MRI of the pelvis to fully evaluate these findings. If indeed prostate cancer metastasis as suggested, I will switch his Casodex to Zytiga at this time. Complications associated with Zytiga were reviewed today as well as risks and benefits. Complications such as hypertension, electrolyte imbalance, fluid retention among others. We'll make a decision based on his MRI studies. Continuing with Casodex only would also be an option of his MRI showed no clear-cut progression. 2. Androgen deprivation.  Will continue Lupron today and every 3 months. No complications noted at this time. 3. Bone health.  Continue calcium and vitamin D supplementation.  Bone density scan will also be scheduled in the future to ensure adequate bone health. 4. Screening colonoscopy: this was completed in 2015.  5. Muscle pain: Related to excessive exercise. He is to perform adequate stretching and use anti-inflammatories as needed. 6. Followup will be in 3 months.    Zola Button, MD 3/30/201810:23 AM

## 2016-11-20 NOTE — Telephone Encounter (Signed)
Gave patient AVS and calender per 11/20/2016 los. [facility] to contact patient with MRI schedule.

## 2016-11-20 NOTE — Patient Instructions (Signed)
Leuprolide depot injection What is this medicine? LEUPROLIDE (loo PROE lide) is a man-made protein that acts like a natural hormone in the body. It decreases testosterone in men and decreases estrogen in women. In men, this medicine is used to treat advanced prostate cancer. In women, some forms of this medicine may be used to treat endometriosis, uterine fibroids, or other male hormone-related problems. This medicine may be used for other purposes; ask your health care provider or pharmacist if you have questions. What should I tell my health care provider before I take this medicine? They need to know if you have any of these conditions: -diabetes -heart disease or previous heart attack -high blood pressure -high cholesterol -osteoporosis -pain or difficulty passing urine -spinal cord metastasis -stroke -tobacco smoker -unusual vaginal bleeding (women) -an unusual or allergic reaction to leuprolide, benzyl alcohol, other medicines, foods, dyes, or preservatives -pregnant or trying to get pregnant -breast-feeding How should I use this medicine? This medicine is for injection into a muscle or for injection under the skin. It is given by a health care professional in a hospital or clinic setting. The specific product will determine how it will be given to you. Make sure you understand which product you receive and how often you will receive it. Talk to your pediatrician regarding the use of this medicine in children. Special care may be needed. Overdosage: If you think you have taken too much of this medicine contact a poison control center or emergency room at once. NOTE: This medicine is only for you. Do not share this medicine with others. What if I miss a dose? It is important not to miss a dose. Call your doctor or health care professional if you are unable to keep an appointment. Depot injections: Depot injections are given either once-monthly, every 12 weeks, every 16 weeks, or  every 24 weeks depending on the product you are prescribed. The product you are prescribed will be based on if you are male or male, and your condition. Make sure you understand your product and dosing. What may interact with this medicine? Do not take this medicine with any of the following medications: -chasteberry This medicine may also interact with the following medications: -herbal or dietary supplements, like black cohosh or DHEA -male hormones, like estrogens or progestins and birth control pills, patches, rings, or injections -male hormones, like testosterone This list may not describe all possible interactions. Give your health care provider a list of all the medicines, herbs, non-prescription drugs, or dietary supplements you use. Also tell them if you smoke, drink alcohol, or use illegal drugs. Some items may interact with your medicine. What should I watch for while using this medicine? Visit your doctor or health care professional for regular checks on your progress. During the first weeks of treatment, your symptoms may get worse, but then will improve as you continue your treatment. You may get hot flashes, increased bone pain, increased difficulty passing urine, or an aggravation of nerve symptoms. Discuss these effects with your doctor or health care professional, some of them may improve with continued use of this medicine. Male patients may experience a menstrual cycle or spotting during the first months of therapy with this medicine. If this continues, contact your doctor or health care professional. What side effects may I notice from receiving this medicine? Side effects that you should report to your doctor or health care professional as soon as possible: -allergic reactions like skin rash, itching or hives, swelling of the   face, lips, or tongue -breathing problems -chest pain -depression or memory disorders -pain in your legs or groin -pain at site where injected or  implanted -severe headache -swelling of the feet and legs -visual changes -vomiting Side effects that usually do not require medical attention (report to your doctor or health care professional if they continue or are bothersome): -breast swelling or tenderness -decrease in sex drive or performance -diarrhea -hot flashes -loss of appetite -muscle, joint, or bone pains -nausea -redness or irritation at site where injected or implanted -skin problems or acne This list may not describe all possible side effects. Call your doctor for medical advice about side effects. You may report side effects to FDA at 1-800-FDA-1088. Where should I keep my medicine? This drug is given in a hospital or clinic and will not be stored at home. NOTE: This sheet is a summary. It may not cover all possible information. If you have questions about this medicine, talk to your doctor, pharmacist, or health care provider.    2016, Elsevier/Gold Standard. (2014-05-04 14:16:23)  

## 2016-12-04 ENCOUNTER — Ambulatory Visit (HOSPITAL_COMMUNITY)
Admission: RE | Admit: 2016-12-04 | Discharge: 2016-12-04 | Disposition: A | Discharge status: Home / Self Care | Payer: BLUE CROSS/BLUE SHIELD | Source: Ambulatory Visit | Attending: Oncology | Admitting: Oncology

## 2016-12-04 DIAGNOSIS — C7951 Secondary malignant neoplasm of bone: Secondary | ICD-10-CM | POA: Diagnosis not present

## 2016-12-04 DIAGNOSIS — C61 Malignant neoplasm of prostate: Secondary | ICD-10-CM | POA: Insufficient documentation

## 2016-12-04 MED ORDER — GADOBENATE DIMEGLUMINE 529 MG/ML IV SOLN
20.0000 mL | Freq: Once | INTRAVENOUS | Status: AC | PRN
  Administered 2016-12-04: 16 mL via INTRAVENOUS

## 2016-12-07 ENCOUNTER — Other Ambulatory Visit: Payer: Self-pay | Admitting: Oncology

## 2016-12-07 ENCOUNTER — Telehealth: Payer: Self-pay | Admitting: Oncology

## 2016-12-07 MED ORDER — PREDNISONE 5 MG PO TABS: 5.0000 mg | ORAL_TABLET | Freq: Every day | ORAL | 3 refills | Status: DC

## 2016-12-07 MED ORDER — ABIRATERONE ACETATE 250 MG PO TABS: 1000.0000 mg | ORAL_TABLET | Freq: Every day | ORAL | 0 refills | Status: DC

## 2016-12-07 NOTE — Progress Notes (Signed)
The results of the MRI was discussed today with the patient over the phone. 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 Risks and benefits of starting Zytiga and prednisone was reviewed again. Complications that includes hypertension, edema, hypokalemia adrenal insufficiency but overall reviewed. Complications associated with prednisone includes weight gain and hypoglycemia were also discussed. The benefit would be controlling his disease and decreasing his PSA. He is agreeable to proceed at this time. We will arrange follow-up appointment in the next few weeks to discussed his tolerance.

## 2016-12-07 NOTE — Telephone Encounter (Signed)
Called patient to inform him of next scheduled appointment for 4.30.18. LVM

## 2016-12-14 ENCOUNTER — Telehealth: Payer: Self-pay | Admitting: Pharmacist

## 2016-12-14 DIAGNOSIS — C61 Malignant neoplasm of prostate: Secondary | ICD-10-CM

## 2016-12-14 NOTE — Telephone Encounter (Signed)
Oral Chemotherapy Pharmacist Encounter  Received new prescription for Zytiga for metastatic prostate cancer Labs from 11/18/16 reviewed, noted elevated Tbili and LFTs, OK for treatment, this will be monitored Current medication list in Epic assessed, no DDIs with Zytiga identified  Prescription will be e-scribed to [facility] for benefits analysis  Oral Oncology Clinic will continue to follow.  Johny Drilling, PharmD, BCPS, BCOP 12/14/2016  10:02 AM Oral Oncology Clinic [phone]

## 2016-12-21 ENCOUNTER — Ambulatory Visit (HOSPITAL_BASED_OUTPATIENT_CLINIC_OR_DEPARTMENT_OTHER): Payer: BLUE CROSS/BLUE SHIELD | Admitting: Oncology

## 2016-12-21 VITALS — BP 134/85 | HR 61 | Temp 98.0°F | Resp 18 | Ht 68.0 in | Wt 172.0 lb

## 2016-12-21 DIAGNOSIS — E291 Testicular hypofunction: Secondary | ICD-10-CM

## 2016-12-21 DIAGNOSIS — M791 Myalgia: Secondary | ICD-10-CM | POA: Diagnosis not present

## 2016-12-21 DIAGNOSIS — C61 Malignant neoplasm of prostate: Secondary | ICD-10-CM

## 2016-12-21 DIAGNOSIS — C7951 Secondary malignant neoplasm of bone: Secondary | ICD-10-CM | POA: Diagnosis not present

## 2016-12-21 MED ORDER — ABIRATERONE ACETATE 250 MG PO TABS: 1000.0000 mg | ORAL_TABLET | Freq: Every day | ORAL | 0 refills | Status: DC

## 2016-12-21 NOTE — Telephone Encounter (Signed)
Oral Chemotherapy Pharmacist Encounter  Medication was not e-scribed on 12/14/16. Zytiga e-scribed to [facility] today 12/21/16.  Johny Drilling, PharmD, BCPS, BCOP 12/21/2016  11:26 AM Oral Oncology Clinic [phone]

## 2016-12-21 NOTE — Progress Notes (Signed)
Hematology and Oncology Follow Up Visit  David Huffman [id number] May 18, 1961 56 y.o. 12/21/2016 11:45 AM  CC: David Messick, MD    Principle Diagnosis: 56 year old gentleman with prostate cancer diagnosed in March 2008, Gleason score 7, PSA of 31.  Prior Therapy: 1. Status post prostatectomy done in 2008, pathology revealing T2c N0 with 0 out of 12 lymph nodes involved, pathology revealed 4 + 3 equals 7 Gleason score.  2. The patient continued to have rise in his PSA up to 52 without any measurable disease. 3.     Casodex 50 mg daily added in 05/2011. Therapy discontinued in March 2018 because of progression of disease.  Current therapy:  He is on hormonal deprivation, Lupron 22.5 mg every 3 months on a continuous basis started in 2008.  PSA nadir down to 0.4 and then rose up to 2.42 in 05/2011.  Zytiga 1000 mg daily with prednisone 5 mg daily to start in the near future.   Interim History:  David Huffman presents today for a followup visit with his wife. Since the last visit, he reports continuing to have pain in his right shoulder and right side of his upper back. His pain is predominately at nighttime and does not feeling during the daytime. He remains active continuing to lift heavy objects and exercise regularly. This pain has been experienced after using rigorous exercise close to 4 weeks ago.  He has discontinued the Casodex and awaiting the start of Zytiga which will start in the near future. He reports no appetite changes or constitutional symptoms.  He does not report any headaches or blurry vision.does not report any fevers, chills, sweats or weight loss. Does that report any chest pain, palpitation orthopnea. Does not report any change in his bowel habits or nausea or vomiting. Did not report any neurological symptoms. Does not report any frequency urgency or hesitancy. Rest of his review of systems unremarkable.  Medications: I have reviewed the patient's current medications. Current  Outpatient Prescriptions  Medication Sig Dispense Refill  . Ascorbic Acid (VITAMIN C) 1000 MG tablet Take 1,000 mg by mouth daily.    Marland Kitchen b complex vitamins tablet Take 1 tablet by mouth daily.    . Calcium Carbonate-Vitamin D (CALCIUM + D PO) Take 1 tablet by mouth daily.    Marland Kitchen leuprolide (LUPRON) 22.5 MG injection Inject 22.5 mg into the muscle every 3 (three) months.    . Multiple Vitamins-Minerals (MULTIVITAMIN PO) Take 1 tablet by mouth daily.    . Omega-3 Fatty Acids (FISH OIL) 1000 MG CAPS Take 1,000 mg by mouth daily.    Marland Kitchen VITAMIN D, ERGOCALCIFEROL, PO Take 1 tablet by mouth daily.    Marland Kitchen abiraterone Acetate (ZYTIGA) 250 MG tablet Take 4 tablets (1,000 mg total) by mouth daily. Take on an empty stomach 1 hour before or 2 hours after a meal 120 tablet 0  . predniSONE (DELTASONE) 5 MG tablet Take 1 tablet (5 mg total) by mouth daily with breakfast. (Patient not taking: Reported on 12/21/2016) 90 tablet 3   No current facility-administered medications for this visit.     Allergies: No Known Allergies  Past Medical History, Surgical history, Social history, and Family History were reviewed and updated.   Physical Exam: Blood pressure 134/85, pulse 61, temperature 98 F (36.7 C), temperature source Oral, resp. rate 18, height 5\' 8"  (1.727 m), weight 172 lb (78 kg), SpO2 100 %. ECOG: 0 General appearance: Alert, awake gentleman without distress. Head: Normocephalic, without obvious abnormality no oral  ulcers or lesions. Neck: no adenopathy Lymph nodes: Cervical, supraclavicular, and axillary nodes normal. Heart:regular rate and rhythm, S1, S2 normal, no murmur, click, rub or gallop Lung:chest clear, no wheezing, rales, normal symmetric air entry Abdomin: soft, non-tender, without masses or organomegaly. No shifting dullness or ascites. Perirectal examination showed no abscesses or masses. EXT:no erythema, induration, or nodules   Lab Results: Lab Results  Component Value Date   WBC  5.0 11/18/2016   HGB 15.2 11/18/2016   HCT 43.6 11/18/2016   MCV 87.3 11/18/2016   PLT 222 11/18/2016     Chemistry      Component Value Date/Time   NA 141 11/18/2016 0804   K 4.8 11/18/2016 0804   CL 106 01/03/2013 0916   CO2 25 11/18/2016 0804   BUN 19.2 11/18/2016 0804   CREATININE 1.0 11/18/2016 0804      Component Value Date/Time   CALCIUM 10.1 11/18/2016 0804   ALKPHOS 140 11/18/2016 0804   AST 79 (H) 11/18/2016 0804   ALT 122 (H) 11/18/2016 0804   BILITOT 1.27 (H) 11/18/2016 0804       EXAM: MRI OF THE RIGHT HIP WITHOUT AND WITH CONTRAST  TECHNIQUE: Multiplanar, multisequence MR imaging was performed both before and after administration of intravenous contrast.  CONTRAST:  35mL MULTIHANCE GADOBENATE DIMEGLUMINE 529 MG/ML IV SOLN  COMPARISON:  Bone scan dated 11/18/2016 and CT scan dated 11/18/2016  FINDINGS: Bones: There are multiple metastatic lesions in the pelvis including the left side of the S1 segment of the sacrum, a 3.7 x 3.4 x 2.7 cm metastasis of the right pubic body and extension of tumor into the right inferior and superior pubic rami including the right ischial tuberosity. There is a subtle area of increased signal from the posterosuperior aspect of the left acetabulum seen only on the axial views which I suspect is small metastasis as well. This is seen on images 23 and 24 of series 4.  Other findings  There are small enhancing lymph nodes in both inguinal regions seen best on image 31 of series 3 and series 9. Maximum diameter is 10 mm on the right.  IMPRESSION: Metastatic lesions of the right pubic body and right inferior and superior pubic rami, left sacral ala, and left ilium just above the acetabulum.    ASSESSMENT AND PLAN:   56 year old gentleman with the following issues:  1. Castration resistant metastatic prostate cancer with disease to the bone. His initial diagnosis was in March 2008 and has developed  biochemical relapse immediately after his prostatectomy. He was treated with androgen deprivation therapy only for the last 10 years.  His most recent MRI of the hip was discussed and showed metastatic lesions in the right pubic body and the right inferior superior pubic rami. Given these findings, and a recent rise in his PSA he has developed castration resistant disease.  Risks and benefits of Zytiga and prednisone were reviewed today. Complications include nausea, fatigue, hypertension, edema, electrolyte imbalance were discussed and is agreeable to receive this medication. He will started on 1000 mg daily with prednisone at 5 mg daily. For this issue with Fabio Asa is to reduce his cancer burden, decrease his PSA and improve his overall outcome from this cancer. He understand this is not a curative option   2. Androgen depravation: He'll continue on Lupron every 3 months.  3. Bone health: He'll continue on calcium and vitamin D and we'll consider starting Xgeva in the future.  4. Muscular pain: His bone scan  did not show any evidence of bony disease in the upper thorax or shoulder. I have encouraged conservative management at this time including anti-inflammatories.  5. Follow-up: Will be in the next 4-6 weeks to follow his progress and complications associated with Zytiga.   [facility], MD 4/30/201811:45 AM

## 2016-12-22 NOTE — Telephone Encounter (Signed)
Oral Chemotherapy Pharmacist Encounter  Received notification from [facility] that patient's Zytiga prescription would require prior authorization. PA submitted on CoverMyMeds Key [id number] Status is pending  Oral Oncology Clinic will continue to follow.  Johny Drilling, PharmD, BCPS, BCOP 12/22/2016  12:44 PM Oral Oncology Clinic [phone]

## 2016-12-25 ENCOUNTER — Telehealth: Payer: Self-pay | Admitting: *Deleted

## 2016-12-25 ENCOUNTER — Other Ambulatory Visit: Payer: Self-pay | Admitting: *Deleted

## 2016-12-25 MED FILL — ZYTIGA 250 MG TABLET: 250 | 30 days supply | Qty: 120 | Fill #0

## 2016-12-25 NOTE — Telephone Encounter (Signed)
-----   Message from Wyatt Portela, MD sent at 12/25/2016  8:21 AM EDT ----- He has not received his Zytiga yet. Can we look into it?   Thanks,   FS

## 2016-12-25 NOTE — Telephone Encounter (Signed)
Spoke with pharmacist at [facility]. zytiga was approved with a $5.00 co-pay. Patient may p/u partial amount today and the full amount will be here on Monday. They need to order medication. Patient notified.

## 2016-12-28 NOTE — Telephone Encounter (Signed)
Oral Chemotherapy Pharmacist Encounter  Received notification from  [facility] that prior authorization for patient's David Huffman has been approved Effective dates: 12/22/16-12/22/2018  I have alerted [facility] to continue to process prescription.  Oral Oncology Clinic will continue to follow.  Johny Drilling, PharmD, BCPS, BCOP 12/28/2016  12:51 PM Oral Oncology Clinic [phone];

## 2016-12-28 NOTE — Telephone Encounter (Signed)
Oral Chemotherapy Pharmacist Encounter  Noted patient was able to obtain Zytiga at a very reasonable copayment. I called and LVM for patient with offer for initial counseling and to any any questions patient may have about Zytiga.  Oral oncology Clinic will continue to follow.  Johny Drilling, PharmD, BCPS, BCOP 12/28/2016  3:18 PM Oral Oncology Clinic [phone]

## 2017-01-19 ENCOUNTER — Other Ambulatory Visit: Payer: Self-pay | Admitting: Oncology

## 2017-01-19 DIAGNOSIS — C61 Malignant neoplasm of prostate: Secondary | ICD-10-CM

## 2017-01-19 MED FILL — ZYTIGA 250 MG TABLET: 250 | 30 days supply | Qty: 120 | Fill #0

## 2017-02-17 ENCOUNTER — Other Ambulatory Visit: Payer: Self-pay | Admitting: Oncology

## 2017-02-17 ENCOUNTER — Other Ambulatory Visit (HOSPITAL_BASED_OUTPATIENT_CLINIC_OR_DEPARTMENT_OTHER): Payer: BLUE CROSS/BLUE SHIELD

## 2017-02-17 DIAGNOSIS — C61 Malignant neoplasm of prostate: Secondary | ICD-10-CM

## 2017-02-17 LAB — COMPREHENSIVE METABOLIC PANEL
ALBUMIN: 3.7 g/dL (ref 3.5–5.0)
ALK PHOS: 104 U/L (ref 40–150)
ALT: 75 U/L — ABNORMAL HIGH (ref 0–55)
AST: 46 U/L — AB (ref 5–34)
Anion Gap: 12 mEq/L — ABNORMAL HIGH (ref 3–11)
BUN: 18.7 mg/dL (ref 7.0–26.0)
CALCIUM: 10 mg/dL (ref 8.4–10.4)
CO2: 26 mEq/L (ref 22–29)
CREATININE: 1 mg/dL (ref 0.7–1.3)
Chloride: 107 mEq/L (ref 98–109)
EGFR: 82 mL/min/{1.73_m2} — ABNORMAL LOW (ref >90)
Glucose: 108 mg/dl (ref 70–140)
POTASSIUM: 4.9 meq/L (ref 3.5–5.1)
Sodium: 144 mEq/L (ref 136–145)
Total Bilirubin: 1.31 mg/dL — ABNORMAL HIGH (ref 0.20–1.20)
Total Protein: 7.3 g/dL (ref 6.4–8.3)

## 2017-02-17 LAB — CBC WITH DIFFERENTIAL/PLATELET
BASO%: 0.6 % (ref 0.0–2.0)
BASOS ABS: 0 10*3/uL (ref 0.0–0.1)
EOS ABS: 0.2 10*3/uL (ref 0.0–0.5)
EOS%: 3.6 % (ref 0.0–7.0)
HEMATOCRIT: 44.1 % (ref 38.4–49.9)
HEMOGLOBIN: 15 g/dL (ref 13.0–17.1)
LYMPH#: 1.6 10*3/uL (ref 0.9–3.3)
LYMPH%: 34.6 % (ref 14.0–49.0)
MCH: 30.1 pg (ref 27.2–33.4)
MCHC: 34 g/dL (ref 32.0–36.0)
MCV: 88.6 fL (ref 79.3–98.0)
MONO#: 0.6 10*3/uL (ref 0.1–0.9)
MONO%: 13.5 % (ref 0.0–14.0)
NEUT#: 2.2 10*3/uL (ref 1.5–6.5)
NEUT%: 47.7 % (ref 39.0–75.0)
PLATELETS: 181 10*3/uL (ref 140–400)
RBC: 4.98 10*6/uL (ref 4.20–5.82)
RDW: 12.9 % (ref 11.0–14.6)
WBC: 4.6 10*3/uL (ref 4.0–10.3)

## 2017-02-17 MED FILL — ZYTIGA 250 MG TABLET: 250 | 30 days supply | Qty: 120 | Fill #0

## 2017-02-18 LAB — PSA: PROSTATE SPECIFIC AG, SERUM: 5.2 ng/mL — AB (ref 0.0–4.0)

## 2017-02-19 ENCOUNTER — Ambulatory Visit (HOSPITAL_BASED_OUTPATIENT_CLINIC_OR_DEPARTMENT_OTHER): Payer: BLUE CROSS/BLUE SHIELD

## 2017-02-19 ENCOUNTER — Ambulatory Visit (HOSPITAL_BASED_OUTPATIENT_CLINIC_OR_DEPARTMENT_OTHER): Payer: BLUE CROSS/BLUE SHIELD | Admitting: Oncology

## 2017-02-19 ENCOUNTER — Telehealth: Payer: Self-pay | Admitting: Oncology

## 2017-02-19 VITALS — BP 129/73 | HR 66 | Temp 97.7°F | Resp 20 | Ht 68.0 in | Wt 168.2 lb

## 2017-02-19 DIAGNOSIS — E291 Testicular hypofunction: Secondary | ICD-10-CM

## 2017-02-19 DIAGNOSIS — Z5111 Encounter for antineoplastic chemotherapy: Secondary | ICD-10-CM | POA: Diagnosis not present

## 2017-02-19 DIAGNOSIS — C61 Malignant neoplasm of prostate: Secondary | ICD-10-CM

## 2017-02-19 DIAGNOSIS — C7951 Secondary malignant neoplasm of bone: Secondary | ICD-10-CM

## 2017-02-19 MED ORDER — LEUPROLIDE ACETATE (4 MONTH) 30 MG IM KIT
22.5000 mg | PACK | Freq: Once | INTRAMUSCULAR | Status: AC
  Administered 2017-02-19: 22.5 mg via INTRAMUSCULAR
  Filled 2017-02-19: qty 30

## 2017-02-19 NOTE — Progress Notes (Signed)
Hematology and Oncology Follow Up Visit  David Huffman [id number] 15-Mar-1961 56 y.o. 02/19/2017 9:56 AM  CC: David Messick, MD    Principle Diagnosis: 56 year old gentleman with prostate cancer diagnosed in March 2008, Gleason score 7, PSA of 31.  Prior Therapy: 1. Status post prostatectomy done in 2008, pathology revealing T2c N0 with 0 out of 12 lymph nodes involved, pathology revealed 4 + 3 equals 7 Gleason score.  2. The patient continued to have rise in his PSA up to 52 without any measurable disease. 3.     Casodex 50 mg daily added in 05/2011. Therapy discontinued in March 2018 because of progression of disease.  Current therapy:  He is on hormonal deprivation, Lupron 22.5 mg every 3 months on a continuous basis started in 2008.  PSA nadir down to 0.4 and then rose up to 2.42 in 05/2011.  Zytiga 1000 mg daily with prednisone 5 mg daily Started in May 2018.   Interim History:  David Huffman presents today for a followup visit with his wife. Since the last visit, he reports feeling well without any recent complaints. He started Zytiga and prednisone without complications. He denied any nausea or edema. He did report some mild fatigue associated with it. He has resumed most activities of daily living and his shoulder pain is also significantly improved. He still able to travel and attends to activities of daily living. He exercises heavily without any decline. He denied any bone pain or pathological fractures.  He does not report any headaches or blurry vision.does not report any fevers, chills, sweats or weight loss. Does that report any chest pain, palpitation orthopnea. Does not report any change in his bowel habits or nausea or vomiting. Did not report any neurological symptoms. Does not report any frequency urgency or hesitancy. Rest of his review of systems unremarkable.  Medications: I have reviewed the patient's current medications. Current Outpatient Prescriptions  Medication Sig  Dispense Refill  . Ascorbic Acid (VITAMIN C) 1000 MG tablet Take 1,000 mg by mouth daily.    Marland Kitchen b complex vitamins tablet Take 1 tablet by mouth daily.    . Calcium Carbonate-Vitamin D (CALCIUM + D PO) Take 1 tablet by mouth daily.    Marland Kitchen leuprolide (LUPRON) 22.5 MG injection Inject 22.5 mg into the muscle every 3 (three) months.    . Multiple Vitamins-Minerals (MULTIVITAMIN PO) Take 1 tablet by mouth daily.    . Omega-3 Fatty Acids (FISH OIL) 1000 MG CAPS Take 1,000 mg by mouth daily.    . predniSONE (DELTASONE) 5 MG tablet Take 1 tablet (5 mg total) by mouth daily with breakfast. 90 tablet 3  . VITAMIN D, ERGOCALCIFEROL, PO Take 1 tablet by mouth daily.    Marland Kitchen ZYTIGA 250 MG tablet TAKE 4 TABLETS BY MOUTH DAILY ON AN EMPTY STOMACH 1 HOUR BEFORE OR 2 HOURS AFTER A MEAL 120 tablet 0   No current facility-administered medications for this visit.    Facility-Administered Medications Ordered in Other Visits  Medication Dose Route Frequency Provider Last Rate Last Dose  . leuprolide (LUPRON) injection 22.5 mg  22.5 mg Intramuscular Once Railyn House, Mathis Dad, MD        Allergies: No Known Allergies  Past Medical History, Surgical history, Social history, and Family History were reviewed and updated.   Physical Exam: Blood pressure 129/73, pulse 66, temperature 97.7 F (36.5 C), temperature source Oral, resp. rate 20, height 5\' 8"  (1.727 m), weight 168 lb 3.2 oz (76.3 kg), SpO2 100 %.  ECOG: 0 General appearance: Well-appearing gentleman appeared without distress. Head: Normocephalic, without obvious abnormality no oral thrush or ulcers. Neck: no adenopathy Lymph nodes: Cervical, supraclavicular, and axillary nodes normal. Heart:regular rate and rhythm, S1, S2 normal, no murmur, click, rub or gallop Lung:chest clear, no wheezing, rales, normal symmetric air entry Abdomin: soft, non-tender, without masses or organomegaly. No rebound or guarding. Perirectal examination showed no abscesses or  masses. EXT:no erythema, induration, or nodules   Lab Results: Lab Results  Component Value Date   WBC 4.6 02/17/2017   HGB 15.0 02/17/2017   HCT 44.1 02/17/2017   MCV 88.6 02/17/2017   PLT 181 02/17/2017     Chemistry      Component Value Date/Time   NA 144 02/17/2017 0841   K 4.9 02/17/2017 0841   CL 106 01/03/2013 0916   CO2 26 02/17/2017 0841   BUN 18.7 02/17/2017 0841   CREATININE 1.0 02/17/2017 0841      Component Value Date/Time   CALCIUM 10.0 02/17/2017 0841   ALKPHOS 104 02/17/2017 0841   AST 46 (H) 02/17/2017 0841   ALT 75 (H) 02/17/2017 0841   BILITOT 1.31 (H) 02/17/2017 0841      Results for David Huffman (MRN [id number]) as of 02/19/2017 09:11  Ref. Range 08/19/2016 09:00 11/18/2016 08:04 02/17/2017 08:41  PSA Latest Ref Range: 0.0 - 4.0 ng/mL 4.3 (H) 4.7 (H) 5.2 (H)     ASSESSMENT AND PLAN:   56 year old gentleman with the following issues:  1. Castration resistant metastatic prostate cancer with disease to the bone. His initial diagnosis was in March 2008 and has developed biochemical relapse immediately after his prostatectomy. He was treated with androgen deprivation therapy only for the last 10 years.  MRI of the hip on 12/04/2016 showed metastatic lesions in the right pubic body and the right inferior superior pubic rami.   He is currently receiving Zytiga and prednisone without major complications.   His PSA has not shown any response as of yet but may be too early to judge. The plan is to continue with the same dose and schedule and repeat his PSA in 6 weeks.   2. Androgen depravation: He'll continue on Lupron every 3 months. He will be due for injection on 02/19/2017.  3. Bone health: He'll continue on calcium and vitamin D and we'll consider starting Xgeva in the future.  4. Muscular pain: This is resolved. Appears to be related to her exercise rather than metastatic disease.  5. Follow-up: Will be in the next 6 weeks to follow his progress  and complications associated with Zytiga.   [facility], MD 6/29/20189:56 AM

## 2017-02-19 NOTE — Telephone Encounter (Signed)
Scheduled appt per 6/29 los - Gave patient AVS and calender per los. Per Dr. Alen Blew okay for 8/24 f/u instead of 8/23.

## 2017-03-17 ENCOUNTER — Telehealth: Payer: Self-pay | Admitting: Pharmacist

## 2017-03-17 ENCOUNTER — Other Ambulatory Visit: Payer: Self-pay | Admitting: Oncology

## 2017-03-17 DIAGNOSIS — C61 Malignant neoplasm of prostate: Secondary | ICD-10-CM

## 2017-03-17 NOTE — Telephone Encounter (Signed)
Oral Chemotherapy Pharmacist Encounter  I spoke with patient for overview of oral chemotherapy medication: Zytiga for the treatment of castration resistant metastatic prostate cancer, planned duration of until disease progression or unacceptable drug toxicity.   Pt is doing well. The prescriptions is being filled at [facility].  Counseled patient on administration, dosing, side effects, safe handling, and monitoring. Side effects include but not limited to: fatigue, headache, hot flashes. Elevated blood pressure. Patient reports that he has experienced hot flashes but that those were not new with this medication.  Patient takes Zytiga 1000 mg by mouth daily on an empty stomach. Start date of 12/21/16.   Reviewed with patient importance of keeping a medication schedule and plan for any missed doses. Patient reports taking his medication in the morning and not missing any doses since starting this medication, although one day he forgot to take on an empty stomach.   He voiced understanding and appreciation. Patient did mention that he was low on his prednisone. I looked at the patient's prednisone Rx within [facility] [place] and he should have refilled left on his Rx. Instructed the patient to [facility] to [facility] a refill. Patient also has a question about drinking alcohol with his medication. Patient reported drinking 1-2 beers occasionally throughout the week in the evening. Let the patient know that we get concerned when patients drink large amounts of alcohol/binge drink on a regular basis. Told patient his occasional 1-2 beers would not a an issue but if his intake increases that maybe of concern.  Ensure all questions were answered.  Will follow up with patient regarding insurance and pharmacy.   Patient knows to call the office with questions or concerns. Oral Oncology Clinic will continue to follow.  Thank you,  Nuala Alpha, PharmD, BCPS 03/17/2017  10:38 AM Oral  Oncology Clinic [phone]

## 2017-03-23 MED FILL — ZYTIGA 250 MG TABLET: 250 | 30 days supply | Qty: 120 | Fill #0

## 2017-04-13 ENCOUNTER — Other Ambulatory Visit (HOSPITAL_BASED_OUTPATIENT_CLINIC_OR_DEPARTMENT_OTHER): Payer: BLUE CROSS/BLUE SHIELD

## 2017-04-13 DIAGNOSIS — C61 Malignant neoplasm of prostate: Secondary | ICD-10-CM | POA: Diagnosis not present

## 2017-04-13 LAB — CBC WITH DIFFERENTIAL/PLATELET
BASO%: 0.7 % (ref 0.0–2.0)
Basophils Absolute: 0 10*3/uL (ref 0.0–0.1)
EOS%: 3.2 % (ref 0.0–7.0)
Eosinophils Absolute: 0.2 10*3/uL (ref 0.0–0.5)
HCT: 41.1 % (ref 38.4–49.9)
HEMOGLOBIN: 14.1 g/dL (ref 13.0–17.1)
LYMPH%: 44.9 % (ref 14.0–49.0)
MCH: 30 pg (ref 27.2–33.4)
MCHC: 34.4 g/dL (ref 32.0–36.0)
MCV: 87.2 fL (ref 79.3–98.0)
MONO#: 0.4 10*3/uL (ref 0.1–0.9)
MONO%: 8.2 % (ref 0.0–14.0)
NEUT%: 43 % (ref 39.0–75.0)
NEUTROS ABS: 2.3 10*3/uL (ref 1.5–6.5)
Platelets: 193 10*3/uL (ref 140–400)
RBC: 4.71 10*6/uL (ref 4.20–5.82)
RDW: 13.3 % (ref 11.0–14.6)
WBC: 5.5 10*3/uL (ref 4.0–10.3)
lymph#: 2.5 10*3/uL (ref 0.9–3.3)

## 2017-04-13 LAB — COMPREHENSIVE METABOLIC PANEL
ALBUMIN: 3.7 g/dL (ref 3.5–5.0)
ALK PHOS: 90 U/L (ref 40–150)
ALT: 50 U/L (ref 0–55)
AST: 35 U/L — AB (ref 5–34)
Anion Gap: 6 mEq/L (ref 3–11)
BILIRUBIN TOTAL: 1.38 mg/dL — AB (ref 0.20–1.20)
BUN: 15.3 mg/dL (ref 7.0–26.0)
CO2: 27 mEq/L (ref 22–29)
Calcium: 9.7 mg/dL (ref 8.4–10.4)
Chloride: 108 mEq/L (ref 98–109)
Creatinine: 0.9 mg/dL (ref 0.7–1.3)
EGFR: >90 mL/min/{1.73_m2} (ref >90)
GLUCOSE: 90 mg/dL (ref 70–140)
POTASSIUM: 4.1 meq/L (ref 3.5–5.1)
SODIUM: 140 meq/L (ref 136–145)
TOTAL PROTEIN: 6.6 g/dL (ref 6.4–8.3)

## 2017-04-14 LAB — PSA: PROSTATE SPECIFIC AG, SERUM: 3.8 ng/mL (ref 0.0–4.0)

## 2017-04-16 ENCOUNTER — Other Ambulatory Visit: Payer: Self-pay | Admitting: Oncology

## 2017-04-16 ENCOUNTER — Telehealth: Payer: Self-pay

## 2017-04-16 ENCOUNTER — Ambulatory Visit (HOSPITAL_BASED_OUTPATIENT_CLINIC_OR_DEPARTMENT_OTHER): Payer: BLUE CROSS/BLUE SHIELD | Admitting: Oncology

## 2017-04-16 VITALS — BP 152/88 | HR 104 | Temp 97.0°F | Resp 18 | Ht 68.0 in | Wt 169.4 lb

## 2017-04-16 DIAGNOSIS — C61 Malignant neoplasm of prostate: Secondary | ICD-10-CM

## 2017-04-16 DIAGNOSIS — C7951 Secondary malignant neoplasm of bone: Secondary | ICD-10-CM

## 2017-04-16 DIAGNOSIS — E291 Testicular hypofunction: Secondary | ICD-10-CM

## 2017-04-16 MED FILL — ZYTIGA 250 MG TABLET: 250 | 30 days supply | Qty: 120 | Fill #0

## 2017-04-16 NOTE — Progress Notes (Signed)
Hematology and Oncology Follow Up Visit  David Huffman [id number] 1961/03/14 56 y.o. 04/16/2017 2:01 PM  CC: David Messick, MD    Principle Diagnosis: 56 year old gentleman with prostate cancer diagnosed in March 2008, Gleason score 7, PSA of 31.  Prior Therapy: 1. Status post prostatectomy done in 2008, pathology revealing T2c N0 with 0 out of 12 lymph nodes involved, pathology revealed 4 + 3 equals 7 Gleason score.  2. The patient continued to have rise in his PSA up to 52 without any measurable disease. 3.     Casodex 50 mg daily added in 05/2011. Therapy discontinued in March 2018 because of progression of disease.  Current therapy:  He is on hormonal deprivation, Lupron 22.5 mg every 3 months on a continuous basis started in 2008.  PSA nadir down to 0.4 and then rose up to 2.42 in 05/2011.  Zytiga 1000 mg daily with prednisone 5 mg daily Started in May 2018.   Interim History:  David Huffman presents today for a followup visit with his wife. Since the last visit, he reports feeling eell without any changes in his health. He continues to take Zytiga and prednisone without complications. He denied any nausea or edema. He has resumed most activities of daily living and he exercises heavily without any decline. He denied any bone pain or pathological fractures. He denied any urination difficulties and denied any hematuria.  He does not report any headaches or blurry vision.does not report any fevers, chills, sweats or weight loss. Does that report any chest pain, palpitation orthopnea. Does not report any change in his bowel habits or nausea or vomiting. Did not report any neurological symptoms. Does not report any frequency urgency or hesitancy. Rest of his review of systems unremarkable.  Medications: I have reviewed the patient's current medications. Current Outpatient Prescriptions  Medication Sig Dispense Refill  . Ascorbic Acid (VITAMIN C) 1000 MG tablet Take 1,000 mg by mouth daily.    Marland Kitchen  b complex vitamins tablet Take 1 tablet by mouth daily.    . Calcium Carbonate-Vitamin D (CALCIUM + D PO) Take 1 tablet by mouth daily.    Marland Kitchen leuprolide (LUPRON) 22.5 MG injection Inject 22.5 mg into the muscle every 3 (three) months.    . Multiple Vitamins-Minerals (MULTIVITAMIN PO) Take 1 tablet by mouth daily.    . Omega-3 Fatty Acids (FISH OIL) 1000 MG CAPS Take 1,000 mg by mouth daily.    . predniSONE (DELTASONE) 5 MG tablet Take 1 tablet (5 mg total) by mouth daily with breakfast. 90 tablet 3  . VITAMIN D, ERGOCALCIFEROL, PO Take 1 tablet by mouth daily.    Marland Kitchen ZYTIGA 250 MG tablet TAKE 4 TABLETS BY MOUTH DAILY ON AN EMPTY STOMACH 1 HOUR BEFORE OR 2 HOURS AFTER A MEAL 120 tablet 0   No current facility-administered medications for this visit.     Allergies: No Known Allergies  Past Medical History, Surgical history, Social history, and Family History were reviewed and updated.   Physical Exam: Blood pressure (!) 152/88, pulse (!) 104, temperature (!) 97 F (36.1 C), temperature source Oral, resp. rate 18, height 5\' 8"  (1.727 m), weight 169 lb 6.4 oz (76.8 kg), SpO2 97 %. ECOG: 0 General appearance: Alert, awake gentleman without distress. Head: Normocephalic, without obvious abnormality no oral ulcers or lesions. Neck: no adenopathy Lymph nodes: Cervical, supraclavicular, and axillary nodes normal. Heart:regular rate and rhythm, S1, S2 normal, no murmur, click, rub or gallop Lung:chest clear, no wheezing, rales, normal symmetric  air entry Abdomin: soft, non-tender, without masses or organomegaly. No shifting dullness or ascites. Perirectal examination showed no abscesses or masses. EXT:no erythema, induration, or nodules   Lab Results: Lab Results  Component Value Date   WBC 5.5 04/13/2017   HGB 14.1 04/13/2017   HCT 41.1 04/13/2017   MCV 87.2 04/13/2017   PLT 193 04/13/2017     Chemistry      Component Value Date/Time   NA 140 04/13/2017 0743   K 4.1 04/13/2017 0743    CL 106 01/03/2013 0916   CO2 27 04/13/2017 0743   BUN 15.3 04/13/2017 0743   CREATININE 0.9 04/13/2017 0743      Component Value Date/Time   CALCIUM 9.7 04/13/2017 0743   ALKPHOS 90 04/13/2017 0743   AST 35 (H) 04/13/2017 0743   ALT 50 04/13/2017 0743   BILITOT 1.38 (H) 04/13/2017 0743     Results for David Huffman, David Huffman (MRN [id number]) as of 04/16/2017 14:03  Ref. Range 02/17/2017 08:41 04/13/2017 07:42  Prostate Specific Ag, Serum Latest Ref Range: 0.0 - 4.0 ng/mL 5.2 (H) 3.8       ASSESSMENT AND PLAN:   56 year old gentleman with the following issues:  1. Castration resistant metastatic prostate cancer with disease to the bone. His initial diagnosis was in March 2008 and has developed biochemical relapse immediately after his prostatectomy. He was treated with androgen deprivation therapy only for the last 10 years.  MRI of the hip on 12/04/2016 showed metastatic lesions in the right pubic body and the right inferior superior pubic rami.   He is currently receiving Zytiga and prednisone without major complications.   His PSA showed a reasonable decline in the last few months currently at 3.8. Risks and benefits of continuing this medication were discussed today and is agreeable to continue.   2. Androgen depravation: He'll continue on Lupron every 3 months. He will receive his next injection on 05/21/2017.  3. Bone health: He'll continue on calcium and vitamin D. We discussed the risks and benefits of Xgeva today given his recent bone metastasis. Complications associated with this medication includes fatigue, flulike symptoms, hypocalcemia and osteonecrosis of the jaw. The time being we elected to defer this treatment to a later date.  4. Muscular pain: This is resolved. Appears to be related to her exercise rather than metastatic disease.  5. Follow-up: Will be in the next 4 weeks to follow his progress and complications associated with Zytiga.   Zola Button,  MD 8/24/20182:01 PM

## 2017-04-16 NOTE — Telephone Encounter (Signed)
Gave patient avs, and calender for upcoming appointment.

## 2017-05-12 ENCOUNTER — Other Ambulatory Visit: Payer: Self-pay | Admitting: Oncology

## 2017-05-12 DIAGNOSIS — C61 Malignant neoplasm of prostate: Secondary | ICD-10-CM

## 2017-05-12 MED FILL — ZYTIGA 250 MG TABLET: 250 | 30 days supply | Qty: 120 | Fill #0

## 2017-05-19 ENCOUNTER — Ambulatory Visit (HOSPITAL_BASED_OUTPATIENT_CLINIC_OR_DEPARTMENT_OTHER): Payer: BLUE CROSS/BLUE SHIELD

## 2017-05-19 DIAGNOSIS — C61 Malignant neoplasm of prostate: Secondary | ICD-10-CM | POA: Diagnosis not present

## 2017-05-19 LAB — COMPREHENSIVE METABOLIC PANEL
ALBUMIN: 3.9 g/dL (ref 3.5–5.0)
ALK PHOS: 98 U/L (ref 40–150)
ALT: 62 U/L — ABNORMAL HIGH (ref 0–55)
AST: 44 U/L — ABNORMAL HIGH (ref 5–34)
Anion Gap: 8 mEq/L (ref 3–11)
BUN: 12 mg/dL (ref 7.0–26.0)
CALCIUM: 9.6 mg/dL (ref 8.4–10.4)
CO2: 27 mEq/L (ref 22–29)
Chloride: 107 mEq/L (ref 98–109)
Creatinine: 0.9 mg/dL (ref 0.7–1.3)
EGFR: >90 mL/min/{1.73_m2} (ref >90)
Glucose: 96 mg/dl (ref 70–140)
POTASSIUM: 4.4 meq/L (ref 3.5–5.1)
Sodium: 142 mEq/L (ref 136–145)
Total Bilirubin: 1.58 mg/dL — ABNORMAL HIGH (ref 0.20–1.20)
Total Protein: 6.9 g/dL (ref 6.4–8.3)

## 2017-05-19 LAB — CBC WITH DIFFERENTIAL/PLATELET
BASO%: 0.6 % (ref 0.0–2.0)
BASOS ABS: 0 10*3/uL (ref 0.0–0.1)
EOS ABS: 0.2 10*3/uL (ref 0.0–0.5)
EOS%: 2.9 % (ref 0.0–7.0)
HEMATOCRIT: 41.5 % (ref 38.4–49.9)
HEMOGLOBIN: 14.1 g/dL (ref 13.0–17.1)
LYMPH#: 2.2 10*3/uL (ref 0.9–3.3)
LYMPH%: 43 % (ref 14.0–49.0)
MCH: 29.9 pg (ref 27.2–33.4)
MCHC: 34 g/dL (ref 32.0–36.0)
MCV: 88.1 fL (ref 79.3–98.0)
MONO#: 0.6 10*3/uL (ref 0.1–0.9)
MONO%: 12.1 % (ref 0.0–14.0)
NEUT#: 2.1 10*3/uL (ref 1.5–6.5)
NEUT%: 41.4 % (ref 39.0–75.0)
Platelets: 195 10*3/uL (ref 140–400)
RBC: 4.71 10*6/uL (ref 4.20–5.82)
RDW: 12.8 % (ref 11.0–14.6)
WBC: 5.1 10*3/uL (ref 4.0–10.3)

## 2017-05-20 LAB — PSA: PROSTATE SPECIFIC AG, SERUM: 4.3 ng/mL — AB (ref 0.0–4.0)

## 2017-05-21 ENCOUNTER — Ambulatory Visit (HOSPITAL_BASED_OUTPATIENT_CLINIC_OR_DEPARTMENT_OTHER): Payer: BLUE CROSS/BLUE SHIELD

## 2017-05-21 ENCOUNTER — Ambulatory Visit (HOSPITAL_BASED_OUTPATIENT_CLINIC_OR_DEPARTMENT_OTHER): Payer: BLUE CROSS/BLUE SHIELD | Admitting: Oncology

## 2017-05-21 ENCOUNTER — Telehealth: Payer: Self-pay | Admitting: Oncology

## 2017-05-21 ENCOUNTER — Ambulatory Visit: Payer: BLUE CROSS/BLUE SHIELD | Admitting: Oncology

## 2017-05-21 VITALS — BP 140/77 | HR 72 | Temp 97.8°F | Resp 17 | Ht 68.0 in | Wt 167.5 lb

## 2017-05-21 DIAGNOSIS — C7951 Secondary malignant neoplasm of bone: Secondary | ICD-10-CM | POA: Diagnosis not present

## 2017-05-21 DIAGNOSIS — Z5111 Encounter for antineoplastic chemotherapy: Secondary | ICD-10-CM

## 2017-05-21 DIAGNOSIS — M791 Myalgia: Secondary | ICD-10-CM

## 2017-05-21 DIAGNOSIS — C61 Malignant neoplasm of prostate: Secondary | ICD-10-CM

## 2017-05-21 DIAGNOSIS — E291 Testicular hypofunction: Secondary | ICD-10-CM | POA: Diagnosis not present

## 2017-05-21 MED ORDER — LEUPROLIDE ACETATE (4 MONTH) 30 MG IM KIT
22.5000 mg | PACK | Freq: Once | INTRAMUSCULAR | Status: AC
  Administered 2017-05-21: 22.5 mg via INTRAMUSCULAR
  Filled 2017-05-21: qty 30

## 2017-05-21 NOTE — Telephone Encounter (Signed)
Gave avs and calendar november

## 2017-05-21 NOTE — Progress Notes (Signed)
Hematology and Oncology Follow Up Visit  Jaquawn Saffran [id number] 06-03-1961 56 y.o. 05/21/2017 4:24 PM  CC: [name], MD    Principle Diagnosis: 56 year old gentleman with prostate cancer diagnosed in March 2008, Gleason score 7, PSA of 31.  Prior Therapy: 1. Status post prostatectomy done in 2008, pathology revealing T2c N0 with 0 out of 12 lymph nodes involved, pathology revealed 4 + 3 equals 7 Gleason score.  2. The patient continued to have rise in his PSA up to 52 without any measurable disease. 3.     Casodex 50 mg daily added in 05/2011. Therapy discontinued in March 2018 because of progression of disease.  Current therapy:  He is on hormonal deprivation, Lupron 22.5 mg every 3 months on a continuous basis started in 2008.  PSA nadir down to 0.4 and then rose up to 2.42 in 05/2011.  Zytiga 1000 mg daily with prednisone 5 mg daily Started in May 2018.   Interim History:  Mr. Gum presents today for a followup visit with his wife. Since the last visit, he reports No complaints. He does have some mild fatigue which is slightly more noticeable since the last visit. Despite his fatigue, he still able to work full time and exercises regularly. He continues to take Zytiga and prednisone without any other complications. He denied any nausea or edema. He denied any bone pain or pathological fractures. He denied any pelvic pain or discomfort He denied any urination difficulties and denied any hematuria.  He does not report any headaches or blurry vision.does not report any fevers, chills, sweats or weight loss. Does that report any chest pain, palpitation orthopnea. Does not report any change in his bowel habits or nausea or vomiting. Did not report any neurological symptoms. Does not report any frequency urgency or hesitancy. Rest of his review of systems unremarkable.  Medications: I have reviewed the patient's current medications. Current Outpatient Prescriptions  Medication Sig Dispense  Refill  . Ascorbic Acid (VITAMIN C) 1000 MG tablet Take 1,000 mg by mouth daily.    Marland Kitchen b complex vitamins tablet Take 1 tablet by mouth daily.    . Calcium Carbonate-Vitamin D (CALCIUM + D PO) Take 1 tablet by mouth daily.    Marland Kitchen leuprolide (LUPRON) 22.5 MG injection Inject 22.5 mg into the muscle every 3 (three) months.    . Multiple Vitamins-Minerals (MULTIVITAMIN PO) Take 1 tablet by mouth daily.    . Omega-3 Fatty Acids (FISH OIL) 1000 MG CAPS Take 1,000 mg by mouth daily.    . predniSONE (DELTASONE) 5 MG tablet Take 1 tablet (5 mg total) by mouth daily with breakfast. 90 tablet 3  . VITAMIN D, ERGOCALCIFEROL, PO Take 1 tablet by mouth daily.    Marland Kitchen ZYTIGA 250 MG tablet TAKE 4 TABLETS BY MOUTH DAILY ON AN EMPTY STOMACH 1 HOUR BEFORE OR 2 HOURS AFTER A MEAL 120 tablet 0   No current facility-administered medications for this visit.     Allergies: No Known Allergies  Past Medical History, Surgical history, Social history, and Family History were reviewed and updated.   Physical Exam: Blood pressure 140/77, pulse 72, temperature 97.8 F (36.6 C), temperature source Oral, resp. rate 17, height 5\' 8"  (1.727 m), weight 167 lb 8 oz (76 kg), SpO2 100 %. ECOG: 0 General appearance: Well-appearing gentleman without distress. Head: Normocephalic, without obvious abnormality no oral thrush or ulcers. Neck: no adenopathy Lymph nodes: Cervical, supraclavicular, and axillary nodes normal. Heart:regular rate and rhythm, S1, S2 normal, no  murmur, click, rub or gallop Lung:chest clear, no wheezing, rales, normal symmetric air entry Abdomin: soft, non-tender, without masses or organomegaly. No rebound or guarding. Perirectal examination showed no abscesses or masses. EXT:no erythema, induration, or nodules   Lab Results: Lab Results  Component Value Date   WBC 5.1 05/19/2017   HGB 14.1 05/19/2017   HCT 41.5 05/19/2017   MCV 88.1 05/19/2017   PLT 195 05/19/2017     Chemistry      Component  Value Date/Time   NA 142 05/19/2017 0748   K 4.4 05/19/2017 0748   CL 106 01/03/2013 0916   CO2 27 05/19/2017 0748   BUN 12.0 05/19/2017 0748   CREATININE 0.9 05/19/2017 0748      Component Value Date/Time   CALCIUM 9.6 05/19/2017 0748   ALKPHOS 98 05/19/2017 0748   AST 44 (H) 05/19/2017 0748   ALT 62 (H) 05/19/2017 0748   BILITOT 1.58 (H) 05/19/2017 0748      Results for CHAVEZ, ROSOL (MRN [id number]) as of 05/21/2017 15:23  Ref. Range 02/17/2017 08:41 04/13/2017 07:42 05/19/2017 07:48  Prostate Specific Ag, Serum Latest Ref Range: 0.0 - 4.0 ng/mL 5.2 (H) 3.8 4.3 (H)       ASSESSMENT AND PLAN:   56 year old gentleman with the following issues:  1. Castration resistant metastatic prostate cancer with disease to the bone. His initial diagnosis was in March 2008 and has developed biochemical relapse immediately after his prostatectomy. He was treated with androgen deprivation therapy only for the last 10 years.  MRI of the hip on 12/04/2016 showed metastatic lesions in the right pubic body and the right inferior superior pubic rami.   He is currently receiving Zytiga and prednisone.  His PSA Continues to be relatively stable with a slight increase to 4.3. He denied any complications related to this medication since last visit. The plan is to continue with the same dose and schedule and we'll repeat imaging studies in January 2019.   2. Androgen depravation: He'll continue on Lupron every 3 months. He will receive  injection on 05/21/2017. Repeated in 3 months.  3. Bone health: He'll continue on calcium and vitamin D. We discussed the risks and benefits of Xgeva today given his recent bone metastasis. Complications associated with this medication includes fatigue, flulike symptoms, hypocalcemia and osteonecrosis of the jaw. The time being we elected to defer this treatment to a later date.  4. Muscular pain: Much improved at this time.  5. Follow-up: Will be in the next 6 weeks to  follow his progress and complications associated with Zytiga.   Zola Button, MD 9/28/20184:24 PM

## 2017-06-03 ENCOUNTER — Other Ambulatory Visit: Payer: Self-pay | Admitting: Oncology

## 2017-06-03 DIAGNOSIS — C61 Malignant neoplasm of prostate: Secondary | ICD-10-CM

## 2017-06-08 ENCOUNTER — Other Ambulatory Visit: Payer: Self-pay | Admitting: *Deleted

## 2017-06-08 DIAGNOSIS — C61 Malignant neoplasm of prostate: Secondary | ICD-10-CM

## 2017-06-09 ENCOUNTER — Telehealth: Payer: Self-pay | Admitting: *Deleted

## 2017-06-09 ENCOUNTER — Other Ambulatory Visit: Payer: Self-pay | Admitting: *Deleted

## 2017-06-09 MED ORDER — PREDNISONE 5 MG PO TABS: 5.0000 mg | ORAL_TABLET | Freq: Every day | ORAL | 3 refills | Status: DC

## 2017-06-09 NOTE — Telephone Encounter (Signed)
Spoke with patient.  Prednisone refilled.

## 2017-06-14 MED FILL — ZYTIGA 250 MG TABLET: 250 | 30 days supply | Qty: 120 | Fill #0

## 2017-06-29 ENCOUNTER — Other Ambulatory Visit (HOSPITAL_BASED_OUTPATIENT_CLINIC_OR_DEPARTMENT_OTHER): Payer: BLUE CROSS/BLUE SHIELD

## 2017-06-29 DIAGNOSIS — C61 Malignant neoplasm of prostate: Secondary | ICD-10-CM | POA: Diagnosis not present

## 2017-06-29 LAB — COMPREHENSIVE METABOLIC PANEL
ALBUMIN: 3.9 g/dL (ref 3.5–5.0)
ALK PHOS: 89 U/L (ref 40–150)
ALT: 48 U/L (ref 0–55)
ANION GAP: 6 meq/L (ref 3–11)
AST: 38 U/L — ABNORMAL HIGH (ref 5–34)
BUN: 15.8 mg/dL (ref 7.0–26.0)
CALCIUM: 9.6 mg/dL (ref 8.4–10.4)
CHLORIDE: 108 meq/L (ref 98–109)
CO2: 27 mEq/L (ref 22–29)
CREATININE: 0.9 mg/dL (ref 0.7–1.3)
EGFR: >60 mL/min/{1.73_m2} (ref >60)
Glucose: 82 mg/dl (ref 70–140)
POTASSIUM: 4.5 meq/L (ref 3.5–5.1)
Sodium: 142 mEq/L (ref 136–145)
Total Bilirubin: 1.48 mg/dL — ABNORMAL HIGH (ref 0.20–1.20)
Total Protein: 6.8 g/dL (ref 6.4–8.3)

## 2017-06-29 LAB — CBC WITH DIFFERENTIAL/PLATELET
BASO%: 0.9 % (ref 0.0–2.0)
Basophils Absolute: 0 10*3/uL (ref 0.0–0.1)
EOS ABS: 0.2 10*3/uL (ref 0.0–0.5)
EOS%: 4 % (ref 0.0–7.0)
HCT: 42.3 % (ref 38.4–49.9)
HEMOGLOBIN: 14.3 g/dL (ref 13.0–17.1)
LYMPH%: 45.3 % (ref 14.0–49.0)
MCH: 29.9 pg (ref 27.2–33.4)
MCHC: 33.8 g/dL (ref 32.0–36.0)
MCV: 88.5 fL (ref 79.3–98.0)
MONO#: 0.5 10*3/uL (ref 0.1–0.9)
MONO%: 10.4 % (ref 0.0–14.0)
NEUT%: 39.4 % (ref 39.0–75.0)
NEUTROS ABS: 2 10*3/uL (ref 1.5–6.5)
Platelets: 208 10*3/uL (ref 140–400)
RBC: 4.78 10*6/uL (ref 4.20–5.82)
RDW: 12.9 % (ref 11.0–14.6)
WBC: 5.1 10*3/uL (ref 4.0–10.3)
lymph#: 2.3 10*3/uL (ref 0.9–3.3)

## 2017-06-30 LAB — PSA: PROSTATE SPECIFIC AG, SERUM: 3.6 ng/mL (ref 0.0–4.0)

## 2017-07-01 ENCOUNTER — Telehealth: Payer: Self-pay | Admitting: Oncology

## 2017-07-01 ENCOUNTER — Ambulatory Visit (HOSPITAL_BASED_OUTPATIENT_CLINIC_OR_DEPARTMENT_OTHER): Payer: BLUE CROSS/BLUE SHIELD | Admitting: Oncology

## 2017-07-01 VITALS — BP 120/81 | HR 77 | Temp 97.6°F | Resp 17 | Ht 68.0 in | Wt 168.8 lb

## 2017-07-01 DIAGNOSIS — C61 Malignant neoplasm of prostate: Secondary | ICD-10-CM | POA: Diagnosis not present

## 2017-07-01 DIAGNOSIS — E291 Testicular hypofunction: Secondary | ICD-10-CM

## 2017-07-01 DIAGNOSIS — C7951 Secondary malignant neoplasm of bone: Secondary | ICD-10-CM

## 2017-07-01 NOTE — Progress Notes (Signed)
Hematology and Oncology Follow Up Visit  David Huffman [id number] 1960-10-23 56 y.o. 07/01/2017 4:32 PM  CC: [name], MD    Principle Diagnosis: 56 year old gentleman with prostate cancer diagnosed in March 2008, Gleason score 7, PSA of 31.  Prior Therapy: 1. Status post prostatectomy done in 2008, pathology revealing T2c N0 with 0 out of 12 lymph nodes involved, pathology revealed 4 + 3 equals 7 Gleason score.  2. The patient continued to have rise in his PSA up to 52 without any measurable disease. 3.     Casodex 50 mg daily added in 05/2011. Therapy discontinued in March 2018 because of progression of disease.  Current therapy:  He is on hormonal deprivation, Lupron 22.5 mg every 3 months on a continuous basis started in 2008.  PSA nadir down to 0.4 and then rose up to 2.42 in 05/2011.  Zytiga 1000 mg daily with prednisone 5 mg daily Started in May 2018.   Interim History:  [name] presents today for a followup visit with his wife. Since the last visit, he reports feeling well overall.  He does have some mild fatigue which is unchanged and not interfering with his function.  He still able to work full time and exercises regularly. He continues to take Zytiga and prednisone without any other complications. He denied any nausea or edema. He denied any bone pain or pathological fractures. He denied any pelvic pain or discomfort He denied any urination difficulties.  He denies any weight loss or appetite changes.  He denies any difficulties obtaining or taking this medication regularly.  He does not report any headaches or blurry vision.does not report any fevers, chills, sweats or weight loss. Does that report any chest pain, palpitation orthopnea. Does not report any change in his bowel habits or nausea or vomiting. Did not report any neurological symptoms. Does not report any frequency urgency or hesitancy. Rest of his review of systems unremarkable.  Medications: I have reviewed the  patient's current medications. Current Outpatient Medications  Medication Sig Dispense Refill  . Ascorbic Acid (VITAMIN C) 1000 MG tablet Take 1,000 mg by mouth daily.    Marland Kitchen b complex vitamins tablet Take 1 tablet by mouth daily.    . Calcium Carbonate-Vitamin D (CALCIUM + D PO) Take 1 tablet by mouth daily.    Marland Kitchen leuprolide (LUPRON) 22.5 MG injection Inject 22.5 mg into the muscle every 3 (three) months.    . Melatonin 10 MG CAPS Take 12 mg 2 (two) times daily at 8 am and 10 pm by mouth.    . Multiple Vitamins-Minerals (MULTIVITAMIN PO) Take 1 tablet by mouth daily.    . Omega-3 Fatty Acids (FISH OIL) 1000 MG CAPS Take 1,000 mg by mouth daily.    . predniSONE (DELTASONE) 5 MG tablet Take 1 tablet (5 mg total) by mouth daily with breakfast. 90 tablet 3  . VITAMIN D, ERGOCALCIFEROL, PO Take 1 tablet by mouth daily.    Marland Kitchen ZYTIGA 250 MG tablet TAKE 4 TABLETS BY MOUTH DAILY ON AN EMPTY STOMACH 1 HOUR BEFORE OR 2 HOURS AFTER A MEAL 120 tablet 0   No current facility-administered medications for this visit.     Allergies: No Known Allergies  Past Medical History, Surgical history, Social history, and Family History were reviewed and updated.   Physical Exam: Blood pressure 120/81, pulse 77, temperature 97.6 F (36.4 C), temperature source Oral, resp. rate 17, height 5\' 8"  (1.727 m), weight 168 lb 12.8 oz (76.6 kg), SpO2  100 %. ECOG: 0 General appearance: Alert, awake gentleman without distress. Head: Normocephalic, without obvious abnormality no oral ulcers or lesions. Neck: no adenopathy Lymph nodes: Cervical, supraclavicular, and axillary nodes normal. Heart:regular rate and rhythm, S1, S2 normal, no murmur, click, rub or gallop Lung:chest clear, no wheezing, rales, normal symmetric air entry Abdomin: soft, non-tender, without masses or organomegaly. No shifting dullness or ascites. Perirectal examination showed no abscesses or masses. EXT:no erythema, induration, or nodules   Lab  Results: Lab Results  Component Value Date   WBC 5.1 06/29/2017   HGB 14.3 06/29/2017   HCT 42.3 06/29/2017   MCV 88.5 06/29/2017   PLT 208 06/29/2017     Chemistry      Component Value Date/Time   NA 142 06/29/2017 0757   K 4.5 06/29/2017 0757   CL 106 01/03/2013 0916   CO2 27 06/29/2017 0757   BUN 15.8 06/29/2017 0757   CREATININE 0.9 06/29/2017 0757      Component Value Date/Time   CALCIUM 9.6 06/29/2017 0757   ALKPHOS 89 06/29/2017 0757   AST 38 (H) 06/29/2017 0757   ALT 48 06/29/2017 0757   BILITOT 1.48 (H) 06/29/2017 0757       Results for David Huffman (MRN [id number]) as of 07/01/2017 16:03  Ref. Range 05/19/2017 07:48 06/29/2017 07:57  Prostate Specific Ag, Serum Latest Ref Range: 0.0 - 4.0 ng/mL 4.3 (H) 3.6       ASSESSMENT AND PLAN:   56 year old gentleman with the following issues:  1. Castration resistant metastatic prostate cancer with disease to the bone. His initial diagnosis was in March 2008 and has developed biochemical relapse immediately after his prostatectomy. He was treated with androgen deprivation therapy only for the last 10 years.  MRI of the hip on 12/04/2016 showed metastatic lesions in the right pubic body and the right inferior superior pubic rami.   He is currently receiving Zytiga and prednisone.  His PSA showed slight decline in the last 6 weeks currently at 3.6.  Risks and benefits of continuing this medication was reviewed today and is agreeable to continue.   2. Androgen depravation: He'll continue on Lupron every 3 months. He will receive  injection on 05/21/2017.  This will be repeated with the next visit.  3. Bone health: He'll continue on calcium and vitamin D. We discussed the risks and benefits of Xgeva today given his recent bone metastasis. Complications associated with this medication includes fatigue, flulike symptoms, hypocalcemia and osteonecrosis of the jaw. The time being we elected to defer this treatment to a later  date.  4. Follow-up: Will be in December 2018 for a follow-up and Lupron injection.   Zola Button, MD 11/8/20184:32 PM

## 2017-07-01 NOTE — Telephone Encounter (Signed)
Scheduled appt per 11/18 los - Gave patient AVS and calender per los.

## 2017-07-05 ENCOUNTER — Other Ambulatory Visit: Payer: Self-pay | Admitting: Oncology

## 2017-07-05 DIAGNOSIS — C61 Malignant neoplasm of prostate: Secondary | ICD-10-CM

## 2017-07-20 MED FILL — ZYTIGA 250 MG TABLET: 250 | 30 days supply | Qty: 120 | Fill #0

## 2017-08-13 ENCOUNTER — Other Ambulatory Visit: Payer: Self-pay | Admitting: Oncology

## 2017-08-13 DIAGNOSIS — C61 Malignant neoplasm of prostate: Secondary | ICD-10-CM

## 2017-08-18 ENCOUNTER — Other Ambulatory Visit (HOSPITAL_BASED_OUTPATIENT_CLINIC_OR_DEPARTMENT_OTHER): Payer: BLUE CROSS/BLUE SHIELD

## 2017-08-18 DIAGNOSIS — C61 Malignant neoplasm of prostate: Secondary | ICD-10-CM | POA: Diagnosis not present

## 2017-08-18 LAB — CBC WITH DIFFERENTIAL/PLATELET
BASO%: 0.8 % (ref 0.0–2.0)
Basophils Absolute: 0 10*3/uL (ref 0.0–0.1)
EOS ABS: 0.2 10*3/uL (ref 0.0–0.5)
EOS%: 3.4 % (ref 0.0–7.0)
HEMATOCRIT: 43.8 % (ref 38.4–49.9)
HEMOGLOBIN: 15 g/dL (ref 13.0–17.1)
LYMPH%: 38.8 % (ref 14.0–49.0)
MCH: 29.8 pg (ref 27.2–33.4)
MCHC: 34.3 g/dL (ref 32.0–36.0)
MCV: 87 fL (ref 79.3–98.0)
MONO#: 0.6 10*3/uL (ref 0.1–0.9)
MONO%: 9.3 % (ref 0.0–14.0)
NEUT%: 47.7 % (ref 39.0–75.0)
NEUTROS ABS: 2.9 10*3/uL (ref 1.5–6.5)
PLATELETS: 234 10*3/uL (ref 140–400)
RBC: 5.04 10*6/uL (ref 4.20–5.82)
RDW: 12.7 % (ref 11.0–14.6)
WBC: 6 10*3/uL (ref 4.0–10.3)
lymph#: 2.3 10*3/uL (ref 0.9–3.3)

## 2017-08-18 LAB — COMPREHENSIVE METABOLIC PANEL
ALBUMIN: 3.9 g/dL (ref 3.5–5.0)
ALK PHOS: 106 U/L (ref 40–150)
ALT: 39 U/L (ref 0–55)
ANION GAP: 9 meq/L (ref 3–11)
AST: 31 U/L (ref 5–34)
BILIRUBIN TOTAL: 1.26 mg/dL — AB (ref 0.20–1.20)
BUN: 14 mg/dL (ref 7.0–26.0)
CALCIUM: 9.5 mg/dL (ref 8.4–10.4)
CO2: 25 mEq/L (ref 22–29)
Chloride: 108 mEq/L (ref 98–109)
Creatinine: 1 mg/dL (ref 0.7–1.3)
GLUCOSE: 94 mg/dL (ref 70–140)
Potassium: 4.2 mEq/L (ref 3.5–5.1)
Sodium: 142 mEq/L (ref 136–145)
TOTAL PROTEIN: 6.9 g/dL (ref 6.4–8.3)

## 2017-08-19 LAB — PSA: Prostate Specific Ag, Serum: 3.7 ng/mL (ref 0.0–4.0)

## 2017-08-19 MED FILL — ABIRATERONE ACETATE 250 MG: 250 | 30 days supply | Qty: 120 | Fill #0

## 2017-08-20 ENCOUNTER — Ambulatory Visit (HOSPITAL_BASED_OUTPATIENT_CLINIC_OR_DEPARTMENT_OTHER): Payer: BLUE CROSS/BLUE SHIELD | Admitting: Oncology

## 2017-08-20 ENCOUNTER — Telehealth: Payer: Self-pay | Admitting: Oncology

## 2017-08-20 ENCOUNTER — Ambulatory Visit (HOSPITAL_BASED_OUTPATIENT_CLINIC_OR_DEPARTMENT_OTHER): Payer: BLUE CROSS/BLUE SHIELD

## 2017-08-20 VITALS — BP 128/86 | HR 70 | Temp 98.0°F | Resp 18 | Ht 68.0 in | Wt 165.0 lb

## 2017-08-20 DIAGNOSIS — E291 Testicular hypofunction: Secondary | ICD-10-CM | POA: Diagnosis not present

## 2017-08-20 DIAGNOSIS — C7951 Secondary malignant neoplasm of bone: Secondary | ICD-10-CM

## 2017-08-20 DIAGNOSIS — C61 Malignant neoplasm of prostate: Secondary | ICD-10-CM | POA: Diagnosis not present

## 2017-08-20 DIAGNOSIS — Z5111 Encounter for antineoplastic chemotherapy: Secondary | ICD-10-CM | POA: Diagnosis not present

## 2017-08-20 MED ORDER — LEUPROLIDE ACETATE (3 MONTH) 22.5 MG IM KIT
22.5000 mg | PACK | Freq: Once | INTRAMUSCULAR | Status: AC
  Administered 2017-08-20: 22.5 mg via INTRAMUSCULAR
  Filled 2017-08-20: qty 22.5

## 2017-08-20 MED ORDER — LEUPROLIDE ACETATE (4 MONTH) 30 MG IM KIT: 22.5000 mg | PACK | Freq: Once | INTRAMUSCULAR | Status: DC

## 2017-08-20 NOTE — Patient Instructions (Signed)
Leuprolide depot injection What is this medicine? LEUPROLIDE (loo PROE lide) is a man-made protein that acts like a natural hormone in the body. It decreases testosterone in men and decreases estrogen in women. In men, this medicine is used to treat advanced prostate cancer. In women, some forms of this medicine may be used to treat endometriosis, uterine fibroids, or other male hormone-related problems. This medicine may be used for other purposes; ask your health care provider or pharmacist if you have questions. What should I tell my health care provider before I take this medicine? They need to know if you have any of these conditions: -diabetes -heart disease or previous heart attack -high blood pressure -high cholesterol -osteoporosis -pain or difficulty passing urine -spinal cord metastasis -stroke -tobacco smoker -unusual vaginal bleeding (women) -an unusual or allergic reaction to leuprolide, benzyl alcohol, other medicines, foods, dyes, or preservatives -pregnant or trying to get pregnant -breast-feeding How should I use this medicine? This medicine is for injection into a muscle or for injection under the skin. It is given by a health care professional in a hospital or clinic setting. The specific product will determine how it will be given to you. Make sure you understand which product you receive and how often you will receive it. Talk to your pediatrician regarding the use of this medicine in children. Special care may be needed. Overdosage: If you think you have taken too much of this medicine contact a poison control center or emergency room at once. NOTE: This medicine is only for you. Do not share this medicine with others. What if I miss a dose? It is important not to miss a dose. Call your doctor or health care professional if you are unable to keep an appointment. Depot injections: Depot injections are given either once-monthly, every 12 weeks, every 16 weeks, or  every 24 weeks depending on the product you are prescribed. The product you are prescribed will be based on if you are male or male, and your condition. Make sure you understand your product and dosing. What may interact with this medicine? Do not take this medicine with any of the following medications: -chasteberry This medicine may also interact with the following medications: -herbal or dietary supplements, like black cohosh or DHEA -male hormones, like estrogens or progestins and birth control pills, patches, rings, or injections -male hormones, like testosterone This list may not describe all possible interactions. Give your health care provider a list of all the medicines, herbs, non-prescription drugs, or dietary supplements you use. Also tell them if you smoke, drink alcohol, or use illegal drugs. Some items may interact with your medicine. What should I watch for while using this medicine? Visit your doctor or health care professional for regular checks on your progress. During the first weeks of treatment, your symptoms may get worse, but then will improve as you continue your treatment. You may get hot flashes, increased bone pain, increased difficulty passing urine, or an aggravation of nerve symptoms. Discuss these effects with your doctor or health care professional, some of them may improve with continued use of this medicine. Male patients may experience a menstrual cycle or spotting during the first months of therapy with this medicine. If this continues, contact your doctor or health care professional. What side effects may I notice from receiving this medicine? Side effects that you should report to your doctor or health care professional as soon as possible: -allergic reactions like skin rash, itching or hives, swelling of the   face, lips, or tongue -breathing problems -chest pain -depression or memory disorders -pain in your legs or groin -pain at site where injected or  implanted -severe headache -swelling of the feet and legs -visual changes -vomiting Side effects that usually do not require medical attention (report to your doctor or health care professional if they continue or are bothersome): -breast swelling or tenderness -decrease in sex drive or performance -diarrhea -hot flashes -loss of appetite -muscle, joint, or bone pains -nausea -redness or irritation at site where injected or implanted -skin problems or acne This list may not describe all possible side effects. Call your doctor for medical advice about side effects. You may report side effects to FDA at 1-800-FDA-1088. Where should I keep my medicine? This drug is given in a hospital or clinic and will not be stored at home. NOTE: This sheet is a summary. It may not cover all possible information. If you have questions about this medicine, talk to your doctor, pharmacist, or health care provider.    2016, Elsevier/Gold Standard. (2014-05-04 14:16:23)  

## 2017-08-20 NOTE — Telephone Encounter (Signed)
Gave patient avs and calendar with appts per 12/28 los °

## 2017-08-20 NOTE — Progress Notes (Signed)
Hematology and Oncology Follow Up Visit  David Huffman [id number] [birth year] 56 y.o. 08/20/2017 9:53 AM  CC: [name], MD    Principle Diagnosis: 56 year old gentleman with prostate cancer diagnosed in March 2008, Gleason score 7, PSA of 31.  Prior Therapy: 1. Status post prostatectomy done in 2008, pathology revealing T2c N0 with 0 out of 12 lymph nodes involved, pathology revealed 4 + 3 equals 7 Gleason score.  2. The patient continued to have rise in his PSA up to 52 without any measurable disease. 3.     Casodex 50 mg daily added in 05/2011. Therapy discontinued in March 2018 because of progression of disease.  Current therapy:  He is on hormonal deprivation, Lupron 22.5 mg every 3 months on a continuous basis started in 2008.  PSA nadir down to 0.4 and then rose up to 2.42 in 05/2011.  Zytiga 1000 mg daily with prednisone 5 mg daily Started in May 2018.   Interim History:  David Huffman presents today for a followup visit with his wife. Since the last visit, he reports no changes in his health. He continues to take Zytiga and prednisone without any other complications. He denied any nausea or edema. He denied any bone pain or pathological fractures. He denied any pelvic pain or discomfort He denied any urination difficulties.  His performance status and activity level is unchanged.  Continues to work full-time without any decline in ability to do so.  His appetite remains reasonable although he lost weight a lot of it has to do with his busy schedule.  He denies any difficulties obtaining or taking this medication regularly.  He does not report any headaches or blurry vision.does not report any fevers, chills, sweats or weight loss. Does that report any chest pain, palpitation orthopnea. Does not report any change in his bowel habits or nausea or vomiting. Did not report any neurological symptoms. Does not report any frequency urgency or hesitancy. Rest of his review of systems  unremarkable.  Medications: I have reviewed the patient's current medications. Current Outpatient Medications  Medication Sig Dispense Refill  . Ascorbic Acid (VITAMIN C) 1000 MG tablet Take 1,000 mg by mouth daily.    Marland Kitchen b complex vitamins tablet Take 1 tablet by mouth daily.    . Calcium Carbonate-Vitamin D (CALCIUM + D PO) Take 1 tablet by mouth daily.    Marland Kitchen leuprolide (LUPRON) 22.5 MG injection Inject 22.5 mg into the muscle every 3 (three) months.    . Melatonin 10 MG CAPS Take 12 mg 2 (two) times daily at 8 am and 10 pm by mouth.    . Multiple Vitamins-Minerals (MULTIVITAMIN PO) Take 1 tablet by mouth daily.    . Omega-3 Fatty Acids (FISH OIL) 1000 MG CAPS Take 1,000 mg by mouth daily.    . predniSONE (DELTASONE) 5 MG tablet Take 1 tablet (5 mg total) by mouth daily with breakfast. 90 tablet 3  . VITAMIN D, ERGOCALCIFEROL, PO Take 1 tablet by mouth daily.    Marland Kitchen ZYTIGA 250 MG tablet TAKE 4 TABLETS BY MOUTH DAILY ON AN EMPTY STOMACH 1 HOUR BEFORE OR 2 HOURS AFTER A MEAL 120 tablet 0   No current facility-administered medications for this visit.     Allergies: No Known Allergies  Past Medical History, Surgical history, Social history, and Family History were reviewed and updated.   Physical Exam: Blood pressure 128/86, pulse 70, temperature 98 F (36.7 C), temperature source Oral, resp. rate 18, height 5\' 8"  (1.727 m),  weight 165 lb (74.8 kg), SpO2 100 %. ECOG: 0 General appearance: Well-appearing gentleman without distress. Head: Normocephalic, without obvious abnormality no oral thrush or ulcers. Neck: no adenopathy Lymph nodes: Cervical, supraclavicular, and axillary nodes normal. Heart:regular rate and rhythm, S1, S2 normal, no murmur, click, rub or gallop Lung:chest clear, no wheezing, rales, normal symmetric air entry Abdomin: soft, non-tender, without masses or organomegaly. No rebound or guarding. Perirectal examination showed no abscesses or masses. EXT:no erythema,  induration, or nodules   Lab Results: Lab Results  Component Value Date   WBC 6.0 08/18/2017   HGB 15.0 08/18/2017   HCT 43.8 08/18/2017   MCV 87.0 08/18/2017   PLT 234 08/18/2017     Chemistry      Component Value Date/Time   NA 142 08/18/2017 0754   K 4.2 08/18/2017 0754   CL 106 01/03/2013 0916   CO2 25 08/18/2017 0754   BUN 14.0 08/18/2017 0754   CREATININE 1.0 08/18/2017 0754      Component Value Date/Time   CALCIUM 9.5 08/18/2017 0754   ALKPHOS 106 08/18/2017 0754   AST 31 08/18/2017 0754   ALT 39 08/18/2017 0754   BILITOT 1.26 (H) 08/18/2017 0754       Results for REXFORD, PREVO (MRN [id number]) as of 08/20/2017 09:36  Ref. Range 06/29/2017 07:57 08/18/2017 07:54  Prostate Specific Ag, Serum Latest Ref Range: 0.0 - 4.0 ng/mL 3.6 3.7        ASSESSMENT AND PLAN:   56 year old gentleman with the following issues:  1. Castration resistant metastatic prostate cancer with disease to the bone. His initial diagnosis was in March 2008 and has developed biochemical relapse immediately after his prostatectomy. He was treated with androgen deprivation therapy only for the last 10 years.  MRI of the hip on 12/04/2016 showed metastatic lesions in the right pubic body and the right inferior superior pubic rami.   He is currently receiving Zytiga and prednisone.  His PSA remains relatively stable on Zytiga currently at 3.7.  Risks and benefits of continuing this medication was discussed today and he is agreeable to continue.  The plan is to repeat imaging studies for staging purposes in February 2019.   2. Androgen depravation: He'll continue on Lupron every 3 months. He will receive  injection on 28 2018 and will be repeated in 3 months.  3. Bone health: He'll continue on calcium and vitamin D. We discussed the risks and benefits of Xgeva today given his recent bone metastasis. Complications associated with this medication includes fatigue, flulike symptoms, hypocalcemia  and osteonecrosis of the jaw.  This was deferred for later date.  4. Follow-up: Will be in February 2019 for a follow-up.   Zola Button, MD 12/28/20189:53 AM

## 2017-08-25 ENCOUNTER — Other Ambulatory Visit: Payer: Self-pay | Admitting: Pharmacist

## 2017-09-13 ENCOUNTER — Other Ambulatory Visit: Payer: Self-pay | Admitting: Oncology

## 2017-09-13 DIAGNOSIS — C61 Malignant neoplasm of prostate: Secondary | ICD-10-CM

## 2017-09-20 MED FILL — ABIRATERONE ACETATE 250 MG: 250 | 30 days supply | Qty: 120 | Fill #0

## 2017-09-21 ENCOUNTER — Other Ambulatory Visit: Payer: Self-pay | Admitting: *Deleted

## 2017-09-21 MED ORDER — PREDNISONE 5 MG PO TABS: 5.0000 mg | ORAL_TABLET | Freq: Every day | ORAL | 3 refills | Status: DC

## 2017-09-30 ENCOUNTER — Encounter (HOSPITAL_COMMUNITY)
Admission: RE | Admit: 2017-09-30 | Discharge: 2017-09-30 | Disposition: A | Discharge status: Home / Self Care | Payer: BLUE CROSS/BLUE SHIELD | Source: Ambulatory Visit | Attending: Oncology | Admitting: Oncology

## 2017-09-30 ENCOUNTER — Ambulatory Visit (HOSPITAL_COMMUNITY)
Admission: RE | Admit: 2017-09-30 | Discharge: 2017-09-30 | Disposition: A | Discharge status: Home / Self Care | Payer: BLUE CROSS/BLUE SHIELD | Source: Ambulatory Visit | Attending: Oncology | Admitting: Oncology

## 2017-09-30 ENCOUNTER — Inpatient Hospital Stay: Payer: BLUE CROSS/BLUE SHIELD | Attending: Oncology

## 2017-09-30 DIAGNOSIS — E878 Other disorders of electrolyte and fluid balance, not elsewhere classified: Secondary | ICD-10-CM | POA: Insufficient documentation

## 2017-09-30 DIAGNOSIS — C7951 Secondary malignant neoplasm of bone: Secondary | ICD-10-CM | POA: Insufficient documentation

## 2017-09-30 DIAGNOSIS — C61 Malignant neoplasm of prostate: Secondary | ICD-10-CM

## 2017-09-30 DIAGNOSIS — E291 Testicular hypofunction: Secondary | ICD-10-CM | POA: Diagnosis not present

## 2017-09-30 DIAGNOSIS — I10 Essential (primary) hypertension: Secondary | ICD-10-CM | POA: Diagnosis not present

## 2017-09-30 LAB — COMPREHENSIVE METABOLIC PANEL
ALBUMIN: 3.8 g/dL (ref 3.5–5.0)
ALT: 37 U/L (ref 0–55)
ANION GAP: 7 (ref 3–11)
AST: 33 U/L (ref 5–34)
Alkaline Phosphatase: 107 U/L (ref 40–150)
BUN: 13 mg/dL (ref 7–26)
CHLORIDE: 109 mmol/L (ref 98–109)
CO2: 26 mmol/L (ref 22–29)
Calcium: 9 mg/dL (ref 8.4–10.4)
Creatinine, Ser: 1.02 mg/dL (ref 0.70–1.30)
GFR calc Af Amer: >60 mL/min (ref >60)
GLUCOSE: 109 mg/dL (ref 70–140)
POTASSIUM: 4 mmol/L (ref 3.5–5.1)
Sodium: 142 mmol/L (ref 136–145)
Total Bilirubin: 1.6 mg/dL — ABNORMAL HIGH (ref 0.2–1.2)
Total Protein: 6.5 g/dL (ref 6.4–8.3)

## 2017-09-30 LAB — CBC WITH DIFFERENTIAL/PLATELET
BASOS ABS: 0 10*3/uL (ref 0.0–0.1)
Basophils Relative: 0 %
EOS PCT: 2 %
Eosinophils Absolute: 0.1 10*3/uL (ref 0.0–0.5)
HEMATOCRIT: 39.2 % (ref 38.4–49.9)
Hemoglobin: 13.6 g/dL (ref 13.0–17.1)
LYMPHS ABS: 2 10*3/uL (ref 0.9–3.3)
LYMPHS PCT: 36 %
MCH: 30 pg (ref 27.2–33.4)
MCHC: 34.7 g/dL (ref 32.0–36.0)
MCV: 86.5 fL (ref 79.3–98.0)
Monocytes Absolute: 0.3 10*3/uL (ref 0.1–0.9)
Monocytes Relative: 6 %
NEUTROS ABS: 3.1 10*3/uL (ref 1.5–6.5)
Neutrophils Relative %: 56 %
PLATELETS: 202 10*3/uL (ref 140–400)
RBC: 4.53 MIL/uL (ref 4.20–5.82)
RDW: 12.6 % (ref 11.0–14.6)
WBC: 5.5 10*3/uL (ref 4.0–10.3)

## 2017-09-30 MED ORDER — IOPAMIDOL (ISOVUE-300) INJECTION 61%
INTRAVENOUS | Status: AC
  Filled 2017-09-30: qty 100

## 2017-09-30 MED ORDER — TECHNETIUM TC 99M MEDRONATE IV KIT
21.0000 | PACK | Freq: Once | INTRAVENOUS | Status: AC | PRN
  Administered 2017-09-30: 21 via INTRAVENOUS

## 2017-09-30 MED ORDER — IOPAMIDOL (ISOVUE-300) INJECTION 61%
100.0000 mL | Freq: Once | INTRAVENOUS | Status: AC | PRN
  Administered 2017-09-30: 100 mL via INTRAVENOUS

## 2017-10-01 ENCOUNTER — Other Ambulatory Visit: Payer: BLUE CROSS/BLUE SHIELD

## 2017-10-01 LAB — PROSTATE-SPECIFIC AG, SERUM (LABCORP): PROSTATE SPECIFIC AG, SERUM: 2.9 ng/mL (ref 0.0–4.0)

## 2017-10-05 ENCOUNTER — Inpatient Hospital Stay (HOSPITAL_BASED_OUTPATIENT_CLINIC_OR_DEPARTMENT_OTHER): Payer: BLUE CROSS/BLUE SHIELD | Admitting: Oncology

## 2017-10-05 ENCOUNTER — Other Ambulatory Visit: Payer: Self-pay | Admitting: Pharmacist

## 2017-10-05 ENCOUNTER — Telehealth: Payer: Self-pay

## 2017-10-05 VITALS — BP 147/80 | HR 80 | Temp 97.8°F | Resp 20 | Ht 68.0 in | Wt 164.5 lb

## 2017-10-05 DIAGNOSIS — E291 Testicular hypofunction: Secondary | ICD-10-CM

## 2017-10-05 DIAGNOSIS — C7951 Secondary malignant neoplasm of bone: Secondary | ICD-10-CM | POA: Diagnosis not present

## 2017-10-05 DIAGNOSIS — E878 Other disorders of electrolyte and fluid balance, not elsewhere classified: Secondary | ICD-10-CM

## 2017-10-05 DIAGNOSIS — C61 Malignant neoplasm of prostate: Secondary | ICD-10-CM

## 2017-10-05 DIAGNOSIS — I10 Essential (primary) hypertension: Secondary | ICD-10-CM | POA: Diagnosis not present

## 2017-10-05 NOTE — Progress Notes (Signed)
Hematology and Oncology Follow Up Visit  David Huffman [id number] 06-06-1961 57 y.o. 10/05/2017 9:59 AM  CC: David Messick, MD    Principle Diagnosis: 57 year old gentleman with castration resistant metastatic prostate cancer with disease to the bone.  He was initially diagnosed in March 2008, Gleason score 7, PSA of 31.  Prior Therapy: 1. Status post prostatectomy done in 2008, pathology revealing T2c N0 with 0 out of 12 lymph nodes involved.  2. The patient continued to have rise in his PSA up to 52 without any measurable disease. 3.     Casodex 50 mg daily added in 05/2011. Therapy discontinued in March 2018 because of progression of disease.  Current therapy:  He is on hormonal deprivation, Lupron 22.5 mg every 3 months on a continuous basis started in 2008.  PSA nadir down to 0.4 and then rose up to 2.42 in 05/2011.  He developed castration resistant disease in May 2018 with right pubic bone involvement.  Zytiga 1000 mg daily with prednisone 5 mg daily Started in May 2018.   Interim History:  David Huffman is here for a follow-up with his wife.  Since last visit, he reports no major changes or complaints.  He continues to take Zytiga and has not reported any recent complications.  He denies any nausea, fatigue or edema.  He continues to take it and has not missed any doses.  He continues to exercise regularly including cycling and light jogging.  He denies any bone pain or pathological fractures.  His appetite remain excellent and his performance status is unchanged.  He denies any urinary symptoms at this time.  He denies any hot flashes or other complications related to Lupron.   He does not report any headaches or blurry vision.does not report any fevers, chills, sweats or weight loss. Does that report any chest pain, palpitation orthopnea.  He does not report any cough, wheezing or hemoptysis.  Does not report any change in his bowel habits or nausea or vomiting. Does not report any  frequency urgency or hesitancy.  He does not report any skeletal complaints of arthralgias or myalgias.  He does not report any skin rashes or lesions.  He does not report any heat or cold intolerance.  He does not report anxiety or depression.  He does not report any petechiae or easy bruising.  Rest of his review of systems is negative.  Medications: I have reviewed the patient's current medications. Current Outpatient Medications  Medication Sig Dispense Refill  . abiraterone acetate (ZYTIGA) 250 MG tablet TAKE 4 TABLETS BY MOUTH DAILY ON AN EMPTY STOMACH 1 HOUR BEFORE OR 2 HOURS AFTER A MEAL 120 tablet 0  . Ascorbic Acid (VITAMIN C) 1000 MG tablet Take 1,000 mg by mouth daily.    Marland Kitchen b complex vitamins tablet Take 1 tablet by mouth daily.    . Calcium Carbonate-Vitamin D (CALCIUM + D PO) Take 1 tablet by mouth daily.    Marland Kitchen leuprolide (LUPRON) 22.5 MG injection Inject 22.5 mg into the muscle every 3 (three) months.    . Melatonin 10 MG CAPS Take 12 mg 2 (two) times daily at 8 am and 10 pm by mouth.    . Multiple Vitamins-Minerals (MULTIVITAMIN PO) Take 1 tablet by mouth daily.    . Omega-3 Fatty Acids (FISH OIL) 1000 MG CAPS Take 1,000 mg by mouth daily.    . predniSONE (DELTASONE) 5 MG tablet Take 1 tablet (5 mg total) by mouth daily with breakfast. 90 tablet 3  .  VITAMIN D, ERGOCALCIFEROL, PO Take 1 tablet by mouth daily.     No current facility-administered medications for this visit.     Allergies: No Known Allergies  Past Medical History, Surgical history, Social history, and Family History discussed and unchanged.   Physical Exam: Blood pressure (!) 147/80, pulse 80, temperature 97.8 F (36.6 C), temperature source Oral, resp. rate 20, height 5\' 8"  (1.727 m), weight 164 lb 8 oz (74.6 kg), SpO2 100 %. ECOG: 0 General appearance: Alert, awake gentleman appeared without distress. Head: Normocephalic, without obvious abnormality  Oral mucosa: Mucous membranes are moist without thrush  or ulcers.   Eyes: No scleral icterus. Lymph nodes: Cervical, supraclavicular, and axillary nodes normal. Heart:regular rate and rhythm, S1, S2 normal, no murmur, click, rub or gallop Lung: Clear to auscultation in all lung fields without wheezes or dullness to percussion. Abdomin: Soft, nontender with good bowel sounds.  No rebound or guarding. Skin: No rashes or lesions. Musculoskeletal: No joint deformity or effusion. Neurological: No motor, sensory deficits.  Intact deep tendon reflexes.    Lab Results: Lab Results  Component Value Date   WBC 5.5 09/30/2017   HGB 13.6 09/30/2017   HCT 39.2 09/30/2017   MCV 86.5 09/30/2017   PLT 202 09/30/2017     Chemistry      Component Value Date/Time   NA 142 09/30/2017 1419   NA 142 08/18/2017 0754   K 4.0 09/30/2017 1419   K 4.2 08/18/2017 0754   CL 109 09/30/2017 1419   CL 106 01/03/2013 0916   CO2 26 09/30/2017 1419   CO2 25 08/18/2017 0754   BUN 13 09/30/2017 1419   BUN 14.0 08/18/2017 0754   CREATININE 1.02 09/30/2017 1419   CREATININE 1.0 08/18/2017 0754      Component Value Date/Time   CALCIUM 9.0 09/30/2017 1419   CALCIUM 9.5 08/18/2017 0754   ALKPHOS 107 09/30/2017 1419   ALKPHOS 106 08/18/2017 0754   AST 33 09/30/2017 1419   AST 31 08/18/2017 0754   ALT 37 09/30/2017 1419   ALT 39 08/18/2017 0754   BILITOT 1.6 (H) 09/30/2017 1419   BILITOT 1.26 (H) 08/18/2017 0754      Results for David Huffman (MRN [id number]) as of 10/05/2017 10:03  Ref. Range 06/29/2017 07:57 08/18/2017 07:54 09/30/2017 14:18  Prostate Specific Ag, Serum Latest Ref Range: 0.0 - 4.0 ng/mL 3.6 3.7 2.9    EXAM: CT ABDOMEN AND PELVIS WITH CONTRAST  TECHNIQUE: Multidetector CT imaging of the abdomen and pelvis was performed using the standard protocol following bolus administration of intravenous contrast.  CONTRAST:  170mL ISOVUE-300 IOPAMIDOL (ISOVUE-300) INJECTION 61%  COMPARISON:  CT on 11/18/2016  FINDINGS: Lower Chest: No acute  findings.  Hepatobiliary: No hepatic masses identified. Tiny sub-cm cyst in left hepatic lobe remains stable. Gallbladder is unremarkable.  Pancreas:  No mass or inflammatory changes.  Spleen: Within normal limits in size and appearance.  Adrenals/Urinary Tract: No masses identified. No evidence of hydronephrosis.  Stomach/Bowel: No evidence of obstruction, inflammatory process or abnormal fluid collections. Normal appendix visualized.  Vascular/Lymphatic: No pathologically enlarged lymph nodes. No abdominal aortic aneurysm.  Reproductive: Prior prostatectomy. No evidence of recurrent mass within the prostatectomy bed.  Other:  None.  Musculoskeletal: Mixed lytic and sclerotic bone lesion is again seen involving the right pubis and inferior pubic ramus, without significant change. No other suspicious bone lesions identified.  IMPRESSION: No evidence of soft tissue metastatic disease within the abdomen or pelvis.  Stable appearance of mixed  lytic and sclerotic bone metastasis involving the right pubis and inferior pubic ramus.   EXAM: NUCLEAR MEDICINE WHOLE BODY BONE SCAN  TECHNIQUE: Whole body anterior and posterior images were obtained approximately 3 hours after intravenous injection of radiopharmaceutical.  RADIOPHARMACEUTICALS:  21.0 mCi Technetium-68m MDP IV  COMPARISON:  Whole-body bone scan 11/05/2015 and 11/18/2016. CT abdomen and pelvis 11/18/2016, 09/30/2017 and 11/05/2015.  FINDINGS: Focus of increased uptake in the right pubic bone is unchanged. Focus of increased uptake in the inferior aspect of the right sacrum seen on the most recent bone scan has resolved most consistent with fracture healing. No new focus of abnormal uptake is identified. Findings consistent with mild degenerative disease about the acromioclavicular joints and right hindfoot are noted. Soft tissue uptake is unremarkable.  IMPRESSION: No new abnormality since  the prior bone scans.  Uptake in the right pubic bone at the site of a lytic and sclerotic bone lesion is stable in appearance.      ASSESSMENT AND PLAN:   57 year old gentleman with the following issues:  1. Castration resistant metastatic prostate cancer with disease to the bone documented in April 2018.   MRI of the hip on 12/04/2016 showed metastatic lesions in the right pubic body and the right inferior superior pubic rami.   He is currently receiving Zytiga and prednisone in addition to androgen deprivation therapy that has been receiving since 2008.  His PSA obtained on September 30, 2017 showed excellent response to therapy and currently at 2.9.  CT scan and bone scan also obtained at the time were personally reviewed and discussed with the patient.  The natural course of this disease was also reviewed as well as treatment options.  His disease continues to be under excellent control with Lupron and Zytiga.  Risks and benefits of continuing these medication were reviewed today he is agreeable to continue.   2. Androgen depravation: Risks and benefits of continuing Lupron long-term was reviewed.  These complications include weight gain, hot flashes, osteoporosis among others were discussed and he is agreeable to continue.  3. Bone health: He is at risk of developing skeletal related events such as bone pain and pathological fractures.  Extent benefits associated with Delton See reviewed again including complications like hypocalcemia and osteonecrosis of the jaw.  We will continue to defer that option given his limited bone disease at this time.  He will continue calcium and vitamin D supplements.  4.  Hypertension: His blood pressure appears adequate and cold continue to monitor on Zytiga.  5.  Electrolyte imbalance: His potassium is normal and will continue to monitor on Zytiga.  6. Follow-up: Will be in 6 weeks for a follow-up and Lupron injection.  25  minutes was spent with  the patient face-to-face today.  More than 50% of time was dedicated to patient counseling, education and coordination of his multifaceted care.    Zola Button, MD 2/12/20199:59 AM

## 2017-10-05 NOTE — Telephone Encounter (Signed)
Printed avs and calender for upcoming appointment

## 2017-10-11 ENCOUNTER — Other Ambulatory Visit: Payer: Self-pay | Admitting: Oncology

## 2017-10-11 DIAGNOSIS — C61 Malignant neoplasm of prostate: Secondary | ICD-10-CM

## 2017-10-15 MED FILL — ABIRATERONE ACETATE 250 MG: 250 | 30 days supply | Qty: 120 | Fill #0

## 2017-11-08 ENCOUNTER — Other Ambulatory Visit: Payer: Self-pay | Admitting: Oncology

## 2017-11-08 DIAGNOSIS — C61 Malignant neoplasm of prostate: Secondary | ICD-10-CM

## 2017-11-16 ENCOUNTER — Inpatient Hospital Stay: Payer: BLUE CROSS/BLUE SHIELD | Attending: Oncology

## 2017-11-16 DIAGNOSIS — C7951 Secondary malignant neoplasm of bone: Secondary | ICD-10-CM | POA: Insufficient documentation

## 2017-11-16 DIAGNOSIS — Z79899 Other long term (current) drug therapy: Secondary | ICD-10-CM | POA: Insufficient documentation

## 2017-11-16 DIAGNOSIS — C61 Malignant neoplasm of prostate: Secondary | ICD-10-CM | POA: Diagnosis not present

## 2017-11-16 DIAGNOSIS — Z5111 Encounter for antineoplastic chemotherapy: Secondary | ICD-10-CM | POA: Insufficient documentation

## 2017-11-16 DIAGNOSIS — I10 Essential (primary) hypertension: Secondary | ICD-10-CM | POA: Insufficient documentation

## 2017-11-16 LAB — CMP (CANCER CENTER ONLY)
ALT: 32 U/L (ref 0–55)
AST: 30 U/L (ref 5–34)
Albumin: 4.1 g/dL (ref 3.5–5.0)
Alkaline Phosphatase: 110 U/L (ref 40–150)
Anion gap: 9 (ref 3–11)
BILIRUBIN TOTAL: 1.7 mg/dL — AB (ref 0.2–1.2)
BUN: 15 mg/dL (ref 7–26)
CHLORIDE: 108 mmol/L (ref 98–109)
CO2: 26 mmol/L (ref 22–29)
CREATININE: 0.95 mg/dL (ref 0.70–1.30)
Calcium: 9.9 mg/dL (ref 8.4–10.4)
GFR, Est AFR Am: >60 mL/min (ref >60)
Glucose, Bld: 62 mg/dL — ABNORMAL LOW (ref 70–140)
Potassium: 4.2 mmol/L (ref 3.5–5.1)
Sodium: 143 mmol/L (ref 136–145)
TOTAL PROTEIN: 7.1 g/dL (ref 6.4–8.3)

## 2017-11-16 LAB — CBC WITH DIFFERENTIAL (CANCER CENTER ONLY)
BASOS ABS: 0 10*3/uL (ref 0.0–0.1)
Basophils Relative: 1 %
Eosinophils Absolute: 0.1 10*3/uL (ref 0.0–0.5)
Eosinophils Relative: 3 %
HEMATOCRIT: 44 % (ref 38.4–49.9)
HEMOGLOBIN: 15.1 g/dL (ref 13.0–17.1)
LYMPHS PCT: 43 %
Lymphs Abs: 1.9 10*3/uL (ref 0.9–3.3)
MCH: 29.9 pg (ref 27.2–33.4)
MCHC: 34.3 g/dL (ref 32.0–36.0)
MCV: 87.4 fL (ref 79.3–98.0)
Monocytes Absolute: 0.4 10*3/uL (ref 0.1–0.9)
Monocytes Relative: 8 %
NEUTROS ABS: 2.1 10*3/uL (ref 1.5–6.5)
Neutrophils Relative %: 45 %
Platelet Count: 211 10*3/uL (ref 140–400)
RBC: 5.03 MIL/uL (ref 4.20–5.82)
RDW: 13.3 % (ref 11.0–14.6)
WBC: 4.6 10*3/uL (ref 4.0–10.3)

## 2017-11-16 MED FILL — ABIRATERONE ACETATE 250 MG: 250 | 30 days supply | Qty: 120 | Fill #0

## 2017-11-17 LAB — PROSTATE-SPECIFIC AG, SERUM (LABCORP): PROSTATE SPECIFIC AG, SERUM: 3.7 ng/mL (ref 0.0–4.0)

## 2017-11-18 ENCOUNTER — Inpatient Hospital Stay (HOSPITAL_BASED_OUTPATIENT_CLINIC_OR_DEPARTMENT_OTHER): Payer: BLUE CROSS/BLUE SHIELD | Admitting: Oncology

## 2017-11-18 ENCOUNTER — Ambulatory Visit: Payer: BLUE CROSS/BLUE SHIELD

## 2017-11-18 ENCOUNTER — Ambulatory Visit: Payer: BLUE CROSS/BLUE SHIELD | Admitting: Oncology

## 2017-11-18 ENCOUNTER — Inpatient Hospital Stay: Payer: BLUE CROSS/BLUE SHIELD

## 2017-11-18 ENCOUNTER — Telehealth: Payer: Self-pay | Admitting: Oncology

## 2017-11-18 ENCOUNTER — Encounter: Payer: Self-pay | Admitting: Radiation Oncology

## 2017-11-18 VITALS — BP 136/81 | HR 57 | Temp 97.6°F | Resp 18 | Ht 68.0 in | Wt 165.2 lb

## 2017-11-18 DIAGNOSIS — I10 Essential (primary) hypertension: Secondary | ICD-10-CM

## 2017-11-18 DIAGNOSIS — C61 Malignant neoplasm of prostate: Secondary | ICD-10-CM

## 2017-11-18 DIAGNOSIS — E291 Testicular hypofunction: Secondary | ICD-10-CM

## 2017-11-18 DIAGNOSIS — C7951 Secondary malignant neoplasm of bone: Secondary | ICD-10-CM | POA: Diagnosis not present

## 2017-11-18 MED ORDER — LEUPROLIDE ACETATE (3 MONTH) 22.5 MG IM KIT
22.5000 mg | PACK | Freq: Once | INTRAMUSCULAR | Status: AC
  Administered 2017-11-18: 22.5 mg via INTRAMUSCULAR

## 2017-11-18 NOTE — Telephone Encounter (Signed)
Appointments scheduled AVS/Calendar printed per 3/28 los °

## 2017-11-18 NOTE — Progress Notes (Signed)
Hematology and Oncology Follow Up Visit  David Huffman [id number] 01/23/1961 57 y.o. 11/18/2017 11:57 AM  CC: [name], MD    Principle Diagnosis: 57 year old man with castration resistant metastatic prostate cancer.  He was initially diagnosed in 2008 with a Gleason score of 7 and a PSA of 31.  And developed castration resistant disease to the bone in May 2018   Prior Therapy: 1. Status post prostatectomy done in 2008, pathology revealing T2c N0 with 0 out of 12 lymph nodes involved.  2. The patient continued to have rise in his PSA up to 52 after prostatectomy without any measurable disease.  Lupron was started at that time. 3.     Casodex 50 mg daily added in 05/2011. Therapy discontinued in March 2018 because of progression of disease.  Current therapy:  He is on hormonal deprivation, Lupron 22.5 mg every 3 months on a continuous basis started in 2008.    Zytiga 1000 mg daily with prednisone 5 mg daily Started in May 2018 after developing castration resistant disease with pelvic bone involvement.   Interim History:  David Huffman presents today for a follow-up.  He reports no major changes since the last visit.  He remains in excellent health and shape and feeling well overall.  He continues to take Zytiga and prednisone without any new complications.  He denies any nausea, fatigue or edema.  He continues to exercise regularly and works full-time.  He denies any urination issues including hematuria or dysuria.  He denies any bone pain including pelvic discomfort or instability.   He does not report any headaches or blurry vision.he denied  fevers, chills, sweats or weight loss. Does that report any chest pain, palpitation orthopnea.  He does not report any cough, wheezing or hemoptysis.  Does not report any change in his bowel habits or nausea or vomiting. Does not report any frequency urgency or hesitancy.  He does not report any skeletal complaints of arthralgias or myalgias.  He does not  report any skin rashes or lesions.  He does not report any heat or cold intolerance.  He does not report anxiety or depression.  He does not report any ecchymosis or petechiae.  He denied any lymphadenopathy.  Rest of his review of systems is negative.  Medications: I have reviewed the patient's current medications. Current Outpatient Medications  Medication Sig Dispense Refill  . abiraterone acetate (ZYTIGA) 250 MG tablet TAKE 4 TABLETS BY MOUTH DAILY ON AN EMPTY STOMACH 1 HOUR BEFORE OR 2 HOURS AFTER A MEAL 120 tablet 0  . Ascorbic Acid (VITAMIN C) 1000 MG tablet Take 1,000 mg by mouth daily.    Marland Kitchen b complex vitamins tablet Take 1 tablet by mouth daily.    . Calcium Carbonate-Vitamin D (CALCIUM + D PO) Take 1 tablet by mouth daily.    Marland Kitchen leuprolide (LUPRON) 22.5 MG injection Inject 22.5 mg into the muscle every 3 (three) months.    . Melatonin 10 MG CAPS Take 12 mg 2 (two) times daily at 8 am and 10 pm by mouth.    . Multiple Vitamins-Minerals (MULTIVITAMIN PO) Take 1 tablet by mouth daily.    . Omega-3 Fatty Acids (FISH OIL) 1000 MG CAPS Take 1,000 mg by mouth daily.    . predniSONE (DELTASONE) 5 MG tablet Take 1 tablet (5 mg total) by mouth daily with breakfast. 90 tablet 3  . VITAMIN D, ERGOCALCIFEROL, PO Take 1 tablet by mouth daily.     No current facility-administered medications  for this visit.     Allergies: No Known Allergies  Past Medical History, Surgical history, Social history, and Family History updated without any changes today.  Physical Exam: Blood pressure 136/81, pulse (!) 57, temperature 97.6 F (36.4 C), temperature source Oral, resp. rate 18, height 5\' 8"  (1.727 m), weight 165 lb 3.2 oz (74.9 kg), SpO2 100 %.   ECOG: 0 General appearance: Well-appearing gentleman without distress. Head: Atraumatic without abnormalities. Oral mucosa: No oral thrush or ulcers. Eyes: Pupils are equal and round reactive to light. Lymph nodes: No lymphadenopathy palpated in the  cervical, axillary or supraclavicular regions. Heart: Regular rate and rhythm without any murmurs rubs or gallops. Lung: Clear without any rhonchi, wheezes or dullness to percussion. Abdomin: Soft, nontender without any rebound or guarding.  Good bowel sounds. Skin: No ecchymosis or petechiae. Musculoskeletal: Full range of motion noted in all joints. Neurological: No deficits noted in the sensorimotor exam.  Intact deep tendon reflexes    Lab Results: Lab Results  Component Value Date   WBC 4.6 11/16/2017   HGB 13.6 09/30/2017   HCT 44.0 11/16/2017   MCV 87.4 11/16/2017   PLT 211 11/16/2017     Chemistry      Component Value Date/Time   NA 143 11/16/2017 0832   NA 142 08/18/2017 0754   K 4.2 11/16/2017 0832   K 4.2 08/18/2017 0754   CL 108 11/16/2017 0832   CL 106 01/03/2013 0916   CO2 26 11/16/2017 0832   CO2 25 08/18/2017 0754   BUN 15 11/16/2017 0832   BUN 14.0 08/18/2017 0754   CREATININE 0.95 11/16/2017 0832   CREATININE 1.0 08/18/2017 0754      Component Value Date/Time   CALCIUM 9.9 11/16/2017 0832   CALCIUM 9.5 08/18/2017 0754   ALKPHOS 110 11/16/2017 0832   ALKPHOS 106 08/18/2017 0754   AST 30 11/16/2017 0832   AST 31 08/18/2017 0754   ALT 32 11/16/2017 0832   ALT 39 08/18/2017 0754   BILITOT 1.7 (H) 11/16/2017 0832   BILITOT 1.26 (H) 08/18/2017 0754      Results for SRIYAN, CUTTING (MRN [id number]) as of 11/18/2017 12:33  Ref. Range 09/30/2017 14:18 11/16/2017 08:32  Prostate Specific Ag, Serum Latest Ref Range: 0.0 - 4.0 ng/mL 2.9 3.7      ASSESSMENT AND PLAN:   57 year old gentleman with the following issues:  1. Castration resistant metastatic prostate cancer with disease to the pelvis.  The dominant lesion documented an MRI in April 2018 in the right pubic body.  MRI of the hip on 12/04/2016 showed metastatic lesions in the right pubic body and the right inferior superior pubic rami.   He is currently on Zytiga and prednisone with his PSA  continues to be relatively stable.  After decline in his PSA from  5.2 to 2.9 is currently at 3.7.  Imaging studies obtained in February 2019 showed no new areas of metastasis.  Risks and benefits of continuing Zytiga at this time was reviewed.  He has had no complications related to this treatment and at the same time his disease has been relatively stable by imaging criteria and PSA criteria.  The plan is to continue with the same dose and schedule without any dose reduction or delay.  2.  Right hip metastatic lesion: It is asymptomatic at this time but it is concerning that this lesion has persisted despite treatment with Zytiga and PSA is recently rising.  The role of ablative radiation therapy was discussed  today in detail.  I am in favor of treating this area locally given the fact that he has isolated metastasis and his disease has been under reasonable control otherwise.  The rationale and risks and benefits associated with radiation therapy was discussed today.  After discussion today, he is agreeable to have a radiation oncology consultation for consideration for ablative therapy to his tumor in the right hip.   3. Androgen depravation: He will receive Lupron today and continue to be repeated every 3 months.  No complications related to this medication noted.  4. Bone health: He is currently on calcium and vitamin D.  We will continue to monitor bone density in consider bone directed therapy with Xgeva.  5.  Hypertension: His blood pressure remain within normal range.  We will continue to monitor on Zytiga.  6. Follow-up: Will be in 6 weeks for a follow-up.   25  minutes was spent with the patient face-to-face today.  More than 50% of time was dedicated to patient counseling, education and answering questions regarding diagnosis, prognosis and different plans of care.    Zola Button, MD 3/28/201911:57 AM

## 2017-11-26 NOTE — Progress Notes (Signed)
Histology and Location of Primary Cancer: castration resistant metastatic prostate cancer. Reports prostate cancer was discovered by an abnormal DRE done by PCP at yearly physical in 2008.   Sites of Visceral and Bony Metastatic Disease: bone  Location(s) of Symptomatic Metastases: right hip  Past/Anticipated chemotherapy by medical oncology, if any:  Prior Therapy: 1. Status post prostatectomy done in 2008, pathology revealing T2c N0 with 0 out of 12 lymph nodes involved.  2. The patient continued to have rise in his PSA up to 52 after prostatectomy without any measurable disease.  Lupron was started at that time. 3.     Casodex 50 mg daily added in 05/2011. Therapy discontinued in March 2018 because of progression of disease.  Current therapy:  He is on hormonal deprivation, Lupron 22.5 mg every 3 months on a continuous basis started in 2008.  Received last Lupron injection 11/18/2017.   Zytiga 1000 mg daily with prednisone 5 mg daily Started in May 2018 after developing castration resistant disease with pelvic bone involvement   Pain on a scale of 0-10 is:  Right hip metastatic lesion is asymptomatic at this time. Denies pain.    If Spine Met(s), symptoms, if any, include:  Bowel/Bladder retention or incontinence (please describe): no  Numbness or weakness in extremities (please describe): no  Current Decadron regimen, if applicable: Zytiga with prednisone 5 mg  Ambulatory status? Walker? Wheelchair?: Ambulatory  SAFETY ISSUES:  Prior radiation? no  Pacemaker/ICD? no  Possible current pregnancy? no  Is the patient on methotrexate? no  Current Complaints / other details:  57 year old male. Referred for ablative radiation therapy to right hip lesion. Works for [employer]. Very active running and riding mountain bikes.

## 2017-11-29 ENCOUNTER — Encounter: Payer: Self-pay | Admitting: Radiation Oncology

## 2017-11-29 ENCOUNTER — Other Ambulatory Visit: Payer: Self-pay

## 2017-11-29 ENCOUNTER — Ambulatory Visit
Admission: RE | Admit: 2017-11-29 | Discharge: 2017-11-29 | Disposition: A | Discharge status: Home / Self Care | Payer: BLUE CROSS/BLUE SHIELD | Source: Ambulatory Visit | Attending: Radiation Oncology | Admitting: Radiation Oncology

## 2017-11-29 VITALS — BP 132/77 | HR 66 | Temp 98.0°F | Resp 18 | Wt 162.8 lb

## 2017-11-29 DIAGNOSIS — C7951 Secondary malignant neoplasm of bone: Secondary | ICD-10-CM

## 2017-11-29 DIAGNOSIS — Z192 Hormone resistant malignancy status: Secondary | ICD-10-CM | POA: Diagnosis not present

## 2017-11-29 DIAGNOSIS — Z923 Personal history of irradiation: Secondary | ICD-10-CM | POA: Insufficient documentation

## 2017-11-29 DIAGNOSIS — C61 Malignant neoplasm of prostate: Secondary | ICD-10-CM | POA: Insufficient documentation

## 2017-11-29 DIAGNOSIS — Z9079 Acquired absence of other genital organ(s): Secondary | ICD-10-CM | POA: Insufficient documentation

## 2017-11-29 DIAGNOSIS — C7952 Secondary malignant neoplasm of bone marrow: Principal | ICD-10-CM

## 2017-11-29 DIAGNOSIS — Z809 Family history of malignant neoplasm, unspecified: Secondary | ICD-10-CM

## 2017-11-29 NOTE — Progress Notes (Signed)
Radiation Oncology         [phone] ________________________________  Initial Outpatient Consultation  Name: Marvon Shillingburg MRN: [id number]  Date of Service: 11/29/2017 DOB: May 16, 1961  CC:Pa, [facility]  [place], Mathis Dad, MD   REFERRING PHYSICIAN: Wyatt Portela, MD  DIAGNOSIS: The primary encounter diagnosis was Malignant neoplasm of prostate [facility]). A diagnosis of Bone metastases ([place]) was also pertinent to this visit.    ICD-10-CM   1. Malignant neoplasm of prostate ([place]) C61   2. Bone metastases ([facility]) C79.51     HISTORY OF PRESENT ILLNESS: Genaro Bekker is a 57 y.o. male seen at the request of Dr. 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 His prostate cancer was orgininally diagnosed in March 2008 with Gleason score 7 and PSA of 31. He is status post prostatectomy done in 2008 with final pathology revealing stage T2cN0, Gleason 4+3 disease. Post-operatively the PSA continued to rise, up to as high as 52. At that time, he was started on ADT with Lupron, given every 3 months. PSA nadir was down to 0.4 and then rose to 2.42 in October 2012. At that time he was started on daily Casodex. He continued therapy with Lupron and Casodex. His PSA started to rise from 3.0 in June 2017 to 4.7 in March 2018. His bone scan from March 2018 showed areas of increased activity in the pelvis. He was further evaluated with an MRI of the right hip on 12/04/2016 which showed metastatic lesions in the right pubic body and the right inferior and superior pubic rami. His Casodex was discontinued at that time due to progression of disease. He was then started on Zytiga with prednisone in May 2018. His PSA in June 2018 was 5.2 and decreased to 2.9 by February 2019. CT of the abdomen/pelvis and bone scans from 09/30/2017 showed no evidence of new metastatic disease, and the bone metastases involving the right pubis and inferior pubic ramus remain stable  despite treatment with Zytiga. His most recent PSA in March 2019 has risen to 3.7.  The patient has kindly been referred today for discussion of potential radiation treatment options. He remains on Zytiga with prednisone, and Lupron with the last injection on 11/18/2017.  PREVIOUS RADIATION THERAPY: No  PAST MEDICAL HISTORY:  Past Medical History:  Diagnosis Date  . Prostate cancer ([place]) 08/2006      PAST SURGICAL HISTORY: Past Surgical History:  Procedure Laterality Date  . PROSTATE BIOPSY    . PROSTATECTOMY    . VASECTOMY      FAMILY HISTORY:  Family History  Problem Relation Age of Onset  . Cancer Father 65       prostate  . Cancer Paternal Grandfather        prostate  . Cancer Paternal Uncle        prostate    SOCIAL HISTORY:  Social History   Socioeconomic History  . Marital status: Married    Spouse name: Not on file  . Number of children: Not on file  . Years of education: Not on file  . Highest education level: Not on file  Occupational History  . Not on file  Social Needs  . Financial resource strain: Not on file  . Food insecurity:    Worry: Not on file    Inability: Not on file  . Transportation needs:    Medical: Not on file    Non-medical: Not on file  Tobacco Use  .  Smoking status: Never Smoker  . Smokeless tobacco: Never Used  Substance and Sexual Activity  . Alcohol use: Yes    Comment: social  . Drug use: Never  . Sexual activity: Not Currently  Lifestyle  . Physical activity:    Days per week: Not on file    Minutes per session: Not on file  . Stress: Not on file  Relationships  . Social connections:    Talks on phone: Not on file    Gets together: Not on file    Attends religious service: Not on file    Active member of club or organization: Not on file    Attends meetings of clubs or organizations: Not on file    Relationship status: Not on file  . Intimate partner violence:    Fear of current or ex partner: Not on file     Emotionally abused: Not on file    Physically abused: Not on file    Forced sexual activity: Not on file  Other Topics Concern  . Not on file  Social History Narrative   [place] to [place]. Works for [employer]. Active runs and rides mountain bikes. Two children: 1 boy, 1 girl.     ALLERGIES: Patient has no known allergies.  MEDICATIONS:  Current Outpatient Medications  Medication Sig Dispense Refill  . abiraterone acetate (ZYTIGA) 250 MG tablet TAKE 4 TABLETS BY MOUTH DAILY ON AN EMPTY STOMACH 1 HOUR BEFORE OR 2 HOURS AFTER A MEAL 120 tablet 0  . Ascorbic Acid (VITAMIN C) 1000 MG tablet Take 1,000 mg by mouth daily.    Marland Kitchen b complex vitamins tablet Take 1 tablet by mouth daily.    . Calcium Carbonate-Vitamin D (CALCIUM + D PO) Take 1 tablet by mouth daily.    Marland Kitchen leuprolide (LUPRON) 22.5 MG injection Inject 22.5 mg into the muscle every 3 (three) months.    . Melatonin 10 MG CAPS Take 12 mg 2 (two) times daily at 8 am and 10 pm by mouth.    . Multiple Vitamins-Minerals (MULTIVITAMIN PO) Take 1 tablet by mouth daily.    . predniSONE (DELTASONE) 5 MG tablet Take 1 tablet (5 mg total) by mouth daily with breakfast. 90 tablet 3  . VITAMIN D, ERGOCALCIFEROL, PO Take 1 tablet by mouth daily.    . Omega-3 Fatty Acids (FISH OIL) 1000 MG CAPS Take 1,000 mg by mouth daily.     No current facility-administered medications for this encounter.     REVIEW OF SYSTEMS:  On review of systems, the patient reports that he is doing well overall. He denies any chest pain, shortness of breath, cough, fevers, chills, night sweats, or unintended weight changes. He denies any bowel or bladder disturbances, and denies abdominal pain, nausea or vomiting. He denies any new musculoskeletal or joint aches or pains, stating that the right hip metastatic lesion is asymptomatic at this time. A complete review of systems is obtained and is otherwise negative.    PHYSICAL EXAM:  Wt Readings from Last 3  Encounters:  11/29/17 162 lb 12.8 oz (73.8 kg)  11/18/17 165 lb 3.2 oz (74.9 kg)  10/05/17 164 lb 8 oz (74.6 kg)   Temp Readings from Last 3 Encounters:  11/29/17 98 F (36.7 C) (Oral)  11/18/17 97.6 F (36.4 C) (Oral)  10/05/17 97.8 F (36.6 C) (Oral)   BP Readings from Last 3 Encounters:  11/29/17 132/77  11/18/17 136/81  10/05/17 (!) 147/80   Pulse Readings from Last  3 Encounters:  11/29/17 66  11/18/17 (!) 57  10/05/17 80   Pain Assessment Pain Score: 0-No pain/10  In general this is a well appearing caucasian male in no acute distress. He's alert and oriented x4 and appropriate throughout the examination. Cardiopulmonary assessment is negative for acute distress and he exhibits normal effort.    KPS = 100  100 - Normal; no complaints; no evidence of disease. 90   - Able to carry on normal activity; minor signs or symptoms of disease. 80   - Normal activity with effort; some signs or symptoms of disease. 18   - Cares for self; unable to carry on normal activity or to do active work. 60   - Requires occasional assistance, but is able to care for most of his personal needs. 50   - Requires considerable assistance and frequent medical care. 52   - Disabled; requires special care and assistance. 33   - Severely disabled; hospital admission is indicated although death not imminent. 22   - Very sick; hospital admission necessary; active supportive treatment necessary. 10   - Moribund; fatal processes progressing rapidly. 0     - Dead  Karnofsky DA, Abelmann [place], Craver LS and Burchenal [facility] [phone]) The use of the nitrogen mustards in the palliative treatment of carcinoma: with particular reference to bronchogenic carcinoma Cancer 1 634-56  LABORATORY DATA:  Lab Results  Component Value Date   WBC 4.6 11/16/2017   HGB 13.6 09/30/2017   HCT 44.0 11/16/2017   MCV 87.4 11/16/2017   PLT 211 11/16/2017   Lab Results  Component Value Date   NA 143 11/16/2017   K 4.2  11/16/2017   CL 108 11/16/2017   CO2 26 11/16/2017   Lab Results  Component Value Date   ALT 32 11/16/2017   AST 30 11/16/2017   ALKPHOS 110 11/16/2017   BILITOT 1.7 (H) 11/16/2017     RADIOGRAPHY: No results found.    IMPRESSION/PLAN: 74. 57 y.o. 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 Today, Dr. Tammi Klippel and I spoke with the patient and his wife about the findings and work-up thus far.  We discussed the natural history of metastatic prostate cancer and general treatment, highlighting the role of radiotherapy in the management.  We discussed the available radiation techniques, including SBRT and external radiation, and focused on the details of logistics and delivery.  We reviewed the anticipated acute and late sequelae associated with radiation in this setting. The patient has an upcoming PSA to be checked in the next 4 weeks, and would like to proceed with this first before starting radiotherapy. The patient is interested in proceeding with radiation therapy but would like to consider his options at this time. We will follow up with him a few weeks to discuss his decision making.  2. Possible genetic predisposition to malignancy. The patient is a candidate for genetic testing given his personal and family history of prostate cancer. He was offered referral and is interested in proceeding with testing. A referral was placed.  In a visit lasting 60 minutes, greater than 50% of the time was spent face to face discussing his case, and coordinating the patient's care.    Carola Rhine, [facility]   Page Me    Seen with _____________________________________  Sheral Apley Tammi Klippel, M.D.  This document serves as a record of services personally performed by Tyler Pita, MD and Shona Simpson, PA-C. It was created on their behalf  by Rae Lips, a trained medical scribe. The creation of this record is based on the scribe's personal observations  and the providers' statements to them. This document has been checked and approved by the attending providers.

## 2017-11-29 NOTE — Progress Notes (Signed)
See progress note under physician encounter. 

## 2017-11-30 ENCOUNTER — Telehealth: Payer: Self-pay | Admitting: *Deleted

## 2017-11-30 ENCOUNTER — Other Ambulatory Visit: Payer: Self-pay | Admitting: Radiation Oncology

## 2017-11-30 NOTE — Telephone Encounter (Signed)
CALLED ANDREA BRAILFORD TO [facility] COUNSEL WITH KAREN POWELL, LVM FOR A RETURN CALL

## 2017-12-01 ENCOUNTER — Other Ambulatory Visit: Payer: BLUE CROSS/BLUE SHIELD

## 2017-12-06 ENCOUNTER — Other Ambulatory Visit: Payer: Self-pay | Admitting: Pharmacist

## 2017-12-13 ENCOUNTER — Other Ambulatory Visit: Payer: Self-pay | Admitting: Oncology

## 2017-12-13 DIAGNOSIS — C61 Malignant neoplasm of prostate: Secondary | ICD-10-CM

## 2017-12-16 MED FILL — ABIRATERONE ACETATE 250 MG: 250 | 30 days supply | Qty: 120 | Fill #0

## 2017-12-20 ENCOUNTER — Telehealth: Payer: Self-pay | Admitting: Radiation Oncology

## 2017-12-20 NOTE — Telephone Encounter (Signed)
Received voicemail message from patient requesting return call. Phoned patient back to inquire. Patient reports he has questions about scheduling, side effects and number of treatments. Patient reports he is scheduled to have his PSA checked on 12/29/2017 then see his urologist to review results on 12/31/2017. Patient questions if he needs to move forward with radiation who does he call to set it up. Provided patient with [facility] contact information as well as mine. Patient reports he works outside and questions what side effects to expect. I explained that fatigue could be expected 1/2 to 3/4 of the way through treatment. Patient questions how many treatments he has to have. Consult note does not detail this thus will question Shona Simpson, PA-C. I answered all the patient's questions to the best of my ability based upon the consult note but patient remains insistent to speak with Shona Simpson, PA-C. Patient reports he can easily be reached in the mornings before 1100 at [phone].

## 2017-12-20 NOTE — Telephone Encounter (Signed)
I called and spoke with the patient and he's going to see Dr. Alen Blew on 5/10 after his next PSA before he determines how he'd like to proceed with SBRT. We reviewed options of SBRT to his site. He will let us know when he's ready to schedule simulation.

## 2017-12-27 ENCOUNTER — Other Ambulatory Visit: Payer: Self-pay | Admitting: Oncology

## 2017-12-27 MED ORDER — PREDNISONE 5 MG PO TABS: 5.0000 mg | ORAL_TABLET | Freq: Every day | ORAL | 3 refills | Status: DC

## 2017-12-27 MED FILL — predniSONE 5 MG TABS: 5 | 30 days supply | Qty: 30 | Fill #0

## 2017-12-29 ENCOUNTER — Inpatient Hospital Stay: Payer: BLUE CROSS/BLUE SHIELD | Attending: Oncology

## 2017-12-29 DIAGNOSIS — I10 Essential (primary) hypertension: Secondary | ICD-10-CM | POA: Insufficient documentation

## 2017-12-29 DIAGNOSIS — C61 Malignant neoplasm of prostate: Secondary | ICD-10-CM

## 2017-12-29 DIAGNOSIS — C7951 Secondary malignant neoplasm of bone: Secondary | ICD-10-CM | POA: Diagnosis not present

## 2017-12-29 DIAGNOSIS — E291 Testicular hypofunction: Secondary | ICD-10-CM | POA: Diagnosis not present

## 2017-12-29 LAB — CBC WITH DIFFERENTIAL (CANCER CENTER ONLY)
BASOS ABS: 0 10*3/uL (ref 0.0–0.1)
BASOS PCT: 1 %
Eosinophils Absolute: 0.2 10*3/uL (ref 0.0–0.5)
Eosinophils Relative: 3 %
HEMATOCRIT: 41.5 % (ref 38.4–49.9)
HEMOGLOBIN: 14.2 g/dL (ref 13.0–17.1)
LYMPHS PCT: 43 %
Lymphs Abs: 2.1 10*3/uL (ref 0.9–3.3)
MCH: 30.1 pg (ref 27.2–33.4)
MCHC: 34.2 g/dL (ref 32.0–36.0)
MCV: 88 fL (ref 79.3–98.0)
MONO ABS: 0.4 10*3/uL (ref 0.1–0.9)
Monocytes Relative: 8 %
NEUTROS ABS: 2.2 10*3/uL (ref 1.5–6.5)
NEUTROS PCT: 45 %
Platelet Count: 190 10*3/uL (ref 140–400)
RBC: 4.72 MIL/uL (ref 4.20–5.82)
RDW: 13 % (ref 11.0–14.6)
WBC: 4.8 10*3/uL (ref 4.0–10.3)

## 2017-12-29 LAB — CMP (CANCER CENTER ONLY)
ALBUMIN: 4 g/dL (ref 3.5–5.0)
ALK PHOS: 103 U/L (ref 40–150)
ALT: 31 U/L (ref 0–55)
ANION GAP: 6 (ref 3–11)
AST: 30 U/L (ref 5–34)
BUN: 16 mg/dL (ref 7–26)
CALCIUM: 9.6 mg/dL (ref 8.4–10.4)
CO2: 27 mmol/L (ref 22–29)
Chloride: 109 mmol/L (ref 98–109)
Creatinine: 0.98 mg/dL (ref 0.70–1.30)
GFR, Est AFR Am: >60 mL/min (ref >60)
GFR, Estimated: >60 mL/min (ref >60)
Glucose, Bld: 74 mg/dL (ref 70–140)
POTASSIUM: 4.2 mmol/L (ref 3.5–5.1)
SODIUM: 142 mmol/L (ref 136–145)
TOTAL PROTEIN: 6.8 g/dL (ref 6.4–8.3)
Total Bilirubin: 1.3 mg/dL — ABNORMAL HIGH (ref 0.2–1.2)

## 2017-12-30 LAB — PROSTATE-SPECIFIC AG, SERUM (LABCORP): PROSTATE SPECIFIC AG, SERUM: 3 ng/mL (ref 0.0–4.0)

## 2017-12-31 ENCOUNTER — Telehealth: Payer: Self-pay

## 2017-12-31 ENCOUNTER — Inpatient Hospital Stay (HOSPITAL_BASED_OUTPATIENT_CLINIC_OR_DEPARTMENT_OTHER): Payer: BLUE CROSS/BLUE SHIELD | Admitting: Oncology

## 2017-12-31 VITALS — BP 132/80 | HR 66 | Temp 97.7°F | Resp 18 | Ht 68.0 in | Wt 163.9 lb

## 2017-12-31 DIAGNOSIS — C61 Malignant neoplasm of prostate: Secondary | ICD-10-CM

## 2017-12-31 DIAGNOSIS — E291 Testicular hypofunction: Secondary | ICD-10-CM

## 2017-12-31 DIAGNOSIS — I10 Essential (primary) hypertension: Secondary | ICD-10-CM | POA: Diagnosis not present

## 2017-12-31 DIAGNOSIS — C7951 Secondary malignant neoplasm of bone: Secondary | ICD-10-CM

## 2017-12-31 NOTE — Progress Notes (Signed)
Hematology and Oncology Follow Up Visit  David Huffman [id number] 07-04-1961 57 y.o. 12/31/2017 1:47 PM  CC: David Messick, MD    Principle Diagnosis: 57 year old man with castration-resistant metastatic prostate cancer with limited disease to the bone.  He had a Gleason score of 7 and a PSA of 31 in 2008.    Prior Therapy: 1. Status post prostatectomy done in 2008, pathology revealing T2c N0 with 0 out of 12 lymph nodes involved.  2. The patient continued to have rise in his PSA up to 52 after prostatectomy without any measurable disease.  Lupron was started at that time. 3.     Casodex 50 mg daily added in 05/2011. Therapy discontinued in March 2018 because of progression of disease.  Current therapy:  He is on hormonal deprivation, Lupron 22.5 mg every 3 months on a continuous basis started in 2008.    Zytiga 1000 mg daily with prednisone 5 mg daily since May 2018.   Interim History:  [name] is here for a follow-up visit.  Since last visit, he reports no major changes in his health.  He continues to work full-time and exercise regularly.  He denies any worsening back pain or hip pain.  He denies any decline in his performance status or activity level.  He continues to take Zytiga without any major complaints.  He denied any nausea, excessive fatigue or edema.  He continues to attend to activities of daily living.  He denies any urination difficulties or change in his bowel habits.  Denies any nausea or vomiting.  He does not report any headaches or blurry vision.he denied  fevers, chills, sweats or weight loss. Does that report any chest pain, palpitation orthopnea.  He does not report any cough, wheezing or hemoptysis.  Does not report any melena. Does not report any frequency urgency or hesitancy.  He does not report any arthralgias or myalgias.  He does not report any skin rashes or lesions.  He does not report any heat or cold intolerance.  He does not report anxiety or depression.  He  does not report any ecchymosis or petechiae.  He denied any lymphadenopathy or easy bruising.  He denies any anxiety or depression.  Rest of his review of systems is negative.  Medications: I have reviewed the patient's current medications. Current Outpatient Medications  Medication Sig Dispense Refill  . abiraterone acetate (ZYTIGA) 250 MG tablet TAKE 4 TABLETS (1000MG ) BY MOUTH DAILY ON AN EMPTY STOMACH 1 HOUR BEFORE OR 2 HOURS AFTER A MEAL 120 tablet 0  . Ascorbic Acid (VITAMIN C) 1000 MG tablet Take 1,000 mg by mouth daily.    Marland Kitchen b complex vitamins tablet Take 1 tablet by mouth daily.    . Calcium Carbonate-Vitamin D (CALCIUM + D PO) Take 1 tablet by mouth daily.    Marland Kitchen leuprolide (LUPRON) 22.5 MG injection Inject 22.5 mg into the muscle every 3 (three) months.    . Melatonin 10 MG CAPS Take 12 mg 2 (two) times daily at 8 am and 10 pm by mouth.    . Multiple Vitamins-Minerals (MULTIVITAMIN PO) Take 1 tablet by mouth daily.    . Omega-3 Fatty Acids (FISH OIL) 1000 MG CAPS Take 1,000 mg by mouth daily.    . predniSONE (DELTASONE) 5 MG tablet Take 1 tablet (5 mg total) by mouth daily with breakfast. 90 tablet 3  . VITAMIN D, ERGOCALCIFEROL, PO Take 1 tablet by mouth daily.     No current facility-administered medications for  this visit.     Allergies: No Known Allergies  Past Medical History, Surgical history, Social history, and Family History updated without any changes today.  Physical Exam: Blood pressure 132/80, pulse 66, temperature 97.7 F (36.5 C), temperature source Oral, resp. rate 18, height 5\' 8"  (1.727 m), weight 163 lb 14.4 oz (74.3 kg), SpO2 100 %.   ECOG: 0 General appearance: Alert, awake gentleman without distress. Head: Cephalic without abnormalities. Oral mucosa: Mucous membranes are moist and pink. Eyes: Sclera anicteric. Lymph nodes: No cervical, axillary or supraclavicular lymphadenopathy. Heart: Regular rate and rhythm without any murmurs or gallops. Lung:  Clear in all lung fields without any rhonchi, wheezes or dullness to percussion. Abdomin: Soft, without any rebound or guarding.  No shifting dullness or ascites. Skin: No skin rashes or lesions. Musculoskeletal: No clubbing or cyanosis. Neurological: No motor or sensory deficits.    Lab Results: Lab Results  Component Value Date   WBC 4.8 12/29/2017   HGB 14.2 12/29/2017   HCT 41.5 12/29/2017   MCV 88.0 12/29/2017   PLT 190 12/29/2017     Chemistry      Component Value Date/Time   NA 142 12/29/2017 0809   NA 142 08/18/2017 0754   K 4.2 12/29/2017 0809   K 4.2 08/18/2017 0754   CL 109 12/29/2017 0809   CL 106 01/03/2013 0916   CO2 27 12/29/2017 0809   CO2 25 08/18/2017 0754   BUN 16 12/29/2017 0809   BUN 14.0 08/18/2017 0754   CREATININE 0.98 12/29/2017 0809   CREATININE 1.0 08/18/2017 0754      Component Value Date/Time   CALCIUM 9.6 12/29/2017 0809   CALCIUM 9.5 08/18/2017 0754   ALKPHOS 103 12/29/2017 0809   ALKPHOS 106 08/18/2017 0754   AST 30 12/29/2017 0809   AST 31 08/18/2017 0754   ALT 31 12/29/2017 0809   ALT 39 08/18/2017 0754   BILITOT 1.3 (H) 12/29/2017 0809   BILITOT 1.26 (H) 08/18/2017 0754       Results for DAMEAN, POFFENBERGER (MRN [id number]) as of 12/31/2017 13:31  Ref. Range 11/16/2017 08:32 12/29/2017 08:09  Prostate Specific Ag, Serum Latest Ref Range: 0.0 - 4.0 ng/mL 3.7 3.0      ASSESSMENT AND PLAN:   57 year old man with:  1. Castration-resistant metastatic prostate cancer with limited disease to the bone.   He remains on Zytiga without any major complications.  He denies any side effects that requires dose reduction or delay.  His PSA continues to be relatively stable currently at 3.0.  2.  Right hip metastatic lesion: He was evaluated by Dr. Tammi Klippel for potential ablative radiation to that lesion.  We discussed this approach extensively today including risks and benefits at this time.  Given his a PSA remains stable but not declining any  further, I continue to recommend treating him locally given his persistent PSA despite maximum systemic therapy.  He will consider this option and will schedule treatment Dr. Tammi Klippel.   3. Androgen depravation: Risks and benefits of continuing Lupron was discussed.  He will continue to receive it every 3 months.  4. Bone health: He has very limited bone metastasis and I encouraged him to continue with calcium and vitamin D.  Xgeva could be a consideration in the future.    5.  Hypertension: His blood pressure is under control without any exacerbation related to Zytiga.  6. Follow-up: Will be in 6 weeks for a follow-up.   25  minutes was spent with the  patient face-to-face today.  More than 50% of time was dedicated to patient counseling, education and coordinating his multifaceted care.   Zola Button, MD 5/10/20191:47 PM

## 2017-12-31 NOTE — Telephone Encounter (Signed)
Printed avs and calender of upcoming appointment. Per 5/10 los. Patient also was under the impression that their 55 year old son was also to be seen for genetic counseling. Called KP and she informed that the son had to be 47, and the appointment was to determine the father links. per 5/10 los

## 2018-01-05 ENCOUNTER — Telehealth: Payer: Self-pay | Admitting: Radiation Oncology

## 2018-01-05 ENCOUNTER — Other Ambulatory Visit: Payer: BLUE CROSS/BLUE SHIELD

## 2018-01-05 ENCOUNTER — Encounter: Payer: BLUE CROSS/BLUE SHIELD | Admitting: Genetic Counselor

## 2018-01-05 NOTE — Telephone Encounter (Signed)
-----   Message from Hayden Pedro, [place] sent at 01/04/2018  4:50 PM EDT ----- Can you call him to see how he'd like to proceed? We were going to treat him locally to prevent fracture... He wanted to wait on his PSA levels and this was stable, but Dr. Alen Blew still recommends XRT. I tried calling him last week but didn't get through.  Thanks, Bryson Ha  ----- Message ----- From: Hayden Pedro, PA-C Sent: 11/29/2017   2:34 PM To: Hayden Pedro, PA-C Subject: 12/30/17                                       Look for PSA

## 2018-01-05 NOTE — Telephone Encounter (Signed)
Phoned patient as requested by Shona Simpson, PA-C. Patient explains his PSA level is down but Dr. Alen Blew is strongly encouraging him to move forward with radiation therapy. Patient expresses his desire to set up radiation. Explained this finding would be passed along to the providers and the simulation staff would be in touch with an appointment. Patient request appointments be set for first thing in the mornings Monday through Thursday or anytime on Friday.

## 2018-01-11 ENCOUNTER — Telehealth: Payer: Self-pay | Admitting: *Deleted

## 2018-01-11 NOTE — Telephone Encounter (Signed)
Called patient to inform of sim on Friday May 24 @ 8:30 am @ Dr. Johny Shears Office, lvm for a return call

## 2018-01-12 ENCOUNTER — Other Ambulatory Visit: Payer: Self-pay | Admitting: Oncology

## 2018-01-12 DIAGNOSIS — C61 Malignant neoplasm of prostate: Secondary | ICD-10-CM

## 2018-01-14 ENCOUNTER — Ambulatory Visit
Admission: RE | Admit: 2018-01-14 | Discharge: 2018-01-14 | Disposition: A | Discharge status: Home / Self Care | Payer: BLUE CROSS/BLUE SHIELD | Source: Ambulatory Visit | Attending: Radiation Oncology | Admitting: Radiation Oncology

## 2018-01-14 DIAGNOSIS — C7951 Secondary malignant neoplasm of bone: Secondary | ICD-10-CM | POA: Diagnosis not present

## 2018-01-14 DIAGNOSIS — Z510 Encounter for antineoplastic radiation therapy: Secondary | ICD-10-CM | POA: Insufficient documentation

## 2018-01-14 DIAGNOSIS — C61 Malignant neoplasm of prostate: Secondary | ICD-10-CM | POA: Diagnosis present

## 2018-01-17 NOTE — Progress Notes (Signed)
  Radiation Oncology         [phone] ________________________________  Name: David Huffman MRN: [id number]  Date: 01/14/2018  DOB: 1961/01/29  STEREOTACTIC BODY RADIOTHERAPY SIMULATION AND TREATMENT PLANNING NOTE    ICD-10-CM   1. Malignant neoplasm of prostate ([place]) C61   2. Bone metastases ([facility]) C79.51     DIAGNOSIS:  57 y.o. 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   NARRATIVE:  The patient was brought to the [place].  Identity was confirmed.  All relevant records and images related to the planned course of therapy were reviewed.  The patient freely provided informed written consent to proceed with treatment after reviewing the details related to the planned course of therapy. The consent form was witnessed and verified by the simulation staff.  Then, the patient was set-up in a stable reproducible  supine position for radiation therapy.  A BodyFix immobilization pillow was fabricated for reproducible positioning.  Surface markings were placed.  The CT images were loaded into the planning software.  The gross target volumes (GTV) and planning target volumes (PTV) were delinieated, and avoidance structures were contoured.  Treatment planning then occurred.  The radiation prescription was entered and confirmed.  A total of two complex treatment devices were fabricated in the form of the BodyFix immobilization pillow and a neck accuform cushion.  I have requested : 3D Simulation  I have requested a DVH of the following structures: targets and all normal structures near the target including rectum, bladder and femoral heads as noted on the radiation plan to maintain doses in adherence with established limits  PLAN:  The patient will receive 50 Gy in 5 fractions.  ________________________________  Sheral Apley Tammi Klippel, M.D.

## 2018-01-18 MED FILL — ABIRATERONE ACETATE 250 MG: 250 | 30 days supply | Qty: 120 | Fill #0

## 2018-01-24 ENCOUNTER — Ambulatory Visit: Payer: BLUE CROSS/BLUE SHIELD

## 2018-01-25 ENCOUNTER — Ambulatory Visit: Payer: BLUE CROSS/BLUE SHIELD

## 2018-01-26 ENCOUNTER — Ambulatory Visit: Payer: BLUE CROSS/BLUE SHIELD

## 2018-01-26 ENCOUNTER — Ambulatory Visit
Admission: RE | Admit: 2018-01-26 | Discharge: 2018-01-26 | Disposition: A | Discharge status: Home / Self Care | Payer: BLUE CROSS/BLUE SHIELD | Source: Ambulatory Visit | Attending: Radiation Oncology | Admitting: Radiation Oncology

## 2018-01-26 DIAGNOSIS — C7951 Secondary malignant neoplasm of bone: Secondary | ICD-10-CM | POA: Diagnosis not present

## 2018-01-26 DIAGNOSIS — Z510 Encounter for antineoplastic radiation therapy: Secondary | ICD-10-CM | POA: Insufficient documentation

## 2018-01-26 DIAGNOSIS — C61 Malignant neoplasm of prostate: Secondary | ICD-10-CM | POA: Insufficient documentation

## 2018-01-27 ENCOUNTER — Ambulatory Visit: Payer: BLUE CROSS/BLUE SHIELD

## 2018-01-28 ENCOUNTER — Ambulatory Visit
Admission: RE | Admit: 2018-01-28 | Discharge: 2018-01-28 | Disposition: A | Discharge status: Home / Self Care | Payer: BLUE CROSS/BLUE SHIELD | Source: Ambulatory Visit | Attending: Radiation Oncology | Admitting: Radiation Oncology

## 2018-01-28 ENCOUNTER — Ambulatory Visit: Payer: BLUE CROSS/BLUE SHIELD

## 2018-01-28 DIAGNOSIS — Z510 Encounter for antineoplastic radiation therapy: Secondary | ICD-10-CM | POA: Diagnosis not present

## 2018-01-31 ENCOUNTER — Ambulatory Visit
Admission: RE | Admit: 2018-01-31 | Discharge: 2018-01-31 | Disposition: A | Discharge status: Home / Self Care | Payer: BLUE CROSS/BLUE SHIELD | Source: Ambulatory Visit | Attending: Radiation Oncology | Admitting: Radiation Oncology

## 2018-01-31 DIAGNOSIS — Z510 Encounter for antineoplastic radiation therapy: Secondary | ICD-10-CM | POA: Diagnosis not present

## 2018-02-02 ENCOUNTER — Ambulatory Visit
Admission: RE | Admit: 2018-02-02 | Discharge: 2018-02-02 | Disposition: A | Discharge status: Home / Self Care | Payer: BLUE CROSS/BLUE SHIELD | Source: Ambulatory Visit | Attending: Radiation Oncology | Admitting: Radiation Oncology

## 2018-02-02 DIAGNOSIS — Z510 Encounter for antineoplastic radiation therapy: Secondary | ICD-10-CM | POA: Diagnosis not present

## 2018-02-02 MED FILL — predniSONE 5 MG TABS: 5 | 30 days supply | Qty: 30 | Fill #1

## 2018-02-04 ENCOUNTER — Encounter: Payer: Self-pay | Admitting: Urology

## 2018-02-04 ENCOUNTER — Ambulatory Visit
Admission: RE | Admit: 2018-02-04 | Discharge: 2018-02-04 | Disposition: A | Discharge status: Home / Self Care | Payer: BLUE CROSS/BLUE SHIELD | Source: Ambulatory Visit | Attending: Radiation Oncology | Admitting: Radiation Oncology

## 2018-02-04 DIAGNOSIS — Z510 Encounter for antineoplastic radiation therapy: Secondary | ICD-10-CM | POA: Diagnosis not present

## 2018-02-09 ENCOUNTER — Other Ambulatory Visit: Payer: Self-pay | Admitting: Oncology

## 2018-02-09 DIAGNOSIS — C61 Malignant neoplasm of prostate: Secondary | ICD-10-CM

## 2018-02-10 NOTE — Progress Notes (Signed)
  Radiation Oncology         [phone] ________________________________  Name: David Huffman MRN: [id number]  Date: 02/04/2018  DOB: [birth year]  End of Treatment Note  Diagnosis:   57 y.o. 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      Indication for treatment:  Curative, Definitive SBRT       Radiation treatment dates:   01/26/18, 01/28/18, 01/1018, 02/02/18 & 02/04/18  Site/dose:   The target was treated to 50 Gy in 5 fractions of 10 Gy  Beams/energy:   The patient was treated using stereotactic body radiotherapy according to a 3D conformal radiotherapy plan.  Volumetric arc fields were employed to deliver 6 MV X-rays.  Image guidance was performed with per fraction cone beam CT prior to treatment under personal MD supervision.  Immobilization was achieved using BodyFix Pillow.  Narrative: The patient tolerated radiation treatment relatively well.   He did not experience any ill side effects from treatment.  He specifically denied dysuria, gross hematuria, excessive daytime frequency of urination, urinary urgency, abdominal pain, N/V or diarrhea.   Plan: The patient has completed radiation treatment. The patient will return to radiation oncology clinic for routine followup in one month. I advised them to call or return sooner if they have any questions or concerns related to their recovery or treatment. ________________________________  Sheral Apley. Tammi Klippel, M.D.

## 2018-02-16 ENCOUNTER — Inpatient Hospital Stay: Payer: BLUE CROSS/BLUE SHIELD | Attending: Oncology

## 2018-02-16 DIAGNOSIS — Z5111 Encounter for antineoplastic chemotherapy: Secondary | ICD-10-CM | POA: Insufficient documentation

## 2018-02-16 DIAGNOSIS — C61 Malignant neoplasm of prostate: Secondary | ICD-10-CM

## 2018-02-16 DIAGNOSIS — C7951 Secondary malignant neoplasm of bone: Secondary | ICD-10-CM | POA: Insufficient documentation

## 2018-02-16 LAB — CBC WITH DIFFERENTIAL (CANCER CENTER ONLY)
Basophils Absolute: 0 10*3/uL (ref 0.0–0.1)
Basophils Relative: 1 %
EOS ABS: 0.1 10*3/uL (ref 0.0–0.5)
EOS PCT: 3 %
HCT: 42 % (ref 38.4–49.9)
HEMOGLOBIN: 14.6 g/dL (ref 13.0–17.1)
LYMPHS PCT: 36 %
Lymphs Abs: 1.5 10*3/uL (ref 0.9–3.3)
MCH: 30 pg (ref 27.2–33.4)
MCHC: 34.7 g/dL (ref 32.0–36.0)
MCV: 86.4 fL (ref 79.3–98.0)
Monocytes Absolute: 0.5 10*3/uL (ref 0.1–0.9)
Monocytes Relative: 11 %
NEUTROS ABS: 2.1 10*3/uL (ref 1.5–6.5)
Neutrophils Relative %: 49 %
Platelet Count: 194 10*3/uL (ref 140–400)
RBC: 4.86 MIL/uL (ref 4.20–5.82)
RDW: 12.9 % (ref 11.0–14.6)
WBC: 4.3 10*3/uL (ref 4.0–10.3)

## 2018-02-16 LAB — CMP (CANCER CENTER ONLY)
ALT: 34 U/L (ref 0–44)
ANION GAP: 8 (ref 5–15)
AST: 25 U/L (ref 15–41)
Albumin: 4 g/dL (ref 3.5–5.0)
Alkaline Phosphatase: 103 U/L (ref 38–126)
BILIRUBIN TOTAL: 2 mg/dL — AB (ref 0.3–1.2)
BUN: 15 mg/dL (ref 6–20)
CO2: 27 mmol/L (ref 22–32)
Calcium: 9.8 mg/dL (ref 8.9–10.3)
Chloride: 106 mmol/L (ref 98–111)
Creatinine: 0.92 mg/dL (ref 0.61–1.24)
GFR, Est AFR Am: >60 mL/min (ref >60)
Glucose, Bld: 94 mg/dL (ref 70–99)
POTASSIUM: 4.7 mmol/L (ref 3.5–5.1)
Sodium: 141 mmol/L (ref 135–145)
TOTAL PROTEIN: 6.9 g/dL (ref 6.5–8.1)

## 2018-02-16 MED FILL — ABIRATERONE ACETATE 250 MG: 250 | 30 days supply | Qty: 120 | Fill #0

## 2018-02-17 ENCOUNTER — Telehealth: Payer: Self-pay | Admitting: Oncology

## 2018-02-17 ENCOUNTER — Inpatient Hospital Stay: Payer: BLUE CROSS/BLUE SHIELD

## 2018-02-17 ENCOUNTER — Inpatient Hospital Stay (HOSPITAL_BASED_OUTPATIENT_CLINIC_OR_DEPARTMENT_OTHER): Payer: BLUE CROSS/BLUE SHIELD | Admitting: Oncology

## 2018-02-17 VITALS — BP 136/76 | HR 71 | Temp 98.0°F | Resp 18 | Ht 68.0 in | Wt 164.4 lb

## 2018-02-17 DIAGNOSIS — K5900 Constipation, unspecified: Secondary | ICD-10-CM | POA: Diagnosis not present

## 2018-02-17 DIAGNOSIS — I10 Essential (primary) hypertension: Secondary | ICD-10-CM

## 2018-02-17 DIAGNOSIS — Z923 Personal history of irradiation: Secondary | ICD-10-CM

## 2018-02-17 DIAGNOSIS — C7951 Secondary malignant neoplasm of bone: Secondary | ICD-10-CM | POA: Diagnosis not present

## 2018-02-17 DIAGNOSIS — K6289 Other specified diseases of anus and rectum: Secondary | ICD-10-CM

## 2018-02-17 DIAGNOSIS — E291 Testicular hypofunction: Secondary | ICD-10-CM | POA: Diagnosis not present

## 2018-02-17 DIAGNOSIS — C61 Malignant neoplasm of prostate: Secondary | ICD-10-CM

## 2018-02-17 LAB — PROSTATE-SPECIFIC AG, SERUM (LABCORP): PROSTATE SPECIFIC AG, SERUM: 3 ng/mL (ref 0.0–4.0)

## 2018-02-17 MED ORDER — LEUPROLIDE ACETATE (3 MONTH) 22.5 MG IM KIT
22.5000 mg | PACK | Freq: Once | INTRAMUSCULAR | Status: AC
  Administered 2018-02-17: 22.5 mg via INTRAMUSCULAR

## 2018-02-17 NOTE — Progress Notes (Signed)
Hematology and Oncology Follow Up Visit  David Huffman [id number] March 18, 1961 57 y.o. 02/17/2018 1:21 PM  CC: David Messick, MD    Principle Diagnosis: 57 year old man with castration-resistant metastatic prostate cancer with with initial diagnosis in 2008 with a Gleason score of 7 and a PSA of 31.  He developed limited disease to the bone in May 2018.  Prior Therapy: 1. Status post prostatectomy done in 2008, pathology revealing T2c N0 with 0 out of 12 lymph nodes involved.  2. The patient continued to have rise in his PSA up to 52 after prostatectomy without any measurable disease.  Lupron was started at that time. 3.     Casodex 50 mg daily added in 05/2011. Therapy discontinued in March 2018 because of progression of disease. 4.     Status post radiation therapy to the pelvic bone completed in June 2019 he received 50 Gy in 10 fractions.   Current therapy:  He is on hormonal deprivation, Lupron 22.5 mg every 3 months on a continuous basis started in 2008.    Zytiga 1000 mg daily with prednisone 5 mg daily since May 2018.   Interim History:  David Huffman resents today for a follow-up.  Since the last visit, he completed radiation therapy about complications.  He does report some rectal irritation and mild constipation associated with it.  He denies any hematochezia or melena.  He denies any change in his bowel habits.  He has been riding his bike which might of irritated that area when he moves his bowel.  He remains active and continues to attend activities of daily living.  Continues to work full-time and exercises regularly.   He does not report any headaches or blurry vision.he does not report any alteration in mental status or confusion.  He denied  fevers, chills, sweats. Does that report any chest pain, palpitation orthopnea.  He denies any leg edema shortness of breath.  He does not report any cough, wheezing or hemoptysis.  Does not report any early satiety or abdominal distention.  Does  not report any frequency urgency or hesitancy.  He does not report any bone pain or pathological fractures.  He does not report any skin rashes or lesions.  He does does not report any change in bone pain or pathological fractures..  He does not report any ecchymosis or petechiae.  He denied any lymphadenopathy.  He denies any bleeding or bruising tendencies.  He denies any anxiety or depression.  Rest of his review of systems is negative.  Medications: I have reviewed the patient's current medications. Current Outpatient Medications  Medication Sig Dispense Refill  . abiraterone acetate (ZYTIGA) 250 MG tablet TAKE 4 TABLETS (1000MG ) BY MOUTH DAILY ON AN EMPTY STOMACH 1 HOUR BEFORE OR 2 HOURS AFTER A MEAL 120 tablet 0  . Ascorbic Acid (VITAMIN C) 1000 MG tablet Take 1,000 mg by mouth daily.    Marland Kitchen b complex vitamins tablet Take 1 tablet by mouth daily.    . Calcium Carbonate-Vitamin D (CALCIUM + D PO) Take 1 tablet by mouth daily.    Marland Kitchen leuprolide (LUPRON) 22.5 MG injection Inject 22.5 mg into the muscle every 3 (three) months.    . Melatonin 10 MG CAPS Take 12 mg 2 (two) times daily at 8 am and 10 pm by mouth.    . Multiple Vitamins-Minerals (MULTIVITAMIN PO) Take 1 tablet by mouth daily.    . Omega-3 Fatty Acids (FISH OIL) 1000 MG CAPS Take 1,000 mg by mouth daily.    Marland Kitchen  predniSONE (DELTASONE) 5 MG tablet Take 1 tablet (5 mg total) by mouth daily with breakfast. 90 tablet 3  . VITAMIN D, ERGOCALCIFEROL, PO Take 1 tablet by mouth daily.     No current facility-administered medications for this visit.     Allergies: No Known Allergies  Past Medical History, Surgical history, Social history, and Family History updated without any changes today.  Physical Exam:  Blood pressure 136/76, pulse 71, temperature 98 F (36.7 C), temperature source Oral, resp. rate 18, height 5\' 8"  (1.727 m), weight 164 lb 6.4 oz (74.6 kg), SpO2 100 %.   ECOG: 0 General appearance: Well-appearing gentleman without  distress. Head: Atraumatic without abnormalities. Oral mucosa: No oral thrush or ulcers. Eyes: Pupils are equal and round reactive to light. Lymph nodes: No lymphadenopathy noted in the cervical, axillary or supraclavicular agents. Heart: Regular rate and rhythm.  S1 and S2 without any leg edema. Lung: Clear without any rhonchi, wheezes or dullness to percussion. Abdomin: Soft, not any rebound or guarding.  No shifting dullness or ascites. Skin: No ecchymosis or petechiae. Musculoskeletal: Full range of motion in all joints. Neurological: No deficits noted in the motor, sensory exam.    Lab Results: Lab Results  Component Value Date   WBC 4.3 02/16/2018   HGB 14.6 02/16/2018   HCT 42.0 02/16/2018   MCV 86.4 02/16/2018   PLT 194 02/16/2018     Chemistry      Component Value Date/Time   NA 141 02/16/2018 0801   NA 142 08/18/2017 0754   K 4.7 02/16/2018 0801   K 4.2 08/18/2017 0754   CL 106 02/16/2018 0801   CL 106 01/03/2013 0916   CO2 27 02/16/2018 0801   CO2 25 08/18/2017 0754   BUN 15 02/16/2018 0801   BUN 14.0 08/18/2017 0754   CREATININE 0.92 02/16/2018 0801   CREATININE 1.0 08/18/2017 0754      Component Value Date/Time   CALCIUM 9.8 02/16/2018 0801   CALCIUM 9.5 08/18/2017 0754   ALKPHOS 103 02/16/2018 0801   ALKPHOS 106 08/18/2017 0754   AST 25 02/16/2018 0801   AST 31 08/18/2017 0754   ALT 34 02/16/2018 0801   ALT 39 08/18/2017 0754   BILITOT 2.0 (H) 02/16/2018 0801   BILITOT 1.26 (H) 08/18/2017 0754        Results for David Huffman (MRN [id number]) as of 02/17/2018 12:55  Ref. Range 12/29/2017 08:09 02/16/2018 08:01  Prostate Specific Ag, Serum Latest Ref Range: 0.0 - 4.0 ng/mL 3.0 3.0      ASSESSMENT AND PLAN:   57 year old man with:  1. Castration-resistant metastatic prostate cancer documented in 2018 with limited disease to the bone.  Diagnosis was in 2008.   He is currently on Zytiga and he has tolerated it very well without any  complications.  His PSA continues to be stable without any changes.  Risks and benefits of continuing this therapy was reviewed today and is agreeable to continue.    2.  Right hip metastatic lesion: Status post radiation therapy without any complications at this time.   3. Androgen depravation: We will continue on Lupron every 3 months.  His next injection will be received today followed by another one in September.  4. Bone health: He is currently on calcium and vitamin D with consideration for Xgeva.  5.  Hypertension: His blood pressure remains within normal range.  No issues related to his Zytiga at this time.  6. Follow-up: Will be in 6 weeks for  a follow-up.   15  minutes was spent with the patient face-to-face today.  More than 50% of time was dedicated to patient counseling, education and discussing future plan of care.   Zola Button, MD 6/27/20191:21 PM

## 2018-02-17 NOTE — Telephone Encounter (Signed)
Appointments scheduled AVS/Calendar printed per 6/27 los °

## 2018-03-02 ENCOUNTER — Telehealth: Payer: Self-pay | Admitting: Oncology

## 2018-03-02 NOTE — Telephone Encounter (Signed)
Called pt re appt change on 8/1 - sending confirmation letter in the mail.

## 2018-03-09 ENCOUNTER — Encounter: Payer: Self-pay | Admitting: Urology

## 2018-03-09 ENCOUNTER — Other Ambulatory Visit: Payer: Self-pay | Admitting: Oncology

## 2018-03-09 ENCOUNTER — Other Ambulatory Visit: Payer: Self-pay | Admitting: *Deleted

## 2018-03-09 ENCOUNTER — Ambulatory Visit
Admission: RE | Admit: 2018-03-09 | Discharge: 2018-03-09 | Disposition: A | Discharge status: Home / Self Care | Payer: BLUE CROSS/BLUE SHIELD | Source: Ambulatory Visit | Attending: Urology | Admitting: Urology

## 2018-03-09 VITALS — BP 133/83 | HR 65 | Temp 97.7°F | Resp 20 | Ht 68.0 in | Wt 163.8 lb

## 2018-03-09 DIAGNOSIS — Z79899 Other long term (current) drug therapy: Secondary | ICD-10-CM | POA: Insufficient documentation

## 2018-03-09 DIAGNOSIS — Z7952 Long term (current) use of systemic steroids: Secondary | ICD-10-CM | POA: Diagnosis not present

## 2018-03-09 DIAGNOSIS — C7951 Secondary malignant neoplasm of bone: Secondary | ICD-10-CM | POA: Diagnosis present

## 2018-03-09 DIAGNOSIS — C61 Malignant neoplasm of prostate: Secondary | ICD-10-CM | POA: Diagnosis present

## 2018-03-09 MED ORDER — PREDNISONE 5 MG PO TABS: 5.0000 mg | ORAL_TABLET | Freq: Every day | ORAL | 3 refills | Status: DC

## 2018-03-09 MED FILL — predniSONE 5 MG TABS: 5 | 30 days supply | Qty: 30 | Fill #0

## 2018-03-09 NOTE — Addendum Note (Signed)
Encounter addended by: Malena Edman, RN on: 03/09/2018 10:27 AM  Actions taken: Charge Capture section accepted

## 2018-03-09 NOTE — Progress Notes (Signed)
Radiation Oncology         [phone] ________________________________  Name: David Huffman MRN: [id number]  Date: 03/09/2018  DOB: [birth year]  Post Treatment Note  CC: [facility]  Wyatt Portela, MD  Diagnosis:   57 y.o. 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    Interval Since Last Radiation:  4 weeks s/p completion of Curative, Definitive SBRT       01/26/18, 01/28/18, 01/1018, 02/02/18 & 02/04/18: The target lesion in the right pubis and inferior pubic ramus were treated to 50 Gy in 5 fractions of 10 Gy  Narrative:  The patient returns today for routine follow-up. He tolerated radiation treatment relatively well.   He did not experience any ill side effects from treatment.  He specifically denied dysuria, gross hematuria, excessive daytime frequency of urination, urinary urgency, abdominal pain, N/V or diarrhea.   On review of systems, the patient states that he is doing very well overall.  He denies any pelvic or hip pain.  He also denies dysuria, gross hematuria, excessive daytime frequency, incomplete bladder emptying or incontinence.  The rectal discomfort that he experienced towards the end of his treatment has now resolved and he reports normal, formed bowel movements at this point.  He denies abdominal pain, nausea, vomiting or diarrhea.  He reports a healthy appetite and is maintaining his weight.  He continues with mild fatigue associated with his ADT.  He remains on Zytiga, prednisone and Lupron, under the care and direction of Dr. Alen Blew for management of his prostate cancer.  Most recent PSA was stable at 3.0 on 02/16/18.   ALLERGIES:  has No Known Allergies.  Meds: Current Outpatient Medications  Medication Sig Dispense Refill  . abiraterone acetate (ZYTIGA) 250 MG tablet TAKE 4 TABLETS (1000MG ) BY MOUTH DAILY ON AN EMPTY STOMACH 1 HOUR BEFORE OR 2 HOURS AFTER A MEAL 120 tablet 0  . Ascorbic Acid (VITAMIN C)  1000 MG tablet Take 1,000 mg by mouth daily.    Marland Kitchen b complex vitamins tablet Take 1 tablet by mouth daily.    . Calcium Carbonate-Vitamin D (CALCIUM + D PO) Take 1 tablet by mouth daily.    Marland Kitchen leuprolide (LUPRON) 22.5 MG injection Inject 22.5 mg into the muscle every 3 (three) months.    . Melatonin 10 MG CAPS Take 12 mg 2 (two) times daily at 8 am and 10 pm by mouth.    . Multiple Vitamins-Minerals (MULTIVITAMIN PO) Take 1 tablet by mouth daily.    . Omega-3 Fatty Acids (FISH OIL) 1000 MG CAPS Take 1,000 mg by mouth daily.    . predniSONE (DELTASONE) 5 MG tablet Take 1 tablet (5 mg total) by mouth daily with breakfast. 90 tablet 3  . VITAMIN D, ERGOCALCIFEROL, PO Take 1 tablet by mouth daily.     No current facility-administered medications for this encounter.     Physical Findings:  height is 5\' 8"  (1.727 m) and weight is 163 lb 12.8 oz (74.3 kg). His oral temperature is 97.7 F (36.5 C). His blood pressure is 133/83 and his pulse is 65. His respiration is 20 and oxygen saturation is 100%.  Pain Assessment Pain Score: 0-No pain/10 In general this is a well appearing Caucasian male in no acute distress.  He's alert and oriented x4 and appropriate throughout the examination. Cardiopulmonary assessment is negative for acute distress and he exhibits normal effort.   Lab Findings: Lab Results  Component Value  Date   WBC 4.3 02/16/2018   HGB 14.6 02/16/2018   HCT 42.0 02/16/2018   MCV 86.4 02/16/2018   PLT 194 02/16/2018     Radiographic Findings: No results found.  Impression/Plan: 49. 57 y.o. 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 He appears to have recovered well from his recent radiotherapy.  He is currently without complaints.  We discussed that while we are happy to continue to participate in his care if clinically indicated, at this point, we will plan to see him back on an as-needed basis.  He will continue in routine follow-up with  Dr. Alen Blew for continued disease management and plans to continue on his current systemic regimen with Zytiga and Lupron at the direction of Dr. Alen Blew.  He has a scheduled follow-up with Dr. Alen Blew on March 31, 2018 with plans for a repeat PSA prior to that visit.  He appears to have a good understanding of his disease and current treatment recommendations and is in agreement with and comfortable with the plan as stated.  He knows to call the office at anytime with any questions or concerns related to his recent radiotherapy.    Nicholos Johns, PA-C

## 2018-03-16 ENCOUNTER — Other Ambulatory Visit: Payer: Self-pay | Admitting: Oncology

## 2018-03-16 DIAGNOSIS — C61 Malignant neoplasm of prostate: Secondary | ICD-10-CM

## 2018-03-17 MED FILL — ABIRATERONE ACETATE 250 MG: 250 | 30 days supply | Qty: 120 | Fill #0

## 2018-03-29 ENCOUNTER — Inpatient Hospital Stay: Payer: BLUE CROSS/BLUE SHIELD | Attending: Oncology

## 2018-03-29 DIAGNOSIS — C61 Malignant neoplasm of prostate: Secondary | ICD-10-CM

## 2018-03-29 DIAGNOSIS — E291 Testicular hypofunction: Secondary | ICD-10-CM | POA: Insufficient documentation

## 2018-03-29 DIAGNOSIS — C7951 Secondary malignant neoplasm of bone: Secondary | ICD-10-CM | POA: Diagnosis not present

## 2018-03-29 DIAGNOSIS — I10 Essential (primary) hypertension: Secondary | ICD-10-CM | POA: Insufficient documentation

## 2018-03-29 LAB — CMP (CANCER CENTER ONLY)
ALBUMIN: 4 g/dL (ref 3.5–5.0)
ALT: 29 U/L (ref 0–44)
AST: 25 U/L (ref 15–41)
Alkaline Phosphatase: 104 U/L (ref 38–126)
Anion gap: 10 (ref 5–15)
BUN: 14 mg/dL (ref 6–20)
CHLORIDE: 107 mmol/L (ref 98–111)
CO2: 26 mmol/L (ref 22–32)
CREATININE: 0.88 mg/dL (ref 0.61–1.24)
Calcium: 9.4 mg/dL (ref 8.9–10.3)
GFR, Est AFR Am: >60 mL/min (ref >60)
GFR, Estimated: >60 mL/min (ref >60)
Glucose, Bld: 76 mg/dL (ref 70–99)
POTASSIUM: 4.3 mmol/L (ref 3.5–5.1)
SODIUM: 143 mmol/L (ref 135–145)
Total Bilirubin: 1.9 mg/dL — ABNORMAL HIGH (ref 0.3–1.2)
Total Protein: 6.9 g/dL (ref 6.5–8.1)

## 2018-03-29 LAB — CBC WITH DIFFERENTIAL (CANCER CENTER ONLY)
Basophils Absolute: 0 10*3/uL (ref 0.0–0.1)
Basophils Relative: 0 %
EOS PCT: 3 %
Eosinophils Absolute: 0.2 10*3/uL (ref 0.0–0.5)
HCT: 42.2 % (ref 38.4–49.9)
HEMOGLOBIN: 14.4 g/dL (ref 13.0–17.1)
LYMPHS PCT: 36 %
Lymphs Abs: 1.7 10*3/uL (ref 0.9–3.3)
MCH: 30.3 pg (ref 27.2–33.4)
MCHC: 34.1 g/dL (ref 32.0–36.0)
MCV: 88.7 fL (ref 79.3–98.0)
MONOS PCT: 8 %
Monocytes Absolute: 0.4 10*3/uL (ref 0.1–0.9)
NEUTROS PCT: 53 %
Neutro Abs: 2.5 10*3/uL (ref 1.5–6.5)
Platelet Count: 199 10*3/uL (ref 140–400)
RBC: 4.76 MIL/uL (ref 4.20–5.82)
RDW: 13 % (ref 11.0–14.6)
WBC Count: 4.8 10*3/uL (ref 4.0–10.3)

## 2018-03-30 LAB — PROSTATE-SPECIFIC AG, SERUM (LABCORP): Prostate Specific Ag, Serum: 2 ng/mL (ref 0.0–4.0)

## 2018-03-31 ENCOUNTER — Inpatient Hospital Stay (HOSPITAL_BASED_OUTPATIENT_CLINIC_OR_DEPARTMENT_OTHER): Payer: BLUE CROSS/BLUE SHIELD | Admitting: Oncology

## 2018-03-31 ENCOUNTER — Telehealth: Payer: Self-pay

## 2018-03-31 VITALS — BP 133/76 | HR 70 | Temp 98.1°F | Resp 17 | Ht 68.0 in | Wt 163.1 lb

## 2018-03-31 DIAGNOSIS — E291 Testicular hypofunction: Secondary | ICD-10-CM

## 2018-03-31 DIAGNOSIS — C7951 Secondary malignant neoplasm of bone: Secondary | ICD-10-CM

## 2018-03-31 DIAGNOSIS — I10 Essential (primary) hypertension: Secondary | ICD-10-CM

## 2018-03-31 DIAGNOSIS — C61 Malignant neoplasm of prostate: Secondary | ICD-10-CM

## 2018-03-31 MED ORDER — PREDNISONE 5 MG PO TABS: 5.0000 mg | ORAL_TABLET | Freq: Every day | ORAL | 3 refills | Status: DC

## 2018-03-31 NOTE — Telephone Encounter (Signed)
Printed avs and calender of upcoming appointment. Per 8/8 los 

## 2018-03-31 NOTE — Progress Notes (Signed)
Hematology and Oncology Follow Up Visit  David Huffman [id number] [birth year] 57 y.o. 03/31/2018 10:55 AM  CC: David Messick, MD    Principle Diagnosis: 57 year old man with prostate cancer diagnosed in 2008 and developed castration-resistant disease in 2018 with a limited bone disease.  He had Gleason score of 7 and a PSA of 31 at the time of diagnosis.  Prior Therapy: 1. Status post prostatectomy done in 2008, pathology revealing T2c N0 with 0 out of 12 lymph nodes involved.  2. The patient continued to have rise in his PSA up to 52 after prostatectomy without any measurable disease.  Lupron was started at that time. 3.     Casodex 50 mg daily added in 05/2011. Therapy discontinued in March 2018 because of progression of disease. 4.     Status post radiation therapy to the pelvic bone completed in June 2019 he received 50 Gy in 10 fractions.   Current therapy:  He is on hormonal deprivation, Lupron 22.5 mg every 3 to 4  months on a continuous basis started in 2008.    Zytiga 1000 mg daily with prednisone 5 mg daily since May 2018.   Interim History:  [name] is here for a follow-up visit.  Since last visit, he reports no changes in his health.  He continues to be active and attends activities of daily living without any decline.  Continues to work full-time and was able to [employer] bike with his friends without any issues.  He denies any complications related to Zytiga or prednisone.  He is under a lot of stress for unrelated issues but no physical complaints.  He denies any new back pain or shoulder pain.  He denies any pathological fractures.  He does not report any headaches or blurry vision.  He denied  fevers, chills, sweats.  Appetite and weight is stable.  Does that report any chest pain, palpitation orthopnea.  He denies any leg edema shortness of breath.  He does not report any cough, wheezing or hemoptysis.  He denies any nausea or vomiting or abdominal pain.  Does not report any  frequency urgency or hesitancy.  He does not report any paralysis or myalgias.  He does not report any skin rashes or lesions.  He does not report any ecchymosis or petechiae.  He denied any lymphadenopathy.  Denies any heat or cold intolerance.  He denies any mood changes.  Rest of his review of systems is negative.  Medications: I have reviewed the patient's current medications. Current Outpatient Medications  Medication Sig Dispense Refill  . abiraterone acetate (ZYTIGA) 250 MG tablet TAKE 4 TABLETS (1000MG ) BY MOUTH DAILY ON AN EMPTY STOMACH 1 HOUR BEFORE OR 2 HOURS AFTER A MEAL 120 tablet 0  . Ascorbic Acid (VITAMIN C) 1000 MG tablet Take 1,000 mg by mouth daily.    Marland Kitchen b complex vitamins tablet Take 1 tablet by mouth daily.    . Calcium Carbonate-Vitamin D (CALCIUM + D PO) Take 1 tablet by mouth daily.    Marland Kitchen leuprolide (LUPRON) 22.5 MG injection Inject 22.5 mg into the muscle every 3 (three) months.    . Melatonin 10 MG CAPS Take 12 mg 2 (two) times daily at 8 am and 10 pm by mouth.    . Multiple Vitamins-Minerals (MULTIVITAMIN PO) Take 1 tablet by mouth daily.    . Omega-3 Fatty Acids (FISH OIL) 1000 MG CAPS Take 1,000 mg by mouth daily.    . predniSONE (DELTASONE) 5 MG tablet Take 1  tablet (5 mg total) by mouth daily with breakfast. 90 tablet 3  . VITAMIN D, ERGOCALCIFEROL, PO Take 1 tablet by mouth daily.     No current facility-administered medications for this visit.     Allergies: No Known Allergies  Past Medical History, Surgical history, Social history, and Family History updated without any changes today.  Physical Exam:  Blood pressure 133/76, pulse 70, temperature 98.1 F (36.7 C), temperature source Oral, resp. rate 17, height 5\' 8"  (1.727 m), weight 163 lb 1.6 oz (74 kg), SpO2 100 %.   ECOG: 0 General appearance: Alert, awake gentleman without distress. Head: Normocephalic without abnormalities. Oropharynx: Membranes are moist and pink. Eyes: Pupils are equal and  round reactive to light. Lymph nodes: No  cervical, axillary, inguinal or supraclavicular adenopathy. Heart: Regular rate and rhythm.  Without any murmurs or gallops.  S1 and S2. Lung: Clear without any rhonchi, wheezes or dullness to percussion. Abdomin: Soft, nontender, nondistended with good bowel sounds. Skin: No rashes or lesions. Musculoskeletal: No joint deformity or effusion. Neurological: Intact on exam motor, sensory and deep tendon reflexes.    Lab Results: Lab Results  Component Value Date   WBC 4.8 03/29/2018   HGB 14.4 03/29/2018   HCT 42.2 03/29/2018   MCV 88.7 03/29/2018   PLT 199 03/29/2018     Chemistry      Component Value Date/Time   NA 143 03/29/2018 0849   NA 142 08/18/2017 0754   K 4.3 03/29/2018 0849   K 4.2 08/18/2017 0754   CL 107 03/29/2018 0849   CL 106 01/03/2013 0916   CO2 26 03/29/2018 0849   CO2 25 08/18/2017 0754   BUN 14 03/29/2018 0849   BUN 14.0 08/18/2017 0754   CREATININE 0.88 03/29/2018 0849   CREATININE 1.0 08/18/2017 0754      Component Value Date/Time   CALCIUM 9.4 03/29/2018 0849   CALCIUM 9.5 08/18/2017 0754   ALKPHOS 104 03/29/2018 0849   ALKPHOS 106 08/18/2017 0754   AST 25 03/29/2018 0849   AST 31 08/18/2017 0754   ALT 29 03/29/2018 0849   ALT 39 08/18/2017 0754   BILITOT 1.9 (H) 03/29/2018 0849   BILITOT 1.26 (H) 08/18/2017 0754       Results for RACHARD, ISIDRO (MRN [id number]) as of 03/31/2018 09:53  Ref. Range 02/16/2018 08:01 03/29/2018 08:49  Prostate Specific Ag, Serum Latest Ref Range: 0.0 - 4.0 ng/mL 3.0 2.0        ASSESSMENT AND PLAN:   57 year old man with:  1. Castration-resistant prostate cancer with limited disease to the bone diagnosed in 2018 after his initial diagnosis prostate cancer 2008.    He is currently on Zytiga with prednisone without any major complications related to this treatment.  His PSA continues to be under excellent control and has declined to 2.0.  Risks and benefits of  continuing this treatment was reviewed today and is agreeable to continue.  Long-term complications associated with this therapy was reviewed again he is agreeable to remain on it.  We will repeat imaging studies in 6 months or sooner if needed 2.    2.  Right hip metastatic lesion: No recent pain or discomfort.  Status post radiation therapy.   3. Androgen depravation: Risks and benefits of continuing androgen deprivation was reviewed today and he will receive Lupron with the next visit.  4. Bone health: He is currently on calcium and vitamin D without any issues.  We will consider Xgeva after obtaining dental clearance  and if develops any worsening bony metastasis.  5.  Hypertension: Blood pressure remains in normal range without any issues.  6. Follow-up: Will be in 8 weeks to follow his progress.  15  minutes was spent with the patient face-to-face today.  More than 50% of time was dedicated to cussing the natural course of this disease, reviewing his laboratory data and coordinating future plan of care.   Zola Button, MD 8/8/201910:55 AM

## 2018-04-01 MED FILL — predniSONE 5 MG TABS: 5 | 30 days supply | Qty: 30 | Fill #0

## 2018-04-14 ENCOUNTER — Other Ambulatory Visit: Payer: Self-pay | Admitting: Oncology

## 2018-04-14 DIAGNOSIS — C61 Malignant neoplasm of prostate: Secondary | ICD-10-CM

## 2018-04-14 MED FILL — ABIRATERONE ACETATE 250 MG: 250 | 30 days supply | Qty: 120 | Fill #0

## 2018-05-09 ENCOUNTER — Other Ambulatory Visit: Payer: Self-pay | Admitting: Oncology

## 2018-05-09 DIAGNOSIS — C61 Malignant neoplasm of prostate: Secondary | ICD-10-CM

## 2018-05-09 MED FILL — predniSONE 5 MG TABS: 5 | 30 days supply | Qty: 30 | Fill #1

## 2018-05-13 MED FILL — ABIRATERONE ACETATE 250 MG: 250 | 30 days supply | Qty: 120 | Fill #0

## 2018-06-03 ENCOUNTER — Other Ambulatory Visit: Payer: Self-pay | Admitting: Oncology

## 2018-06-03 DIAGNOSIS — C61 Malignant neoplasm of prostate: Secondary | ICD-10-CM

## 2018-06-06 MED FILL — predniSONE 5 MG TABS: 5 | 30 days supply | Qty: 30 | Fill #2

## 2018-06-08 ENCOUNTER — Inpatient Hospital Stay: Payer: BLUE CROSS/BLUE SHIELD | Attending: Oncology

## 2018-06-08 DIAGNOSIS — C61 Malignant neoplasm of prostate: Secondary | ICD-10-CM

## 2018-06-08 DIAGNOSIS — C7951 Secondary malignant neoplasm of bone: Secondary | ICD-10-CM | POA: Diagnosis present

## 2018-06-08 DIAGNOSIS — Z5111 Encounter for antineoplastic chemotherapy: Secondary | ICD-10-CM | POA: Insufficient documentation

## 2018-06-08 LAB — CBC WITH DIFFERENTIAL (CANCER CENTER ONLY)
ABS IMMATURE GRANULOCYTES: 0.01 10*3/uL (ref 0.00–0.07)
Basophils Absolute: 0 10*3/uL (ref 0.0–0.1)
Basophils Relative: 1 %
Eosinophils Absolute: 0.2 10*3/uL (ref 0.0–0.5)
Eosinophils Relative: 3 %
HCT: 43.5 % (ref 39.0–52.0)
HEMOGLOBIN: 14.8 g/dL (ref 13.0–17.0)
Immature Granulocytes: 0 %
LYMPHS PCT: 39 %
Lymphs Abs: 2 10*3/uL (ref 0.7–4.0)
MCH: 29.6 pg (ref 26.0–34.0)
MCHC: 34 g/dL (ref 30.0–36.0)
MCV: 87 fL (ref 80.0–100.0)
MONO ABS: 0.5 10*3/uL (ref 0.1–1.0)
MONOS PCT: 10 %
NEUTROS ABS: 2.5 10*3/uL (ref 1.7–7.7)
NEUTROS PCT: 47 %
Platelet Count: 220 10*3/uL (ref 150–400)
RBC: 5 MIL/uL (ref 4.22–5.81)
RDW: 12 % (ref 11.5–15.5)
WBC Count: 5.2 10*3/uL (ref 4.0–10.5)
nRBC: 0 % (ref 0.0–0.2)

## 2018-06-08 LAB — CMP (CANCER CENTER ONLY)
ALT: 26 U/L (ref 0–44)
AST: 28 U/L (ref 15–41)
Albumin: 4.1 g/dL (ref 3.5–5.0)
Alkaline Phosphatase: 97 U/L (ref 38–126)
Anion gap: 10 (ref 5–15)
BUN: 18 mg/dL (ref 6–20)
CHLORIDE: 106 mmol/L (ref 98–111)
CO2: 27 mmol/L (ref 22–32)
CREATININE: 0.91 mg/dL (ref 0.61–1.24)
Calcium: 9.8 mg/dL (ref 8.9–10.3)
GFR, Estimated: >60 mL/min (ref >60)
Glucose, Bld: 96 mg/dL (ref 70–99)
Potassium: 4.6 mmol/L (ref 3.5–5.1)
SODIUM: 143 mmol/L (ref 135–145)
Total Bilirubin: 1.8 mg/dL — ABNORMAL HIGH (ref 0.3–1.2)
Total Protein: 7.3 g/dL (ref 6.5–8.1)

## 2018-06-09 LAB — PROSTATE-SPECIFIC AG, SERUM (LABCORP): Prostate Specific Ag, Serum: 0.9 ng/mL (ref 0.0–4.0)

## 2018-06-10 ENCOUNTER — Inpatient Hospital Stay: Payer: BLUE CROSS/BLUE SHIELD

## 2018-06-10 ENCOUNTER — Telehealth: Payer: Self-pay | Admitting: Oncology

## 2018-06-10 ENCOUNTER — Inpatient Hospital Stay: Payer: BLUE CROSS/BLUE SHIELD | Admitting: Oncology

## 2018-06-10 VITALS — BP 133/84 | HR 60 | Temp 97.6°F | Resp 17 | Ht 68.0 in | Wt 166.1 lb

## 2018-06-10 DIAGNOSIS — C61 Malignant neoplasm of prostate: Secondary | ICD-10-CM

## 2018-06-10 DIAGNOSIS — I10 Essential (primary) hypertension: Secondary | ICD-10-CM

## 2018-06-10 DIAGNOSIS — C7951 Secondary malignant neoplasm of bone: Secondary | ICD-10-CM | POA: Diagnosis not present

## 2018-06-10 DIAGNOSIS — E291 Testicular hypofunction: Secondary | ICD-10-CM

## 2018-06-10 MED ORDER — LEUPROLIDE ACETATE (3 MONTH) 22.5 MG IM KIT
22.5000 mg | PACK | Freq: Once | INTRAMUSCULAR | Status: AC
  Administered 2018-06-10: 22.5 mg via INTRAMUSCULAR

## 2018-06-10 NOTE — Patient Instructions (Signed)
Leuprolide depot injection What is this medicine? LEUPROLIDE (loo PROE lide) is a man-made protein that acts like a natural hormone in the body. It decreases testosterone in men and decreases estrogen in women. In men, this medicine is used to treat advanced prostate cancer. In women, some forms of this medicine may be used to treat endometriosis, uterine fibroids, or other male hormone-related problems. This medicine may be used for other purposes; ask your health care provider or pharmacist if you have questions. What should I tell my health care provider before I take this medicine? They need to know if you have any of these conditions: -diabetes -heart disease or previous heart attack -high blood pressure -high cholesterol -osteoporosis -pain or difficulty passing urine -spinal cord metastasis -stroke -tobacco smoker -unusual vaginal bleeding (women) -an unusual or allergic reaction to leuprolide, benzyl alcohol, other medicines, foods, dyes, or preservatives -pregnant or trying to get pregnant -breast-feeding How should I use this medicine? This medicine is for injection into a muscle or for injection under the skin. It is given by a health care professional in a hospital or clinic setting. The specific product will determine how it will be given to you. Make sure you understand which product you receive and how often you will receive it. Talk to your pediatrician regarding the use of this medicine in children. Special care may be needed. Overdosage: If you think you have taken too much of this medicine contact a poison control center or emergency room at once. NOTE: This medicine is only for you. Do not share this medicine with others. What if I miss a dose? It is important not to miss a dose. Call your doctor or health care professional if you are unable to keep an appointment. Depot injections: Depot injections are given either once-monthly, every 12 weeks, every 16 weeks, or  every 24 weeks depending on the product you are prescribed. The product you are prescribed will be based on if you are male or male, and your condition. Make sure you understand your product and dosing. What may interact with this medicine? Do not take this medicine with any of the following medications: -chasteberry This medicine may also interact with the following medications: -herbal or dietary supplements, like black cohosh or DHEA -male hormones, like estrogens or progestins and birth control pills, patches, rings, or injections -male hormones, like testosterone This list may not describe all possible interactions. Give your health care provider a list of all the medicines, herbs, non-prescription drugs, or dietary supplements you use. Also tell them if you smoke, drink alcohol, or use illegal drugs. Some items may interact with your medicine. What should I watch for while using this medicine? Visit your doctor or health care professional for regular checks on your progress. During the first weeks of treatment, your symptoms may get worse, but then will improve as you continue your treatment. You may get hot flashes, increased bone pain, increased difficulty passing urine, or an aggravation of nerve symptoms. Discuss these effects with your doctor or health care professional, some of them may improve with continued use of this medicine. Male patients may experience a menstrual cycle or spotting during the first months of therapy with this medicine. If this continues, contact your doctor or health care professional. What side effects may I notice from receiving this medicine? Side effects that you should report to your doctor or health care professional as soon as possible: -allergic reactions like skin rash, itching or hives, swelling of the   face, lips, or tongue -breathing problems -chest pain -depression or memory disorders -pain in your legs or groin -pain at site where injected or  implanted -severe headache -swelling of the feet and legs -visual changes -vomiting Side effects that usually do not require medical attention (report to your doctor or health care professional if they continue or are bothersome): -breast swelling or tenderness -decrease in sex drive or performance -diarrhea -hot flashes -loss of appetite -muscle, joint, or bone pains -nausea -redness or irritation at site where injected or implanted -skin problems or acne This list may not describe all possible side effects. Call your doctor for medical advice about side effects. You may report side effects to FDA at 1-800-FDA-1088. Where should I keep my medicine? This drug is given in a hospital or clinic and will not be stored at home. NOTE: This sheet is a summary. It may not cover all possible information. If you have questions about this medicine, talk to your doctor, pharmacist, or health care provider.    2016, Elsevier/Gold Standard. (2014-05-04 14:16:23)  

## 2018-06-10 NOTE — Telephone Encounter (Signed)
Scheduled appt per 10/18 los - gave patient AVS and calender per los.  

## 2018-06-10 NOTE — Progress Notes (Signed)
Hematology and Oncology Follow Up Visit  David Huffman [id number] February 17, 1961 57 y.o. 06/10/2018 10:32 AM  CC: David Messick, MD    Principle Diagnosis: 57 year old man with castration-resistant prostate cancer with limited disease.  He was initially diagnosed in 2008 with Gleason score of 7 and a PSA of 31.   Prior Therapy: 1. Status post prostatectomy done in 2008, pathology revealing T2c N0 with 0 out of 12 lymph nodes involved.  2. The patient continued to have rise in his PSA up to 52 after prostatectomy without any measurable disease.  Lupron was started at that time. 3.     Casodex 50 mg daily added in 05/2011. Therapy discontinued in March 2018 because of progression of disease. 4.     Status post radiation therapy to the pelvic bone completed in June 2019 he received 50 Gy in 10 fractions.   Current therapy:  He is on hormonal deprivation, Lupron 22.5 mg every 3 to 4  months on a continuous basis started in 2008.    Zytiga 1000 mg daily with prednisone 5 mg daily since May 2018.   Interim History:  David Huffman returns today for repeat evaluation.  Since the last visit, he reports no major changes in his health.  Continues to be active and attends to activities of daily living.  Continues to run regularly and works full-time.  His exercise capacity has been excellent.  He denies any complications related to Zytiga or prednisone.  He denies any hip pain or discomfort.  He denies any edema or changes in his activity level.  He denies any fatigue or tiredness.  He does not report any headaches or blurry vision.  He denies any changes in his mental status or confusion.  He denied  fevers, chills, sweats.  Does not report any chest pain, palpitation orthopnea.  He does not report any cough, wheezing or hemoptysis.  He denies any nausea or vomiting or abdominal pain.  Denies any change in his bowel habits.  Does not report any frequency urgency or hesitancy.  He does not report any bone pain or  pathological fractures.  He does not report any skin rashes or lesions.  He does not report any easy bruising or bleeding.  He denied any lymphadenopathy.  Denies any skin changes or rashes.  He denies any diarrhea or depression.  Rest of his review of systems is negative.  Medications: I have reviewed the patient's current medications. Current Outpatient Medications  Medication Sig Dispense Refill  . abiraterone acetate (ZYTIGA) 250 MG tablet TAKE 4 TABLETS (1000MG ) BY MOUTH DAILY ON AN EMPTY STOMACH 1 HOUR BEFORE OR 2 HOURS AFTER A MEAL 120 tablet 0  . Ascorbic Acid (VITAMIN C) 1000 MG tablet Take 1,000 mg by mouth daily.    Marland Kitchen b complex vitamins tablet Take 1 tablet by mouth daily.    . Calcium Carbonate-Vitamin D (CALCIUM + D PO) Take 1 tablet by mouth daily.    Marland Kitchen leuprolide (LUPRON) 22.5 MG injection Inject 22.5 mg into the muscle every 3 (three) months.    . Melatonin 10 MG CAPS Take 12 mg 2 (two) times daily at 8 am and 10 pm by mouth.    . Multiple Vitamins-Minerals (MULTIVITAMIN PO) Take 1 tablet by mouth daily.    . Omega-3 Fatty Acids (FISH OIL) 1000 MG CAPS Take 1,000 mg by mouth daily.    . predniSONE (DELTASONE) 5 MG tablet Take 1 tablet (5 mg total) by mouth daily with breakfast. 90  tablet 3  . VITAMIN D, ERGOCALCIFEROL, PO Take 1 tablet by mouth daily.     No current facility-administered medications for this visit.     Allergies: No Known Allergies  Past Medical History, Surgical history, Social history, and Family History updated without any changes today.  Physical Exam:  Blood pressure 133/84, pulse 60, temperature 97.6 F (36.4 C), temperature source Oral, resp. rate 17, height 5\' 8"  (1.727 m), weight 166 lb 1.6 oz (75.3 kg), SpO2 100 %.    ECOG: 0   General appearance: Comfortable appearing without any discomfort Head: Normocephalic without any trauma Oropharynx: Mucous membranes are moist and pink without any thrush or ulcers. Eyes: Pupils are equal and round  reactive to light. Lymph nodes: No cervical, supraclavicular, inguinal or axillary lymphadenopathy.   Heart:regular rate and rhythm.  S1 and S2 without leg edema. Lung: Clear without any rhonchi or wheezes.  No dullness to percussion. Abdomin: Soft, nontender, nondistended with good bowel sounds.  No hepatosplenomegaly. Musculoskeletal: No joint deformity or effusion.  Full range of motion noted. Neurological: No deficits noted on motor, sensory and deep tendon reflex exam. Skin: No petechial rash or dryness.  Appeared moist.  Psychiatric: Mood and affect appeared appropriate.     Lab Results: Lab Results  Component Value Date   WBC 5.2 06/08/2018   HGB 14.8 06/08/2018   HCT 43.5 06/08/2018   MCV 87.0 06/08/2018   PLT 220 06/08/2018     Chemistry      Component Value Date/Time   NA 143 06/08/2018 0809   NA 142 08/18/2017 0754   K 4.6 06/08/2018 0809   K 4.2 08/18/2017 0754   CL 106 06/08/2018 0809   CL 106 01/03/2013 0916   CO2 27 06/08/2018 0809   CO2 25 08/18/2017 0754   BUN 18 06/08/2018 0809   BUN 14.0 08/18/2017 0754   CREATININE 0.91 06/08/2018 0809   CREATININE 1.0 08/18/2017 0754      Component Value Date/Time   CALCIUM 9.8 06/08/2018 0809   CALCIUM 9.5 08/18/2017 0754   ALKPHOS 97 06/08/2018 0809   ALKPHOS 106 08/18/2017 0754   AST 28 06/08/2018 0809   AST 31 08/18/2017 0754   ALT 26 06/08/2018 0809   ALT 39 08/18/2017 0754   BILITOT 1.8 (H) 06/08/2018 0809   BILITOT 1.26 (H) 08/18/2017 0754         Results for David Huffman (MRN [id number]) as of 06/10/2018 10:35  Ref. Range 03/29/2018 08:49 06/08/2018 08:09  Prostate Specific Ag, Serum Latest Ref Range: 0.0 - 4.0 ng/mL 2.0 0.9      ASSESSMENT AND PLAN:   57 year old man with:  1. Castration-resistant prostate cancer documented in 2018 with limited disease to the bone.  He was initially diagnosed with organ confined disease in 2008.  He continues to tolerate Zytiga without any major  complications with PSA continues to respond.  Currently at 0.9 without any new cancer related symptoms.  Risks and benefits of continuing this therapy was reviewed today. Long-term complications associated with it was discussed.  These issues include hypertension, edema and adrenal insufficiency.  After discussion today, the plan is to continue with the same dose and schedule and repeat imaging studies in January 2020.  He is agreeable to proceed with this plan.    2.  Right hip metastatic lesion: He is status post radiation therapy without any recurrent issues at this time.   3. Androgen depravation: He continues to be on Lupron every 3 months.  Long-term complication associated with this therapy was reviewed today which include osteoporosis and potential cardiac complications.  He is agreeable to continue.  4. Bone health: He remains on calcium and vitamin D.  We will repeat bone scan and assess his disease burden and consider Xgeva after obtaining dental clearance.  5.  Hypertension: His blood pressure is within normal range at this time.  6.  Genetic counseling: Given his young age and advanced prostate cancer he would qualify for genetic testing.  We have discussed this with him today and he is considering that option.  7. Follow-up: Will be in 3 months for repeat evaluation including restaging work-up.  25 minutes was spent with the patient face-to-face today.  More than 50% of time was dedicated to reviewing his disease status, laboratory data, treatment options and coordinating plan of care.   Zola Button, MD 10/18/201910:32 AM

## 2018-06-13 MED FILL — ABIRATERONE ACETATE 250 MG: 250 | 30 days supply | Qty: 120 | Fill #0

## 2018-07-04 ENCOUNTER — Other Ambulatory Visit: Payer: Self-pay | Admitting: Oncology

## 2018-07-04 DIAGNOSIS — C61 Malignant neoplasm of prostate: Secondary | ICD-10-CM

## 2018-07-07 MED FILL — predniSONE 5 MG TABS: 5 | 30 days supply | Qty: 30 | Fill #3

## 2018-07-12 MED FILL — ABIRATERONE ACETATE 250 MG: 250 | 30 days supply | Qty: 120 | Fill #0

## 2018-08-05 ENCOUNTER — Other Ambulatory Visit: Payer: Self-pay | Admitting: Oncology

## 2018-08-05 DIAGNOSIS — C61 Malignant neoplasm of prostate: Secondary | ICD-10-CM

## 2018-08-12 MED FILL — predniSONE 5 MG TABS: 5 | 30 days supply | Qty: 30 | Fill #4

## 2018-08-12 MED FILL — ABIRATERONE ACETATE 250 MG: 250 | 30 days supply | Qty: 120 | Fill #0

## 2018-09-02 ENCOUNTER — Other Ambulatory Visit: Payer: Self-pay | Admitting: Oncology

## 2018-09-02 DIAGNOSIS — C61 Malignant neoplasm of prostate: Secondary | ICD-10-CM

## 2018-09-09 MED FILL — predniSONE 5 MG TABS: 5 | 30 days supply | Qty: 30 | Fill #5

## 2018-09-09 MED FILL — ABIRATERONE ACETATE 250 MG: 250 | 30 days supply | Qty: 120 | Fill #0

## 2018-09-14 ENCOUNTER — Inpatient Hospital Stay: Payer: BLUE CROSS/BLUE SHIELD | Attending: Oncology

## 2018-09-14 ENCOUNTER — Ambulatory Visit (HOSPITAL_COMMUNITY)
Admission: RE | Admit: 2018-09-14 | Discharge: 2018-09-14 | Disposition: A | Discharge status: Home / Self Care | Payer: BLUE CROSS/BLUE SHIELD | Source: Ambulatory Visit | Attending: Oncology | Admitting: Oncology

## 2018-09-14 DIAGNOSIS — C61 Malignant neoplasm of prostate: Secondary | ICD-10-CM | POA: Diagnosis not present

## 2018-09-14 DIAGNOSIS — Z5111 Encounter for antineoplastic chemotherapy: Secondary | ICD-10-CM | POA: Diagnosis present

## 2018-09-14 DIAGNOSIS — C7951 Secondary malignant neoplasm of bone: Secondary | ICD-10-CM | POA: Diagnosis present

## 2018-09-14 LAB — CBC WITH DIFFERENTIAL (CANCER CENTER ONLY)
Abs Immature Granulocytes: 0.01 10*3/uL (ref 0.00–0.07)
BASOS ABS: 0 10*3/uL (ref 0.0–0.1)
BASOS PCT: 1 %
EOS ABS: 0.1 10*3/uL (ref 0.0–0.5)
EOS PCT: 4 %
HCT: 41.3 % (ref 39.0–52.0)
Hemoglobin: 14.1 g/dL (ref 13.0–17.0)
Immature Granulocytes: 0 %
Lymphocytes Relative: 45 %
Lymphs Abs: 1.7 10*3/uL (ref 0.7–4.0)
MCH: 29.7 pg (ref 26.0–34.0)
MCHC: 34.1 g/dL (ref 30.0–36.0)
MCV: 87.1 fL (ref 80.0–100.0)
Monocytes Absolute: 0.4 10*3/uL (ref 0.1–1.0)
Monocytes Relative: 11 %
NRBC: 0 % (ref 0.0–0.2)
Neutro Abs: 1.5 10*3/uL — ABNORMAL LOW (ref 1.7–7.7)
Neutrophils Relative %: 39 %
PLATELETS: 210 10*3/uL (ref 150–400)
RBC: 4.74 MIL/uL (ref 4.22–5.81)
RDW: 11.9 % (ref 11.5–15.5)
WBC: 3.8 10*3/uL — AB (ref 4.0–10.5)

## 2018-09-14 LAB — CMP (CANCER CENTER ONLY)
ALT: 22 U/L (ref 0–44)
ANION GAP: 10 (ref 5–15)
AST: 24 U/L (ref 15–41)
Albumin: 3.9 g/dL (ref 3.5–5.0)
Alkaline Phosphatase: 94 U/L (ref 38–126)
BILIRUBIN TOTAL: 1.1 mg/dL (ref 0.3–1.2)
BUN: 13 mg/dL (ref 6–20)
CO2: 27 mmol/L (ref 22–32)
Calcium: 9.6 mg/dL (ref 8.9–10.3)
Chloride: 106 mmol/L (ref 98–111)
Creatinine: 0.85 mg/dL (ref 0.61–1.24)
GFR, Est AFR Am: >60 mL/min (ref >60)
GFR, Estimated: >60 mL/min (ref >60)
GLUCOSE: 94 mg/dL (ref 70–99)
POTASSIUM: 4.6 mmol/L (ref 3.5–5.1)
SODIUM: 143 mmol/L (ref 135–145)
TOTAL PROTEIN: 7 g/dL (ref 6.5–8.1)

## 2018-09-14 MED ORDER — SODIUM CHLORIDE (PF) 0.9 % IJ SOLN
INTRAMUSCULAR | Status: AC
  Filled 2018-09-14: qty 50

## 2018-09-14 MED ORDER — TECHNETIUM TC 99M MEDRONATE IV KIT
20.7000 | PACK | Freq: Once | INTRAVENOUS | Status: AC | PRN
  Administered 2018-09-14: 20.7 via INTRAVENOUS

## 2018-09-14 MED ORDER — IOHEXOL 300 MG/ML  SOLN
100.0000 mL | Freq: Once | INTRAMUSCULAR | Status: AC | PRN
  Administered 2018-09-14: 100 mL via INTRAVENOUS

## 2018-09-15 LAB — PROSTATE-SPECIFIC AG, SERUM (LABCORP): Prostate Specific Ag, Serum: 0.4 ng/mL (ref 0.0–4.0)

## 2018-09-16 ENCOUNTER — Telehealth: Payer: Self-pay | Admitting: Oncology

## 2018-09-16 ENCOUNTER — Inpatient Hospital Stay: Payer: BLUE CROSS/BLUE SHIELD

## 2018-09-16 ENCOUNTER — Inpatient Hospital Stay (HOSPITAL_BASED_OUTPATIENT_CLINIC_OR_DEPARTMENT_OTHER): Payer: BLUE CROSS/BLUE SHIELD | Admitting: Oncology

## 2018-09-16 VITALS — BP 132/87 | HR 94 | Temp 97.7°F | Resp 18 | Ht 68.0 in | Wt 164.7 lb

## 2018-09-16 DIAGNOSIS — C7951 Secondary malignant neoplasm of bone: Secondary | ICD-10-CM | POA: Diagnosis not present

## 2018-09-16 DIAGNOSIS — C61 Malignant neoplasm of prostate: Secondary | ICD-10-CM

## 2018-09-16 DIAGNOSIS — E291 Testicular hypofunction: Secondary | ICD-10-CM | POA: Diagnosis not present

## 2018-09-16 DIAGNOSIS — Z9079 Acquired absence of other genital organ(s): Secondary | ICD-10-CM | POA: Diagnosis not present

## 2018-09-16 MED ORDER — LEUPROLIDE ACETATE (3 MONTH) 22.5 MG IM KIT
22.5000 mg | PACK | Freq: Once | INTRAMUSCULAR | Status: AC
  Administered 2018-09-16: 22.5 mg via INTRAMUSCULAR

## 2018-09-16 MED ORDER — CALCIUM CARBONATE-VITAMIN D 500-200 MG-UNIT PO TABS: 1.0000 | ORAL_TABLET | Freq: Two times a day (BID) | ORAL | 3 refills | Status: DC

## 2018-09-16 MED FILL — OYSTER SHELL CALCIUM-VIT D: 500-200 | 30 days supply | Qty: 60 | Fill #0

## 2018-09-16 NOTE — Progress Notes (Signed)
Hematology and Oncology Follow Up Visit  David Huffman [id number] 1960/11/20 58 y.o. 09/16/2018 11:58 AM  CC: David Messick, MD    Principle Diagnosis: 58 year old man with advanced prostate cancer with limited disease to the bone that is currently castration-resistant he presented in 2008 with Gleason score of 7 and a PSA of 31.   Prior Therapy: 1. Status post prostatectomy done in 2008, pathology revealing T2c N0 with 0 out of 12 lymph nodes involved.  2. The patient continued to have rise in his PSA up to 52 after prostatectomy without any measurable disease.  Lupron was started at that time. 3.     Casodex 50 mg daily added in 05/2011. Therapy discontinued in March 2018 because of progression of disease. 4.     Status post radiation therapy to the pelvic bone completed in June 2019 he received 50 Gy in 10 fractions.   Current therapy:   Lupron 22.5 mg every 3 months started in 2018.  Zytiga 1000 mg daily with prednisone 5 mg daily since May 2018.   Interim History:  David Huffman is here for a follow-up visit.  Since the last visit, since her last visit, he reports no major changes in his health.  He continues to be active and attends to activities of daily living.  He exercises regularly including working a physical job as well as regular workouts.  He denies any complications related to [name] including no excess fatigue, edema or bone pain.  His appetite and performance status remain excellent.  He denies any hot flashes or weight gain.  He does not report any headaches or blurry vision.  He denies any syncope or seizures.  He denied  fevers, chills, sweats.  Does not report any chest pain, palpitation orthopnea.  He does not report any cough, wheezing or hemoptysis.  He denies any nausea or vomiting or early satiety.  Denies any constipation or diarrhea.  Does not report any frequency urgency or hesitancy.  He does not report any arthralgias or myalgias he does not report any skin rashes or  lesions.  He does not report any bleeding or clotting tendency.  He denied any mood changes.  Rest of his review of systems is negative.  Medications: I have reviewed the patient's current medications. Current Outpatient Medications  Medication Sig Dispense Refill  . abiraterone acetate (ZYTIGA) 250 MG tablet TAKE 4 TABLETS (1000MG ) BY MOUTH DAILY ON AN EMPTY STOMACH 1 HOUR BEFORE OR 2 HOURS AFTER A MEAL 120 tablet 0  . Ascorbic Acid (VITAMIN C) 1000 MG tablet Take 1,000 mg by mouth daily.    Marland Kitchen b complex vitamins tablet Take 1 tablet by mouth daily.    . Calcium Carbonate-Vitamin D (CALCIUM + D PO) Take 1 tablet by mouth daily.    Marland Kitchen leuprolide (LUPRON) 22.5 MG injection Inject 22.5 mg into the muscle every 3 (three) months.    . Melatonin 10 MG CAPS Take 12 mg 2 (two) times daily at 8 am and 10 pm by mouth.    . Multiple Vitamins-Minerals (MULTIVITAMIN PO) Take 1 tablet by mouth daily.    . Omega-3 Fatty Acids (FISH OIL) 1000 MG CAPS Take 1,000 mg by mouth daily.    . predniSONE (DELTASONE) 5 MG tablet Take 1 tablet (5 mg total) by mouth daily with breakfast. 90 tablet 3  . VITAMIN D, ERGOCALCIFEROL, PO Take 1 tablet by mouth daily.     No current facility-administered medications for this visit.  Allergies: No Known Allergies  Past Medical History, Surgical history, Social history, and Family History updated without any changes today.  Physical Exam:  Blood pressure 132/87, pulse 94, temperature 97.7 F (36.5 C), temperature source Oral, resp. rate 18, height 5\' 8"  (1.727 m), weight 164 lb 11.2 oz (74.7 kg), SpO2 100 %.     ECOG: 0    General appearance: Alert, awake without any distress. Head: Atraumatic without abnormalities Oropharynx: Without any thrush or ulcers. Eyes: No scleral icterus. Lymph nodes: No lymphadenopathy noted in the cervical, supraclavicular, or axillary nodes Heart:regular rate and rhythm, without any murmurs or gallops.   Lung: Clear to auscultation  without any rhonchi, wheezes or dullness to percussion. Abdomin: Soft, nontender without any shifting dullness or ascites. Musculoskeletal: No clubbing or cyanosis. Neurological: No motor or sensory deficits. Skin: No rashes or lesions.      Lab Results: Lab Results  Component Value Date   WBC 3.8 (L) 09/14/2018   HGB 14.1 09/14/2018   HCT 41.3 09/14/2018   MCV 87.1 09/14/2018   PLT 210 09/14/2018     Chemistry      Component Value Date/Time   NA 143 09/14/2018 0753   NA 142 08/18/2017 0754   K 4.6 09/14/2018 0753   K 4.2 08/18/2017 0754   CL 106 09/14/2018 0753   CL 106 01/03/2013 0916   CO2 27 09/14/2018 0753   CO2 25 08/18/2017 0754   BUN 13 09/14/2018 0753   BUN 14.0 08/18/2017 0754   CREATININE 0.85 09/14/2018 0753   CREATININE 1.0 08/18/2017 0754      Component Value Date/Time   CALCIUM 9.6 09/14/2018 0753   CALCIUM 9.5 08/18/2017 0754   ALKPHOS 94 09/14/2018 0753   ALKPHOS 106 08/18/2017 0754   AST 24 09/14/2018 0753   AST 31 08/18/2017 0754   ALT 22 09/14/2018 0753   ALT 39 08/18/2017 0754   BILITOT 1.1 09/14/2018 0753   BILITOT 1.26 (H) 08/18/2017 0754       Results for David Huffman (MRN [id number]) as of 09/16/2018 12:00  Ref. Range 06/08/2018 08:09 09/14/2018 07:53  Prostate Specific Ag, Serum Latest Ref Range: 0.0 - 4.0 ng/mL 0.9 0.4     EXAM: CT ABDOMEN AND PELVIS WITH CONTRAST  TECHNIQUE: Multidetector CT imaging of the abdomen and pelvis was performed using the standard protocol following bolus administration of intravenous contrast.  CONTRAST:  16mL OMNIPAQUE IOHEXOL 300 MG/ML  SOLN  COMPARISON:  Bone scan 09/30/2017 and CT abdomen pelvis 09/30/2017.  FINDINGS: Lower chest: Lung bases are clear. Heart size normal. No pericardial or pleural effusion. Distal esophagus is grossly unremarkable.  Hepatobiliary: Subcentimeter low-attenuation lesion in segment 4 of the liver is too small to characterize. Liver and gallbladder  are otherwise unremarkable. No biliary ductal dilatation.  Pancreas: Negative.  Spleen: Negative.  Adrenals/Urinary Tract: Adrenal glands and kidneys are unremarkable. Ureters are decompressed. Bladder is grossly unremarkable.  Stomach/Bowel: Stomach, small bowel, appendix and colon are unremarkable.  Vascular/Lymphatic: Circumaortic left renal vein. Vascular structures are otherwise unremarkable. No pathologically enlarged lymph nodes.  Reproductive: Prostatectomy.  Other: No free fluid.  Mesenteries and peritoneum are unremarkable.  Musculoskeletal: Sclerotic lesions are again seen in the right pubic bone and right inferior pubic ramus. No new lesions.  IMPRESSION: Osseous metastatic disease is stable. No additional evidence of metastatic disease in the abdomen or pelvis.     ASSESSMENT AND PLAN:   58 year old man with:  1.  Advanced prostate cancer with limited disease to  the bone that is currently castration-resistant.   He has tolerated Zytiga very well at this time without any major complications.  His PSA continues to decline currently at 0.4.  CT scan and bone scan were also reviewed and showed no evidence of progressive disease.  He did have an abnormal uptake in his ribs that appear to be more likely traumatic than disease progression.  Risks and benefits of continuing this therapy was discussed today and is agreeable to continue.  Different salvage therapy may be needed in the future if he develops progression of disease.    2.  Right hip metastatic lesion: No pain or discomfort noted at this time.  Status post radiation in the past.   3. Androgen depravation: Long-term complication associated with androgen deprivation was reviewed today.  These would include osteoporosis, weight gain and sexual dysfunction.  Is agreeable to continue I will receive Lupron today and every 3 months.  4. Bone health: This was discussed today in details today.  He has  not been taking calcium regularly and I prescribed that to him today.  I will also obtain a bone density scan and we have discussed proceeding with Xgeva.  Complication associated with this medication including hypocalcemia and osteonecrosis of the jaw.  He will undergo dental clearance in the near future and will consider starting his treatment either in April or July of this year.  5. Follow-up: Will be in 3 months for a repeat evaluation  25 minutes was spent with the patient face-to-face today.  More than 50% of time was dedicated to discussing his disease status, reviewing imaging studies and coordinating future plan of care.    Zola Button, MD 1/24/202011:58 AM

## 2018-09-16 NOTE — Telephone Encounter (Signed)
Scheduled appt per 01/24 los.  Printed calendar and avs.  Patient stated he was going to call and get his bone density appt scheduled so he can make sure he has it before his next doctor visit.

## 2018-10-05 ENCOUNTER — Other Ambulatory Visit: Payer: Self-pay | Admitting: Oncology

## 2018-10-05 DIAGNOSIS — C61 Malignant neoplasm of prostate: Secondary | ICD-10-CM

## 2018-10-13 MED FILL — ABIRATERONE ACETATE 250 MG: 250 | 30 days supply | Qty: 120 | Fill #0

## 2018-10-13 MED FILL — predniSONE 5 MG TABS: 5 | 30 days supply | Qty: 30 | Fill #6

## 2018-11-03 ENCOUNTER — Other Ambulatory Visit: Payer: Self-pay | Admitting: Oncology

## 2018-11-03 MED ORDER — CALCIUM CARBONATE-VITAMIN D 500-200 MG-UNIT PO TABS: 1.0000 | ORAL_TABLET | Freq: Two times a day (BID) | ORAL | 3 refills | Status: DC

## 2018-11-03 MED FILL — OYSTER SHELL CALCIUM-VIT D: 500-200 | 30 days supply | Qty: 60 | Fill #0

## 2018-11-07 ENCOUNTER — Other Ambulatory Visit: Payer: Self-pay | Admitting: Oncology

## 2018-11-07 DIAGNOSIS — C61 Malignant neoplasm of prostate: Secondary | ICD-10-CM

## 2018-11-10 MED FILL — ABIRATERONE ACETATE 250 MG: 250 | 30 days supply | Qty: 120 | Fill #0

## 2018-11-10 MED FILL — predniSONE 5 MG TABS: 5 | 30 days supply | Qty: 30 | Fill #7

## 2018-11-16 IMAGING — NM NM BONE WHOLE BODY
2 series · 2 of 2 positions shown · non-contrast
Comparison: CT from earlier in the same day as well as prior bone
scan from 11/05/2015

CLINICAL DATA: Prostate carcinoma

EXAM:
NUCLEAR MEDICINE WHOLE BODY BONE SCAN
TECHNIQUE: Whole body anterior and posterior images were obtained approximately
3 hours after intravenous injection of radiopharmaceutical.
RADIOPHARMACEUTICALS:  21.2 mCi Dechnetium-FFm MDP IV

[Series 1: wbr_bone_40 whole body · 2.66mm/px · 1 of 1 slices shown (1 of 2)]
[im 1/1]
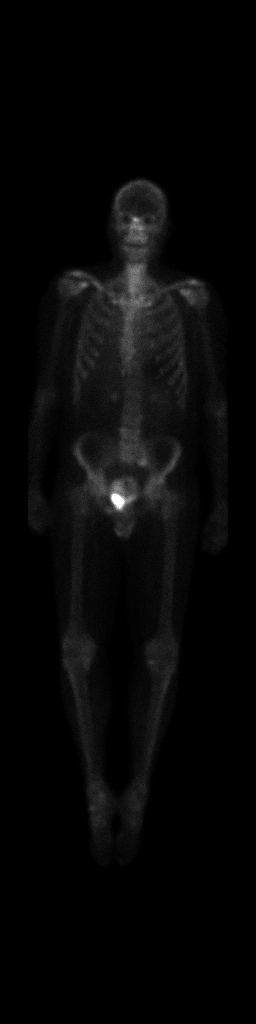

[Series 1: wbr_bone_40 whole body · 2.66mm/px · 1 of 1 slices shown (2 of 2)]
[im 1/1]
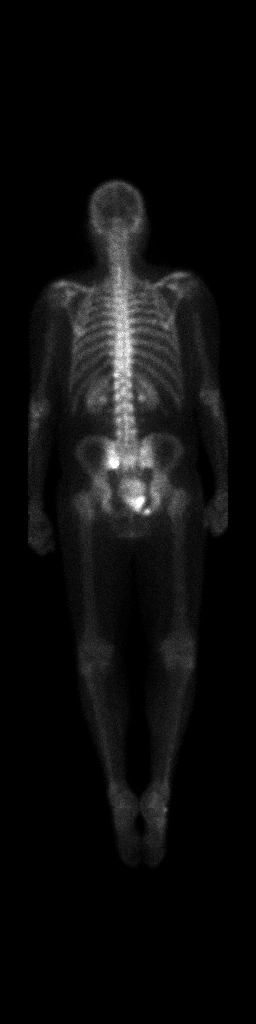

[2 of 2 positions shown; findings below may reference images not displayed]

FINDINGS: There is adequate uptake of radioactive tracer throughout the bony
skeleton. Foci of increased activity are identified in the inferior
pubic ramus on the right as well as the superior pubic ramus
extending into the pubic symphysis. A focus of increased activity is
noted along the inferior aspect of the left sacrum adjacent to the
sacroiliac joint. When compared with the recent CT examination the
changes in the sacrum appear to be related to an undisplaced sacral
fracture. These changes are new from the prior exam. The areas of
increased activity involving the pubic rami on the right demonstrate
a somewhat more mottled appearance on the current CT exam as well as
a linear lucency in the midportion of the superior pubic ramus. This
may represent an acute fracture superimposed over the previous mixed
lytic and sclerotic lesion. The possibility of some progression of
the lesion would deserve consideration as the degree of increased
activity is greater than that seen on prior exam.

Bilateral renal activity is identified. No other significant areas
of increased activity to suggest metastatic disease are noted.
IMPRESSION: Areas of increased activity in the pelvis as described. The sacral
area on the left appears to be related to a sacral fracture.

The changes in the superior pubic ramus and inferior pubic ramus on
the right may represent an acute fracture adjacent to a mixed lytic
and sclerotic lesion. The possibility of some progression would
deserve consideration however.

No other areas of abnormal increased activity are noted.

## 2018-12-02 ENCOUNTER — Other Ambulatory Visit: Payer: BLUE CROSS/BLUE SHIELD

## 2018-12-06 ENCOUNTER — Other Ambulatory Visit: Payer: Self-pay | Admitting: Oncology

## 2018-12-06 DIAGNOSIS — C61 Malignant neoplasm of prostate: Secondary | ICD-10-CM

## 2018-12-09 ENCOUNTER — Other Ambulatory Visit: Payer: BLUE CROSS/BLUE SHIELD

## 2018-12-09 MED FILL — predniSONE 5 MG TABS: 5 | 30 days supply | Qty: 30 | Fill #8

## 2018-12-09 MED FILL — ABIRATERONE ACETATE 250 MG: 250 | 30 days supply | Qty: 120 | Fill #0

## 2018-12-09 MED FILL — OYSTER SHELL CALCIUM-VIT D: 500-200 | 30 days supply | Qty: 60 | Fill #1

## 2018-12-14 ENCOUNTER — Ambulatory Visit: Payer: BLUE CROSS/BLUE SHIELD

## 2018-12-14 ENCOUNTER — Other Ambulatory Visit: Payer: Self-pay

## 2018-12-14 ENCOUNTER — Ambulatory Visit: Payer: BLUE CROSS/BLUE SHIELD | Admitting: Oncology

## 2018-12-14 ENCOUNTER — Inpatient Hospital Stay: Payer: BLUE CROSS/BLUE SHIELD | Attending: Oncology

## 2018-12-14 DIAGNOSIS — C61 Malignant neoplasm of prostate: Secondary | ICD-10-CM | POA: Insufficient documentation

## 2018-12-14 DIAGNOSIS — Z5111 Encounter for antineoplastic chemotherapy: Secondary | ICD-10-CM | POA: Diagnosis present

## 2018-12-14 DIAGNOSIS — C7951 Secondary malignant neoplasm of bone: Secondary | ICD-10-CM | POA: Insufficient documentation

## 2018-12-14 LAB — CBC WITH DIFFERENTIAL (CANCER CENTER ONLY)
Abs Immature Granulocytes: 0.01 10*3/uL (ref 0.00–0.07)
Basophils Absolute: 0 10*3/uL (ref 0.0–0.1)
Basophils Relative: 1 %
Eosinophils Absolute: 0.2 10*3/uL (ref 0.0–0.5)
Eosinophils Relative: 4 %
HCT: 44.2 % (ref 39.0–52.0)
Hemoglobin: 14.9 g/dL (ref 13.0–17.0)
Immature Granulocytes: 0 %
Lymphocytes Relative: 40 %
Lymphs Abs: 2.1 10*3/uL (ref 0.7–4.0)
MCH: 29.6 pg (ref 26.0–34.0)
MCHC: 33.7 g/dL (ref 30.0–36.0)
MCV: 87.9 fL (ref 80.0–100.0)
Monocytes Absolute: 0.5 10*3/uL (ref 0.1–1.0)
Monocytes Relative: 10 %
Neutro Abs: 2.5 10*3/uL (ref 1.7–7.7)
Neutrophils Relative %: 45 %
Platelet Count: 219 10*3/uL (ref 150–400)
RBC: 5.03 MIL/uL (ref 4.22–5.81)
RDW: 12.5 % (ref 11.5–15.5)
WBC Count: 5.4 10*3/uL (ref 4.0–10.5)
nRBC: 0 % (ref 0.0–0.2)

## 2018-12-14 LAB — CMP (CANCER CENTER ONLY)
ALT: 23 U/L (ref 0–44)
AST: 23 U/L (ref 15–41)
Albumin: 4.1 g/dL (ref 3.5–5.0)
Alkaline Phosphatase: 106 U/L (ref 38–126)
Anion gap: 9 (ref 5–15)
BUN: 18 mg/dL (ref 6–20)
CO2: 26 mmol/L (ref 22–32)
Calcium: 9.3 mg/dL (ref 8.9–10.3)
Chloride: 106 mmol/L (ref 98–111)
Creatinine: 0.93 mg/dL (ref 0.61–1.24)
GFR, Est AFR Am: >60 mL/min (ref >60)
GFR, Estimated: >60 mL/min (ref >60)
Glucose, Bld: 87 mg/dL (ref 70–99)
Potassium: 4 mmol/L (ref 3.5–5.1)
Sodium: 141 mmol/L (ref 135–145)
Total Bilirubin: 1.6 mg/dL — ABNORMAL HIGH (ref 0.3–1.2)
Total Protein: 7.1 g/dL (ref 6.5–8.1)

## 2018-12-15 LAB — PROSTATE-SPECIFIC AG, SERUM (LABCORP): Prostate Specific Ag, Serum: 0.2 ng/mL (ref 0.0–4.0)

## 2018-12-16 ENCOUNTER — Other Ambulatory Visit: Payer: Self-pay | Admitting: Pharmacist

## 2018-12-16 ENCOUNTER — Telehealth: Payer: Self-pay | Admitting: Oncology

## 2018-12-16 ENCOUNTER — Inpatient Hospital Stay (HOSPITAL_BASED_OUTPATIENT_CLINIC_OR_DEPARTMENT_OTHER): Payer: BLUE CROSS/BLUE SHIELD | Admitting: Oncology

## 2018-12-16 ENCOUNTER — Inpatient Hospital Stay: Payer: BLUE CROSS/BLUE SHIELD

## 2018-12-16 ENCOUNTER — Other Ambulatory Visit: Payer: Self-pay

## 2018-12-16 VITALS — BP 127/82 | HR 83 | Temp 97.4°F | Resp 18 | Ht 68.0 in | Wt 163.2 lb

## 2018-12-16 DIAGNOSIS — Z79899 Other long term (current) drug therapy: Secondary | ICD-10-CM

## 2018-12-16 DIAGNOSIS — C61 Malignant neoplasm of prostate: Secondary | ICD-10-CM | POA: Diagnosis not present

## 2018-12-16 DIAGNOSIS — Z923 Personal history of irradiation: Secondary | ICD-10-CM

## 2018-12-16 DIAGNOSIS — E291 Testicular hypofunction: Secondary | ICD-10-CM

## 2018-12-16 DIAGNOSIS — C7951 Secondary malignant neoplasm of bone: Secondary | ICD-10-CM

## 2018-12-16 DIAGNOSIS — Z9079 Acquired absence of other genital organ(s): Secondary | ICD-10-CM

## 2018-12-16 MED ORDER — LEUPROLIDE ACETATE (3 MONTH) 22.5 MG IM KIT
PACK | INTRAMUSCULAR | Status: AC
  Filled 2018-12-16: qty 22.5

## 2018-12-16 MED ORDER — LEUPROLIDE ACETATE (3 MONTH) 22.5 MG IM KIT
22.5000 mg | PACK | Freq: Once | INTRAMUSCULAR | Status: AC
  Administered 2018-12-16: 22.5 mg via INTRAMUSCULAR

## 2018-12-16 NOTE — Progress Notes (Signed)
Hematology and Oncology Follow Up Visit  Ruger Saxer [id number] 05-Jun-1961 58 y.o. 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  He was initially diagnosed 2008 with Gleason score of 7 and a PSA of 31.   Prior Therapy: 1. Status post prostatectomy done in 2008, pathology revealing T2c N0 with 0 out of 12 lymph nodes involved.  2. The patient continued to have rise in his PSA up to 52 after prostatectomy without any measurable disease.  Lupron was started at that time. 3.     Casodex 50 mg daily added in 05/2011. Therapy discontinued in March 2018 because of progression of disease. 4.     Status post radiation therapy to the pelvic bone completed in June 2019 he received 50 Gy in 10 fractions.   Current therapy:   Lupron 22.5 mg every 3 months started in 2018.  Zytiga 1000 mg daily with prednisone 5 mg daily since May 2018.   Interim History:  Mr. Sadowsky returns today for a repeat evaluation.  Since the last visit, he reports no major changes in his health.  He continues to tolerate Zytiga without any recent complications.  He denies any excessive fatigue, tiredness or decline in ability to work.  He denies any bone pain or pathological fractures.  Continues to take calcium although not regularly.  He denies any urination symptoms.  He denies any respiratory complaints or fevers.    He denied any alteration mental status, neuropathy, confusion or dizziness.  Denies any headaches or lethargy.  Denies any night sweats, weight loss or changes in appetite.  Denied orthopnea, dyspnea on exertion or chest discomfort.  Denies shortness of breath, difficulty breathing hemoptysis or cough.  Denies any abdominal distention, nausea, early satiety or dyspepsia.  Denies any hematuria, frequency, dysuria or nocturia.  Denies any skin irritation, dryness or rash.  Denies any ecchymosis  or petechiae.  Denies any lymphadenopathy or clotting.  Denies any heat or cold intolerance.  Denies any anxiety or depression.  Remaining review of system is negative.      Medications: I have reviewed the patient's current medications. Current Outpatient Medications  Medication Sig Dispense Refill  . abiraterone acetate (ZYTIGA) 250 MG tablet TAKE 4 TABLETS (1000MG ) BY MOUTH DAILY ON AN EMPTY STOMACH 1 HOUR BEFORE OR 2 HOURS AFTER A MEAL 120 tablet 0  . Ascorbic Acid (VITAMIN C) 1000 MG tablet Take 1,000 mg by mouth daily.    Marland Kitchen b complex vitamins tablet Take 1 tablet by mouth daily.    . Calcium Carbonate-Vitamin D (CALCIUM + D PO) Take 1 tablet by mouth daily.    . calcium-vitamin D (OSCAL WITH D) 500-200 MG-UNIT tablet Take 1 tablet by mouth 2 (two) times daily. 90 tablet 3  . leuprolide (LUPRON) 22.5 MG injection Inject 22.5 mg into the muscle every 3 (three) months.    . Melatonin 10 MG CAPS Take 12 mg 2 (two) times daily at 8 am and 10 pm by mouth.    . Multiple Vitamins-Minerals (MULTIVITAMIN PO) Take 1 tablet by mouth daily.    . Omega-3 Fatty Acids (FISH OIL) 1000 MG CAPS Take 1,000 mg by mouth daily.    . predniSONE (DELTASONE) 5 MG tablet Take 1 tablet (5 mg total) by mouth daily with breakfast. 90 tablet 3  . VITAMIN D, ERGOCALCIFEROL, PO Take 1 tablet by mouth daily.  No current facility-administered medications for this visit.     Allergies: No Known Allergies  Past Medical History, Surgical history, Social history, and Family History updated without any changes today.  Physical Exam:  Blood pressure 127/82, pulse 83, temperature (!) 97.4 F (36.3 C), resp. rate 18, height 5\' 8"  (1.727 m), weight 163 lb 3.2 oz (74 kg), SpO2 97 %.      ECOG: 0    General appearance: Comfortable appearing without any discomfort Head: Normocephalic without any trauma Oropharynx: Mucous membranes are moist and pink without any thrush or ulcers. Eyes: Pupils are equal and  round reactive to light. Lymph nodes: No cervical, supraclavicular, inguinal or axillary lymphadenopathy.   Heart:regular rate and rhythm.  S1 and S2 without leg edema. Lung: Clear without any rhonchi or wheezes.  No dullness to percussion. Abdomin: Soft, nontender, nondistended with good bowel sounds.  No hepatosplenomegaly. Musculoskeletal: No joint deformity or effusion.  Full range of motion noted. Neurological: No deficits noted on motor, sensory and deep tendon reflex exam. Skin: No petechial rash or dryness.  Appeared moist.       Lab Results: Lab Results  Component Value Date   WBC 5.4 12/14/2018   HGB 14.9 12/14/2018   HCT 44.2 12/14/2018   MCV 87.9 12/14/2018   PLT 219 12/14/2018     Chemistry      Component Value Date/Time   NA 141 12/14/2018 0754   NA 142 08/18/2017 0754   K 4.0 12/14/2018 0754   K 4.2 08/18/2017 0754   CL 106 12/14/2018 0754   CL 106 01/03/2013 0916   CO2 26 12/14/2018 0754   CO2 25 08/18/2017 0754   BUN 18 12/14/2018 0754   BUN 14.0 08/18/2017 0754   CREATININE 0.93 12/14/2018 0754   CREATININE 1.0 08/18/2017 0754      Component Value Date/Time   CALCIUM 9.3 12/14/2018 0754   CALCIUM 9.5 08/18/2017 0754   ALKPHOS 106 12/14/2018 0754   ALKPHOS 106 08/18/2017 0754   AST 23 12/14/2018 0754   AST 31 08/18/2017 0754   ALT 23 12/14/2018 0754   ALT 39 08/18/2017 0754   BILITOT 1.6 (H) 12/14/2018 0754   BILITOT 1.26 (H) 08/18/2017 0754      Results for ARSEN, MANGIONE (MRN [id number]) as of 12/16/2018 12:25  Ref. Range 09/14/2018 07:53 12/14/2018 07:54  Prostate Specific Ag, Serum Latest Ref Range: 0.0 - 4.0 ng/mL 0.4 0.2         ASSESSMENT AND PLAN:   58 year old man with:  1.  Castration-resistant prostate cancer with disease to the bone.   He is currently on Zytiga without any major complications with excellent PSA response.  Risks and benefits of continuing this treatment was discussed today and he is agreeable to continue.   Long-term issues including hypertension and adrenal sufficiency were discussed.    2.  Right hip metastatic lesion: Status post radiation therapy without any residual pain.   3. Androgen depravation: He is currently on Lupron and continues to tolerate it without any issues.  Long-term complications including osteoporosis, hot flashes among others were reviewed.  He is agreeable to proceed.  4. Bone health: I continue to recommend calcium and vitamin D supplements.  Bone density scan will be done in June of this year.  Delton See will be a consideration in the future if needed.  5.  COVID-19 considerations: He had a lot of questions regarding his risk and risk mitigation given his history of prostate cancer and ongoing anticancer  treatment.  I addressed all his concerns today although he is not on chemotherapy he still has increased risk of the general population given his prostate cancer and androgen deprivation therapy.  6. Follow-up: In 3 months for repeat evaluation and next Lupron injection.  25 minutes was spent with the patient face-to-face today.  More than 50% of time was spent on reviewing his disease status, complications related to therapy as well as answering questions regarding future plan of care as well as risk of infectious disease.   Zola Button, MD 4/24/202011:58 AM

## 2018-12-16 NOTE — Telephone Encounter (Signed)
Scheduled appt per 4/24 sch message/ los - pt is aware of appt date and time

## 2019-01-06 ENCOUNTER — Other Ambulatory Visit: Payer: Self-pay | Admitting: Oncology

## 2019-01-06 DIAGNOSIS — C61 Malignant neoplasm of prostate: Secondary | ICD-10-CM

## 2019-01-10 MED FILL — predniSONE 5 MG TABS: 5 | 30 days supply | Qty: 30 | Fill #9

## 2019-01-10 MED FILL — ABIRATERONE ACETATE 250 MG: 250 | 30 days supply | Qty: 120 | Fill #0

## 2019-02-06 ENCOUNTER — Other Ambulatory Visit: Payer: Self-pay | Admitting: Oncology

## 2019-02-06 DIAGNOSIS — C61 Malignant neoplasm of prostate: Secondary | ICD-10-CM

## 2019-02-06 MED FILL — OYSTER SHELL CALCIUM-VIT D: 500-200 | 30 days supply | Qty: 60 | Fill #2

## 2019-02-08 MED FILL — ABIRATERONE ACETATE 250 MG: 250 | 30 days supply | Qty: 120 | Fill #0

## 2019-02-08 MED FILL — predniSONE 5 MG TABS: 5 | 30 days supply | Qty: 30 | Fill #10

## 2019-02-17 ENCOUNTER — Ambulatory Visit
Admission: RE | Admit: 2019-02-17 | Discharge: 2019-02-17 | Disposition: A | Discharge status: Home / Self Care | Payer: BC Managed Care – PPO | Source: Ambulatory Visit | Attending: Oncology | Admitting: Oncology

## 2019-02-17 ENCOUNTER — Other Ambulatory Visit: Payer: Self-pay

## 2019-02-17 DIAGNOSIS — C61 Malignant neoplasm of prostate: Secondary | ICD-10-CM

## 2019-02-27 ENCOUNTER — Telehealth: Payer: Self-pay

## 2019-02-27 NOTE — Telephone Encounter (Signed)
Oral Oncology Patient Advocate Encounter  Prior Authorization for David Huffman has been approved.    PA# [id number] Effective dates: 02/27/19 through 02/27/20  Oral Oncology Clinic will continue to follow.   [place] [facility] [name] Phone [phone] Fax [phone] 02/27/2019    1:24 PM

## 2019-02-27 NOTE — Telephone Encounter (Signed)
Oral Oncology Patient Advocate Encounter  Received notification from [facility] [employer] that the existing prior authorization for David Huffman is due for renewal.  Renewal PA submitted by phone [phone] PA# [id number] Status is pending, should receive determination in 24-48 hours by fax.  Oral Oncology Clinic will continue to follow.  [name] [facility] [place] Phone [phone] Fax [phone] 02/27/2019    11:41 AM

## 2019-03-07 ENCOUNTER — Other Ambulatory Visit: Payer: Self-pay | Admitting: Oncology

## 2019-03-07 DIAGNOSIS — C61 Malignant neoplasm of prostate: Secondary | ICD-10-CM

## 2019-03-09 MED FILL — ABIRATERONE ACETATE 250 MG: 250 | 30 days supply | Qty: 120 | Fill #0

## 2019-03-09 MED FILL — predniSONE 5 MG TABS: 5 | 30 days supply | Qty: 30 | Fill #11

## 2019-03-15 ENCOUNTER — Inpatient Hospital Stay: Payer: BC Managed Care – PPO | Attending: Oncology

## 2019-03-15 ENCOUNTER — Other Ambulatory Visit: Payer: Self-pay

## 2019-03-15 DIAGNOSIS — Z5111 Encounter for antineoplastic chemotherapy: Secondary | ICD-10-CM | POA: Insufficient documentation

## 2019-03-15 DIAGNOSIS — E291 Testicular hypofunction: Secondary | ICD-10-CM | POA: Insufficient documentation

## 2019-03-15 DIAGNOSIS — C61 Malignant neoplasm of prostate: Secondary | ICD-10-CM | POA: Diagnosis present

## 2019-03-15 DIAGNOSIS — Z923 Personal history of irradiation: Secondary | ICD-10-CM | POA: Diagnosis not present

## 2019-03-15 DIAGNOSIS — C7951 Secondary malignant neoplasm of bone: Secondary | ICD-10-CM | POA: Insufficient documentation

## 2019-03-15 LAB — CBC WITH DIFFERENTIAL (CANCER CENTER ONLY)
Abs Immature Granulocytes: 0.01 10*3/uL (ref 0.00–0.07)
Basophils Absolute: 0 10*3/uL (ref 0.0–0.1)
Basophils Relative: 1 %
Eosinophils Absolute: 0.1 10*3/uL (ref 0.0–0.5)
Eosinophils Relative: 3 %
HCT: 43 % (ref 39.0–52.0)
Hemoglobin: 14.5 g/dL (ref 13.0–17.0)
Immature Granulocytes: 0 %
Lymphocytes Relative: 39 %
Lymphs Abs: 2 10*3/uL (ref 0.7–4.0)
MCH: 30 pg (ref 26.0–34.0)
MCHC: 33.7 g/dL (ref 30.0–36.0)
MCV: 88.8 fL (ref 80.0–100.0)
Monocytes Absolute: 0.5 10*3/uL (ref 0.1–1.0)
Monocytes Relative: 10 %
Neutro Abs: 2.4 10*3/uL (ref 1.7–7.7)
Neutrophils Relative %: 47 %
Platelet Count: 229 10*3/uL (ref 150–400)
RBC: 4.84 MIL/uL (ref 4.22–5.81)
RDW: 12.4 % (ref 11.5–15.5)
WBC Count: 5 10*3/uL (ref 4.0–10.5)
nRBC: 0 % (ref 0.0–0.2)

## 2019-03-15 LAB — CMP (CANCER CENTER ONLY)
ALT: 19 U/L (ref 0–44)
AST: 22 U/L (ref 15–41)
Albumin: 4 g/dL (ref 3.5–5.0)
Alkaline Phosphatase: 90 U/L (ref 38–126)
Anion gap: 10 (ref 5–15)
BUN: 21 mg/dL — ABNORMAL HIGH (ref 6–20)
CO2: 25 mmol/L (ref 22–32)
Calcium: 9.6 mg/dL (ref 8.9–10.3)
Chloride: 107 mmol/L (ref 98–111)
Creatinine: 0.99 mg/dL (ref 0.61–1.24)
GFR, Est AFR Am: >60 mL/min (ref >60)
GFR, Estimated: >60 mL/min (ref >60)
Glucose, Bld: 86 mg/dL (ref 70–99)
Potassium: 4.4 mmol/L (ref 3.5–5.1)
Sodium: 142 mmol/L (ref 135–145)
Total Bilirubin: 1.4 mg/dL — ABNORMAL HIGH (ref 0.3–1.2)
Total Protein: 7 g/dL (ref 6.5–8.1)

## 2019-03-16 LAB — PROSTATE-SPECIFIC AG, SERUM (LABCORP): Prostate Specific Ag, Serum: 0.1 ng/mL (ref 0.0–4.0)

## 2019-03-17 ENCOUNTER — Inpatient Hospital Stay: Payer: BC Managed Care – PPO

## 2019-03-17 ENCOUNTER — Inpatient Hospital Stay (HOSPITAL_BASED_OUTPATIENT_CLINIC_OR_DEPARTMENT_OTHER): Payer: BC Managed Care – PPO | Admitting: Oncology

## 2019-03-17 ENCOUNTER — Other Ambulatory Visit: Payer: Self-pay

## 2019-03-17 VITALS — BP 113/84 | HR 60 | Temp 98.7°F | Resp 17 | Ht 68.0 in | Wt 164.5 lb

## 2019-03-17 DIAGNOSIS — E291 Testicular hypofunction: Secondary | ICD-10-CM | POA: Diagnosis not present

## 2019-03-17 DIAGNOSIS — C61 Malignant neoplasm of prostate: Secondary | ICD-10-CM | POA: Diagnosis not present

## 2019-03-17 DIAGNOSIS — C7951 Secondary malignant neoplasm of bone: Secondary | ICD-10-CM | POA: Diagnosis not present

## 2019-03-17 MED ORDER — LEUPROLIDE ACETATE (3 MONTH) 22.5 MG IM KIT
PACK | INTRAMUSCULAR | Status: AC
  Filled 2019-03-17: qty 22.5

## 2019-03-17 MED ORDER — LEUPROLIDE ACETATE (3 MONTH) 22.5 MG IM KIT
22.5000 mg | PACK | Freq: Once | INTRAMUSCULAR | Status: AC
  Administered 2019-03-17: 22.5 mg via INTRAMUSCULAR

## 2019-03-17 NOTE — Patient Instructions (Signed)
Leuprolide injection What is this medicine? LEUPROLIDE (loo PROE lide) is a man-made hormone. It is used to treat the symptoms of prostate cancer. This medicine may also be used to treat children with early onset of puberty. It may be used for other hormonal conditions. This medicine may be used for other purposes; ask your health care provider or pharmacist if you have questions. COMMON BRAND NAME(S): Lupron What should I tell my health care provider before I take this medicine? They need to know if you have any of these conditions:  diabetes  heart disease or previous heart attack  high blood pressure  high cholesterol  pain or difficulty passing urine  spinal cord metastasis  stroke  tobacco smoker  an unusual or allergic reaction to leuprolide, benzyl alcohol, other medicines, foods, dyes, or preservatives  pregnant or trying to get pregnant  breast-feeding How should I use this medicine? This medicine is for injection under the skin or into a muscle. You will be taught how to prepare and give this medicine. Use exactly as directed. Take your medicine at regular intervals. Do not take your medicine more often than directed. It is important that you put your used needles and syringes in a special sharps container. Do not put them in a trash can. If you do not have a sharps container, call your pharmacist or healthcare provider to get one. A special MedGuide will be given to you by the pharmacist with each prescription and refill. Be sure to read this information carefully each time. Talk to your pediatrician regarding the use of this medicine in children. While this medicine may be prescribed for children as young as 8 years for selected conditions, precautions do apply. Overdosage: If you think you have taken too much of this medicine contact a poison control center or emergency room at once. NOTE: This medicine is only for you. Do not share this medicine with others. What if  I miss a dose? If you miss a dose, take it as soon as you can. If it is almost time for your next dose, take only that dose. Do not take double or extra doses. What may interact with this medicine? Do not take this medicine with any of the following medications:  chasteberry This medicine may also interact with the following medications:  herbal or dietary supplements, like black cohosh or DHEA  male hormones, like estrogens or progestins and birth control pills, patches, rings, or injections  male hormones, like testosterone This list may not describe all possible interactions. Give your health care provider a list of all the medicines, herbs, non-prescription drugs, or dietary supplements you use. Also tell them if you smoke, drink alcohol, or use illegal drugs. Some items may interact with your medicine. What should I watch for while using this medicine? Visit your doctor or health care professional for regular checks on your progress. During the first week, your symptoms may get worse, but then will improve as you continue your treatment. You may get hot flashes, increased bone pain, increased difficulty passing urine, or an aggravation of nerve symptoms. Discuss these effects with your doctor or health care professional, some of them may improve with continued use of this medicine. Male patients may experience a menstrual cycle or spotting during the first 2 months of therapy with this medicine. If this continues, contact your doctor or health care professional. This medicine may increase blood sugar. Ask your healthcare provider if changes in diet or medicines are needed if   you have diabetes. What side effects may I notice from receiving this medicine? Side effects that you should report to your doctor or health care professional as soon as possible:  allergic reactions like skin rash, itching or hives, swelling of the face, lips, or tongue  breathing problems  chest  pain  depression or memory disorders  pain in your legs or groin  pain at site where injected  severe headache  signs and symptoms of high blood sugar such as being more thirsty or hungry or having to urinate more than normal. You may also feel very tired or have blurry vision  swelling of the feet and legs  visual changes  vomiting Side effects that usually do not require medical attention (report to your doctor or health care professional if they continue or are bothersome):  breast swelling or tenderness  decrease in sex drive or performance  diarrhea  hot flashes  loss of appetite  muscle, joint, or bone pains  nausea  redness or irritation at site where injected  skin problems or acne This list may not describe all possible side effects. Call your doctor for medical advice about side effects. You may report side effects to FDA at 1-800-FDA-1088. Where should I keep my medicine? Keep out of the reach of children. Store below 25 degrees C (77 degrees F). Do not freeze. Protect from light. Do not use if it is not clear or if there are particles present. Throw away any unused medicine after the expiration date. NOTE: This sheet is a summary. It may not cover all possible information. If you have questions about this medicine, talk to your doctor, pharmacist, or health care provider.  2020 Elsevier/Gold Standard (2018-06-09 09:52:48)  

## 2019-03-17 NOTE — Progress Notes (Signed)
Hematology and Oncology Follow Up Visit  David Huffman [id number] 1961-05-10 58 y.o. 03/17/2019 12:16 PM  CC: [name], MD    Principle Diagnosis: 58 year old man with advanced prostate cancer documented in 2018.  He was found to have castration-resistant with pelvic involvement.  He was diagnosed 2008 with Gleason score of 7 and a PSA of 31 and localized prostate cancer.  Prior Therapy: 1. Status post prostatectomy done in 2008, pathology revealing T2c N0 with 0 out of 12 lymph nodes involved.  2. The patient continued to have rise in his PSA up to 52 after prostatectomy without any measurable disease.  Lupron was started at that time. 3.     Casodex 50 mg daily added in 05/2011. Therapy discontinued in March 2018 because of progression of disease. 4.     Status post radiation therapy to the pelvic bone completed in June 2019 he received 50 Gy in 10 fractions.   Current therapy:   Lupron 22.5 mg every 3 months started in 2018.  Zytiga 1000 mg daily with prednisone 5 mg daily since May 2018.   Interim History:  David Huffman is here for a follow-up.  Since the last visit, he reports no major changes in his health.  He continues to be active and attends activities of daily living including working full-time.  He denies any recent hospitalization or illnesses.  He denies any bone pain or pathological fractures.  He continues to tolerate Zytiga without any recent issues.  He denies any edema, fatigue or dyspepsia.   Patient denied headaches, blurry vision, syncope or seizures.  Denies any fevers, chills or sweats.  Denied chest pain, palpitation, orthopnea or leg edema.  Denied cough, wheezing or hemoptysis.  Denied nausea, vomiting or abdominal pain.  Denies any constipation or diarrhea.  Denies any frequency urgency or hesitancy.  Denies any arthralgias or myalgias.  Denies any skin rashes or lesions.  Denies any bleeding or clotting tendency.  Denies any easy bruising.  Denies any hair or nail  changes.  Denies any anxiety or depression.  Remaining review of system is negative.      Medications: Reviewed without any changes. Current Outpatient Medications  Medication Sig Dispense Refill  . abiraterone acetate (ZYTIGA) 250 MG tablet TAKE 4 TABLETS (1000MG ) BY MOUTH DAILY ON AN EMPTY STOMACH 1 HOUR BEFORE OR 2 HOURS AFTER A MEAL 120 tablet 0  . Ascorbic Acid (VITAMIN C) 1000 MG tablet Take 1,000 mg by mouth daily.    Marland Kitchen b complex vitamins tablet Take 1 tablet by mouth daily.    . Calcium Carbonate-Vitamin D (CALCIUM + D PO) Take 1 tablet by mouth daily.    . calcium-vitamin D (OSCAL WITH D) 500-200 MG-UNIT tablet Take 1 tablet by mouth 2 (two) times daily. 90 tablet 3  . leuprolide (LUPRON) 22.5 MG injection Inject 22.5 mg into the muscle every 3 (three) months.    . Melatonin 10 MG CAPS Take 12 mg 2 (two) times daily at 8 am and 10 pm by mouth.    . Multiple Vitamins-Minerals (MULTIVITAMIN PO) Take 1 tablet by mouth daily.    . Omega-3 Fatty Acids (FISH OIL) 1000 MG CAPS Take 1,000 mg by mouth daily.    . predniSONE (DELTASONE) 5 MG tablet Take 1 tablet (5 mg total) by mouth daily with breakfast. 90 tablet 3  . VITAMIN D, ERGOCALCIFEROL, PO Take 1 tablet by mouth daily.     No current facility-administered medications for this visit.  Facility-Administered Medications Ordered in Other Visits  Medication Dose Route Frequency Provider Last Rate Last Dose  . leuprolide (LUPRON) injection 22.5 mg  22.5 mg Intramuscular Once Shadad, Mathis Dad, MD        Allergies: No Known Allergies  Past Medical History, Surgical history, Social history, and Family History no changes noted on review.  Physical Exam:  Blood pressure 113/84, pulse 60, temperature 98.7 F (37.1 C), temperature source Oral, resp. rate 17, height 5\' 8"  (1.727 m), weight 164 lb 8 oz (74.6 kg), SpO2 100 %.      ECOG: 0     General appearance: Alert, awake without any distress. Head: Atraumatic without  abnormalities Oropharynx: Without any thrush or ulcers. Eyes: No scleral icterus. Lymph nodes: No lymphadenopathy noted in the cervical, supraclavicular, or axillary nodes Heart:regular rate and rhythm, without any murmurs or gallops.   Lung: Clear to auscultation without any rhonchi, wheezes or dullness to percussion. Abdomin: Soft, nontender without any shifting dullness or ascites. Musculoskeletal: No clubbing or cyanosis. Neurological: No motor or sensory deficits. Skin: No rashes or lesions. Psychiatric: Mood and affect appeared normal.       Lab Results: Lab Results  Component Value Date   WBC 5.0 03/15/2019   HGB 14.5 03/15/2019   HCT 43.0 03/15/2019   MCV 88.8 03/15/2019   PLT 229 03/15/2019     Chemistry      Component Value Date/Time   NA 142 03/15/2019 0802   NA 142 08/18/2017 0754   K 4.4 03/15/2019 0802   K 4.2 08/18/2017 0754   CL 107 03/15/2019 0802   CL 106 01/03/2013 0916   CO2 25 03/15/2019 0802   CO2 25 08/18/2017 0754   BUN 21 (H) 03/15/2019 0802   BUN 14.0 08/18/2017 0754   CREATININE 0.99 03/15/2019 0802   CREATININE 1.0 08/18/2017 0754      Component Value Date/Time   CALCIUM 9.6 03/15/2019 0802   CALCIUM 9.5 08/18/2017 0754   ALKPHOS 90 03/15/2019 0802   ALKPHOS 106 08/18/2017 0754   AST 22 03/15/2019 0802   AST 31 08/18/2017 0754   ALT 19 03/15/2019 0802   ALT 39 08/18/2017 0754   BILITOT 1.4 (H) 03/15/2019 0802   BILITOT 1.26 (H) 08/18/2017 0754        Results for David Huffman, David Huffman (MRN [id number]) as of 03/17/2019 12:18  Ref. Range 12/14/2018 07:54 03/15/2019 08:02  Prostate Specific Ag, Serum Latest Ref Range: 0.0 - 4.0 ng/mL 0.2 0.1        ASSESSMENT AND PLAN:   58 year old man with:  1.  Advanced prostate cancer that is currently castration-resistant with that documented disease to the pelvic bones.   He remains on Zytiga and completed radiation therapy with excellent response.  His PSA continues to decline currently at  0.1.  Risks and benefits of continuing Zytiga long-term was reviewed.  Potential complications were also reiterated including hypertension and adrenal insufficiency.  He is agreeable to continue at this time.    2.  Right hip metastatic lesion: He is status post radiation therapy without any recent exacerbation or pain.  3. Androgen depravation: Risks and benefits of long-term Lupron was addressed.  Osteoporosis, hot flashes and sexual dysfunction were reiterated.  He is agreeable to continue and will receive Lupron today and repeated in 3 months.  4. Bone health: His bone density obtained on 02/17/2019 was reviewed and confirms osteoporosis.  He has also documented disease to the bone which put him at risk of  skeletal related events and fractures.  Risks and benefits of starting Delton See was reviewed today to combat the osteoporosis as well as skeletal related events was reviewed.  Complications including osteonecrosis of the jaw and hypocalcemia were reiterated.  He is already on calcium supplements and will obtain dental clearance before the next visit.  I anticipate starting Xgeva in October 2020.  5.  COVID-19 considerations: I addressed questions and concerns regarding his risk of infection as well as his families risk.  He also had questions regarding antibiotic testing which I addressed the best of my knowledge.  6. Follow-up: In 3 months for repeat evaluation and Lupron and Xgeva injections  25 minutes was spent with the patient face-to-face today.  More than 50% of time was dedicated to reviewing his disease status, treatment options, complications related therapy and future plan of care.   Zola Button, MD 7/24/202012:16 PM

## 2019-03-20 ENCOUNTER — Telehealth: Payer: Self-pay | Admitting: Oncology

## 2019-03-20 NOTE — Telephone Encounter (Signed)
Called and left msg. Mailed printou

## 2019-03-28 ENCOUNTER — Telehealth: Payer: Self-pay | Admitting: Oncology

## 2019-03-28 NOTE — Telephone Encounter (Signed)
R/s appt per 8/4 sch message - unable to reach pt . Left message for patient with appt date and time

## 2019-04-05 ENCOUNTER — Other Ambulatory Visit: Payer: Self-pay | Admitting: Oncology

## 2019-04-05 DIAGNOSIS — C61 Malignant neoplasm of prostate: Secondary | ICD-10-CM

## 2019-04-06 ENCOUNTER — Other Ambulatory Visit: Payer: Self-pay | Admitting: Oncology

## 2019-04-10 ENCOUNTER — Other Ambulatory Visit: Payer: Self-pay | Admitting: Oncology

## 2019-04-10 MED FILL — predniSONE 5 MG TABS: 5 | 30 days supply | Qty: 90 | Fill #0

## 2019-04-10 MED FILL — ABIRATERONE ACETATE 250 MG: 250 | 30 days supply | Qty: 120 | Fill #0

## 2019-04-11 MED FILL — OYSTER SHELL 500-VIT D3 200: 500-200 | 75 days supply | Qty: 150 | Fill #0

## 2019-05-08 ENCOUNTER — Other Ambulatory Visit: Payer: Self-pay | Admitting: Oncology

## 2019-05-08 DIAGNOSIS — C61 Malignant neoplasm of prostate: Secondary | ICD-10-CM

## 2019-05-11 MED FILL — ABIRATERONE ACETATE 250 MG: 250 | 30 days supply | Qty: 120 | Fill #0

## 2019-06-06 ENCOUNTER — Other Ambulatory Visit: Payer: Self-pay | Admitting: Oncology

## 2019-06-06 DIAGNOSIS — C61 Malignant neoplasm of prostate: Secondary | ICD-10-CM

## 2019-06-08 MED FILL — ABIRATERONE ACETATE 250 MG: 250 | 30 days supply | Qty: 120 | Fill #0

## 2019-06-16 ENCOUNTER — Other Ambulatory Visit: Payer: Self-pay

## 2019-06-16 ENCOUNTER — Inpatient Hospital Stay: Payer: BC Managed Care – PPO | Attending: Oncology

## 2019-06-16 DIAGNOSIS — C7951 Secondary malignant neoplasm of bone: Secondary | ICD-10-CM | POA: Diagnosis present

## 2019-06-16 DIAGNOSIS — C61 Malignant neoplasm of prostate: Secondary | ICD-10-CM | POA: Insufficient documentation

## 2019-06-16 DIAGNOSIS — Z5111 Encounter for antineoplastic chemotherapy: Secondary | ICD-10-CM | POA: Insufficient documentation

## 2019-06-16 LAB — CBC WITH DIFFERENTIAL (CANCER CENTER ONLY)
Abs Immature Granulocytes: 0.02 10*3/uL (ref 0.00–0.07)
Basophils Absolute: 0 10*3/uL (ref 0.0–0.1)
Basophils Relative: 0 %
Eosinophils Absolute: 0 10*3/uL (ref 0.0–0.5)
Eosinophils Relative: 0 %
HCT: 41 % (ref 39.0–52.0)
Hemoglobin: 14.3 g/dL (ref 13.0–17.0)
Immature Granulocytes: 0 %
Lymphocytes Relative: 12 %
Lymphs Abs: 1 10*3/uL (ref 0.7–4.0)
MCH: 30.3 pg (ref 26.0–34.0)
MCHC: 34.9 g/dL (ref 30.0–36.0)
MCV: 86.9 fL (ref 80.0–100.0)
Monocytes Absolute: 0.3 10*3/uL (ref 0.1–1.0)
Monocytes Relative: 3 %
Neutro Abs: 7.1 10*3/uL (ref 1.7–7.7)
Neutrophils Relative %: 85 %
Platelet Count: 236 10*3/uL (ref 150–400)
RBC: 4.72 MIL/uL (ref 4.22–5.81)
RDW: 12.1 % (ref 11.5–15.5)
WBC Count: 8.4 10*3/uL (ref 4.0–10.5)
nRBC: 0 % (ref 0.0–0.2)

## 2019-06-16 LAB — CMP (CANCER CENTER ONLY)
ALT: 26 U/L (ref 0–44)
AST: 28 U/L (ref 15–41)
Albumin: 4 g/dL (ref 3.5–5.0)
Alkaline Phosphatase: 95 U/L (ref 38–126)
Anion gap: 12 (ref 5–15)
BUN: 19 mg/dL (ref 6–20)
CO2: 24 mmol/L (ref 22–32)
Calcium: 9.7 mg/dL (ref 8.9–10.3)
Chloride: 106 mmol/L (ref 98–111)
Creatinine: 1 mg/dL (ref 0.61–1.24)
GFR, Est AFR Am: >60 mL/min (ref >60)
GFR, Estimated: >60 mL/min (ref >60)
Glucose, Bld: 114 mg/dL — ABNORMAL HIGH (ref 70–99)
Potassium: 4.3 mmol/L (ref 3.5–5.1)
Sodium: 142 mmol/L (ref 135–145)
Total Bilirubin: 1.6 mg/dL — ABNORMAL HIGH (ref 0.3–1.2)
Total Protein: 7 g/dL (ref 6.5–8.1)

## 2019-06-17 LAB — PROSTATE-SPECIFIC AG, SERUM (LABCORP): Prostate Specific Ag, Serum: 0.1 ng/mL (ref 0.0–4.0)

## 2019-06-20 ENCOUNTER — Other Ambulatory Visit: Payer: Self-pay

## 2019-06-20 ENCOUNTER — Inpatient Hospital Stay (HOSPITAL_BASED_OUTPATIENT_CLINIC_OR_DEPARTMENT_OTHER): Payer: BC Managed Care – PPO | Admitting: Oncology

## 2019-06-20 ENCOUNTER — Inpatient Hospital Stay: Payer: BC Managed Care – PPO

## 2019-06-20 ENCOUNTER — Telehealth: Payer: Self-pay | Admitting: Oncology

## 2019-06-20 ENCOUNTER — Other Ambulatory Visit: Payer: BC Managed Care – PPO

## 2019-06-20 VITALS — BP 134/80 | HR 66 | Temp 98.0°F | Resp 17 | Ht 68.0 in | Wt 165.0 lb

## 2019-06-20 DIAGNOSIS — C61 Malignant neoplasm of prostate: Secondary | ICD-10-CM

## 2019-06-20 DIAGNOSIS — Z5111 Encounter for antineoplastic chemotherapy: Secondary | ICD-10-CM | POA: Diagnosis not present

## 2019-06-20 MED ORDER — LEUPROLIDE ACETATE (3 MONTH) 22.5 MG ~~LOC~~ KIT
22.5000 mg | PACK | Freq: Once | SUBCUTANEOUS | Status: AC
  Administered 2019-06-20: 15:00:00 22.5 mg via SUBCUTANEOUS
  Filled 2019-06-20: qty 22.5

## 2019-06-20 MED ORDER — DENOSUMAB 120 MG/1.7ML ~~LOC~~ SOLN
SUBCUTANEOUS | Status: AC
  Filled 2019-06-20: qty 1.7

## 2019-06-20 MED ORDER — DENOSUMAB 120 MG/1.7ML ~~LOC~~ SOLN
120.0000 mg | Freq: Once | SUBCUTANEOUS | Status: AC
  Administered 2019-06-20: 120 mg via SUBCUTANEOUS

## 2019-06-20 NOTE — Telephone Encounter (Signed)
Scheduled appt per 10/27 los.  Spoke with pt and he is aware of his appt date and time,.

## 2019-06-20 NOTE — Progress Notes (Signed)
Hematology and Oncology Follow Up Visit  David Huffman [id number] [birth year] 58 y.o. 06/20/2019 1:37 PM  CC: David Messick, MD    Principle Diagnosis: 58 year old man with castration-resistant prostate cancer with disease to the bone noted in 2018 after initial presentation and 2008 with Gleason score of 7 and a PSA of 31.   Prior Therapy: 1. Status post prostatectomy done in 2008, pathology revealing T2c N0 with 0 out of 12 lymph nodes involved.  2. The patient continued to have rise in his PSA up to 52 after prostatectomy without any measurable disease.  Lupron was started at that time. 3.     Casodex 50 mg daily added in 05/2011. Therapy discontinued in March 2018 because of progression of disease. 4.     Status post radiation therapy to the pelvic bone completed in June 2019 he received 50 Gy in 10 fractions.   Current therapy:   Lupron 22.5 mg every 3 months started in 2018.  Zytiga 1000 mg daily with prednisone 5 mg daily since May 2018.  Xgeva 120 mg every 3 months to start on 06/20/2019.   Interim History:  [name] returns today for a repeat evaluation.  Since the last visit, he reports no major changes in his health.  He continues to feel reasonably well without any complaints.  He denied any complications related to Zytiga including nausea, fatigue or tiredness.  Continues to be active and performs activities of daily living.  He is exercise regularly without any issues.  Formal status and quality of life remain excellent.  He denies any hip pain or any bone pain.  He denies any pathological fractures.  He denied any alteration mental status, neuropathy, confusion or dizziness.  Denies any headaches or lethargy.  Denies any night sweats, weight loss or changes in appetite.  Denied orthopnea, dyspnea on exertion or chest discomfort.  Denies shortness of breath, difficulty breathing hemoptysis or cough.  Denies any abdominal distention, nausea, early satiety or dyspepsia.  Denies any  hematuria, frequency, dysuria or nocturia.  Denies any skin irritation, dryness or rash.  Denies any ecchymosis or petechiae.  Denies any lymphadenopathy or clotting.  Denies any heat or cold intolerance.  Denies any anxiety or depression.  Remaining review of system is negative.         Medications: Without any changes on review. Current Outpatient Medications  Medication Sig Dispense Refill  . abiraterone acetate (ZYTIGA) 250 MG tablet TAKE 4 TABLETS (1000MG ) BY MOUTH DAILY ON AN EMPTY STOMACH 1 HOUR BEFORE OR 2 HOURS AFTER A MEAL 120 tablet 0  . Ascorbic Acid (VITAMIN C) 1000 MG tablet Take 1,000 mg by mouth daily.    Marland Kitchen b complex vitamins tablet Take 1 tablet by mouth daily.    . Calcium Carbonate-Vitamin D (CALCIUM + D PO) Take 1 tablet by mouth daily.    . calcium-vitamin D (OSCAL WITH D) 500-200 MG-UNIT TABS tablet TAKE 1 TABLET BY MOUTH 2 (TWO) TIMES DAILY. 90 tablet 3  . leuprolide (LUPRON) 22.5 MG injection Inject 22.5 mg into the muscle every 3 (three) months.    . Melatonin 10 MG CAPS Take 12 mg 2 (two) times daily at 8 am and 10 pm by mouth.    . Multiple Vitamins-Minerals (MULTIVITAMIN PO) Take 1 tablet by mouth daily.    . Omega-3 Fatty Acids (FISH OIL) 1000 MG CAPS Take 1,000 mg by mouth daily.    . predniSONE (DELTASONE) 5 MG tablet TAKE 1 TABLET (5 MG TOTAL)  BY MOUTH DAILY WITH BREAKFAST. 90 tablet 3  . VITAMIN D, ERGOCALCIFEROL, PO Take 1 tablet by mouth daily.     No current facility-administered medications for this visit.     Allergies: No Known Allergies  Past Medical History, Surgical history, Social history, and Family History unchanged on review.  Physical Exam:    Blood pressure 134/80, pulse 66, temperature 98 F (36.7 C), temperature source Oral, resp. rate 17, height 5\' 8"  (1.727 m), weight 165 lb (74.8 kg), SpO2 100 %.     ECOG: 0    General appearance: Comfortable appearing without any discomfort Head: Normocephalic without any  trauma Oropharynx: Mucous membranes are moist and pink without any thrush or ulcers. Eyes: Pupils are equal and round reactive to light. Lymph nodes: No cervical, supraclavicular, inguinal or axillary lymphadenopathy.   Heart:regular rate and rhythm.  S1 and S2 without leg edema. Lung: Clear without any rhonchi or wheezes.  No dullness to percussion. Abdomin: Soft, nontender, nondistended with good bowel sounds.  No hepatosplenomegaly. Musculoskeletal: No joint deformity or effusion.  Full range of motion noted. Neurological: No deficits noted on motor, sensory and deep tendon reflex exam. Skin: No petechial rash or dryness.  Appeared moist.        Lab Results: Lab Results  Component Value Date   WBC 8.4 06/16/2019   HGB 14.3 06/16/2019   HCT 41.0 06/16/2019   MCV 86.9 06/16/2019   PLT 236 06/16/2019     Chemistry      Component Value Date/Time   NA 142 06/16/2019 1511   NA 142 08/18/2017 0754   K 4.3 06/16/2019 1511   K 4.2 08/18/2017 0754   CL 106 06/16/2019 1511   CL 106 01/03/2013 0916   CO2 24 06/16/2019 1511   CO2 25 08/18/2017 0754   BUN 19 06/16/2019 1511   BUN 14.0 08/18/2017 0754   CREATININE 1.00 06/16/2019 1511   CREATININE 1.0 08/18/2017 0754      Component Value Date/Time   CALCIUM 9.7 06/16/2019 1511   CALCIUM 9.5 08/18/2017 0754   ALKPHOS 95 06/16/2019 1511   ALKPHOS 106 08/18/2017 0754   AST 28 06/16/2019 1511   AST 31 08/18/2017 0754   ALT 26 06/16/2019 1511   ALT 39 08/18/2017 0754   BILITOT 1.6 (H) 06/16/2019 1511   BILITOT 1.26 (H) 08/18/2017 0754        Results for [name], [name] (MRN [id number]) as of 06/20/2019 13:38  Ref. Range 03/15/2019 08:02 06/16/2019 15:11  Prostate Specific Ag, Serum Latest Ref Range: 0.0 - 4.0 ng/mL 0.1 0.1         ASSESSMENT AND PLAN:   58 year old man with:  1.  Castration-resistant prostate cancer with documented pelvic disease in the bone.     He continues to tolerate Zytiga with excellent PSA  response at this time currently at 0.1.  Risks and benefits and alternative options were reviewed today.  Potential long-term complication occluding hypertension, adrenal insufficiency among others were reviewed.  He is agreeable to continue at this time.  The plan is to repeat imaging studies in January 2021.     2.  Right hip metastatic lesion: No worsening pain or decline in his performance status.  He status post radiation therapy.  3. Androgen depravation: Risks and benefits of continuing androgen deprivation was reviewed again.  These include osteoporosis, hot flashes and weight gain.  He will receive Eligard today and repeated in 3 months.  4. Bone health: He has documented osteoporosis  as well as metastatic disease to the bone.  We will start Xgeva today and repeated 3 months.  Long-term complications including osteonecrosis of the jaw and hypocalcemia were reiterated.  He obtained dental clearance and no objections from that standpoint.  He will continue calcium supplements at this time.  5. Follow-up: He will return in 3 months for repeat evaluation and injection of Eligard and Xgeva.  25 minutes was spent with the patient face-to-face today.  More than 50% of time was spent on updating his disease status, reviewing laboratory data and discussing future plan of care.   Zola Button, MD 10/27/20201:37 PM

## 2019-07-04 ENCOUNTER — Other Ambulatory Visit: Payer: Self-pay | Admitting: Oncology

## 2019-07-04 DIAGNOSIS — C61 Malignant neoplasm of prostate: Secondary | ICD-10-CM

## 2019-07-04 MED FILL — OYSTER SHELL 500-VIT D3 200: 500-200 | 75 days supply | Qty: 150 | Fill #1

## 2019-07-11 MED FILL — ABIRATERONE ACETATE 250 MG: 250 | 30 days supply | Qty: 120 | Fill #0

## 2019-07-11 MED FILL — predniSONE 5 MG TABS: 5 | 30 days supply | Qty: 90 | Fill #1

## 2019-08-07 ENCOUNTER — Other Ambulatory Visit: Payer: Self-pay | Admitting: Oncology

## 2019-08-07 DIAGNOSIS — C61 Malignant neoplasm of prostate: Secondary | ICD-10-CM

## 2019-08-11 MED FILL — ABIRATERONE ACETATE 250 MG: 250 | 30 days supply | Qty: 120 | Fill #0

## 2019-08-11 MED FILL — predniSONE 5 MG TABS: 5 | 30 days supply | Qty: 90 | Fill #2

## 2019-09-05 ENCOUNTER — Other Ambulatory Visit: Payer: Self-pay | Admitting: Oncology

## 2019-09-05 DIAGNOSIS — C61 Malignant neoplasm of prostate: Secondary | ICD-10-CM

## 2019-09-08 MED FILL — ABIRATERONE ACETATE 250 MG: 250 | 30 days supply | Qty: 120 | Fill #0

## 2019-09-18 ENCOUNTER — Other Ambulatory Visit: Payer: Self-pay | Admitting: Medical Oncology

## 2019-09-18 ENCOUNTER — Other Ambulatory Visit: Payer: Self-pay

## 2019-09-18 DIAGNOSIS — C61 Malignant neoplasm of prostate: Secondary | ICD-10-CM

## 2019-09-19 ENCOUNTER — Other Ambulatory Visit: Payer: BC Managed Care – PPO

## 2019-09-19 ENCOUNTER — Encounter (HOSPITAL_COMMUNITY): Payer: Self-pay

## 2019-09-19 ENCOUNTER — Other Ambulatory Visit: Payer: Self-pay

## 2019-09-19 ENCOUNTER — Inpatient Hospital Stay: Payer: BC Managed Care – PPO | Attending: Oncology

## 2019-09-19 ENCOUNTER — Encounter (HOSPITAL_COMMUNITY)
Admission: RE | Admit: 2019-09-19 | Discharge: 2019-09-19 | Disposition: A | Discharge status: Home / Self Care | Payer: BC Managed Care – PPO | Source: Ambulatory Visit | Attending: Oncology | Admitting: Oncology

## 2019-09-19 ENCOUNTER — Ambulatory Visit (HOSPITAL_COMMUNITY)
Admission: RE | Admit: 2019-09-19 | Discharge: 2019-09-19 | Disposition: A | Discharge status: Home / Self Care | Payer: BC Managed Care – PPO | Source: Ambulatory Visit | Attending: Oncology | Admitting: Oncology

## 2019-09-19 DIAGNOSIS — C61 Malignant neoplasm of prostate: Secondary | ICD-10-CM

## 2019-09-19 DIAGNOSIS — C7951 Secondary malignant neoplasm of bone: Secondary | ICD-10-CM | POA: Insufficient documentation

## 2019-09-19 DIAGNOSIS — Z5111 Encounter for antineoplastic chemotherapy: Secondary | ICD-10-CM | POA: Insufficient documentation

## 2019-09-19 LAB — CBC WITH DIFFERENTIAL (CANCER CENTER ONLY)
Abs Immature Granulocytes: 0.01 10*3/uL (ref 0.00–0.07)
Basophils Absolute: 0 10*3/uL (ref 0.0–0.1)
Basophils Relative: 1 %
Eosinophils Absolute: 0.1 10*3/uL (ref 0.0–0.5)
Eosinophils Relative: 3 %
HCT: 43 % (ref 39.0–52.0)
Hemoglobin: 14.8 g/dL (ref 13.0–17.0)
Immature Granulocytes: 0 %
Lymphocytes Relative: 39 %
Lymphs Abs: 1.8 10*3/uL (ref 0.7–4.0)
MCH: 30.1 pg (ref 26.0–34.0)
MCHC: 34.4 g/dL (ref 30.0–36.0)
MCV: 87.6 fL (ref 80.0–100.0)
Monocytes Absolute: 0.4 10*3/uL (ref 0.1–1.0)
Monocytes Relative: 9 %
Neutro Abs: 2.2 10*3/uL (ref 1.7–7.7)
Neutrophils Relative %: 48 %
Platelet Count: 239 10*3/uL (ref 150–400)
RBC: 4.91 MIL/uL (ref 4.22–5.81)
RDW: 12.1 % (ref 11.5–15.5)
WBC Count: 4.7 10*3/uL (ref 4.0–10.5)
nRBC: 0 % (ref 0.0–0.2)

## 2019-09-19 LAB — CMP (CANCER CENTER ONLY)
ALT: 19 U/L (ref 0–44)
AST: 20 U/L (ref 15–41)
Albumin: 4.1 g/dL (ref 3.5–5.0)
Alkaline Phosphatase: 66 U/L (ref 38–126)
Anion gap: 9 (ref 5–15)
BUN: 16 mg/dL (ref 6–20)
CO2: 25 mmol/L (ref 22–32)
Calcium: 8.7 mg/dL — ABNORMAL LOW (ref 8.9–10.3)
Chloride: 108 mmol/L (ref 98–111)
Creatinine: 0.84 mg/dL (ref 0.61–1.24)
GFR, Est AFR Am: >60 mL/min (ref >60)
GFR, Estimated: >60 mL/min (ref >60)
Glucose, Bld: 96 mg/dL (ref 70–99)
Potassium: 4.3 mmol/L (ref 3.5–5.1)
Sodium: 142 mmol/L (ref 135–145)
Total Bilirubin: 1.8 mg/dL — ABNORMAL HIGH (ref 0.3–1.2)
Total Protein: 6.9 g/dL (ref 6.5–8.1)

## 2019-09-19 MED ORDER — IOHEXOL 300 MG/ML  SOLN
100.0000 mL | Freq: Once | INTRAMUSCULAR | Status: AC | PRN
  Administered 2019-09-19: 100 mL via INTRAVENOUS

## 2019-09-19 MED ORDER — TECHNETIUM TC 99M MEDRONATE IV KIT
20.5000 | PACK | Freq: Once | INTRAVENOUS | Status: AC | PRN
  Administered 2019-09-19: 10:00:00 20.5 via INTRAVENOUS

## 2019-09-19 MED ORDER — SODIUM CHLORIDE (PF) 0.9 % IJ SOLN
INTRAMUSCULAR | Status: AC
  Filled 2019-09-19: qty 50

## 2019-09-20 LAB — PROSTATE-SPECIFIC AG, SERUM (LABCORP): Prostate Specific Ag, Serum: <0.1 ng/mL (ref 0.0–4.0)

## 2019-09-21 ENCOUNTER — Inpatient Hospital Stay (HOSPITAL_BASED_OUTPATIENT_CLINIC_OR_DEPARTMENT_OTHER): Payer: BC Managed Care – PPO | Admitting: Oncology

## 2019-09-21 ENCOUNTER — Other Ambulatory Visit: Payer: Self-pay

## 2019-09-21 ENCOUNTER — Ambulatory Visit: Payer: BC Managed Care – PPO | Admitting: Oncology

## 2019-09-21 ENCOUNTER — Inpatient Hospital Stay: Payer: BC Managed Care – PPO

## 2019-09-21 ENCOUNTER — Other Ambulatory Visit (HOSPITAL_COMMUNITY): Payer: Self-pay | Admitting: Diagnostic Radiology

## 2019-09-21 VITALS — BP 155/85 | HR 78 | Temp 97.3°F | Resp 18 | Wt 166.5 lb

## 2019-09-21 DIAGNOSIS — C61 Malignant neoplasm of prostate: Secondary | ICD-10-CM

## 2019-09-21 DIAGNOSIS — Z5111 Encounter for antineoplastic chemotherapy: Secondary | ICD-10-CM | POA: Diagnosis not present

## 2019-09-21 MED ORDER — LEUPROLIDE ACETATE (3 MONTH) 22.5 MG ~~LOC~~ KIT
22.5000 mg | PACK | Freq: Once | SUBCUTANEOUS | Status: AC
  Administered 2019-09-21: 22.5 mg via SUBCUTANEOUS
  Filled 2019-09-21: qty 22.5

## 2019-09-21 NOTE — Progress Notes (Signed)
Hematology and Oncology Follow Up Visit  David Huffman [id number] 1961/07/04 59 y.o. 09/21/2019 9:56 AM  CC: David Messick, MD    Principle Diagnosis: 59 year old man with advanced prostate cancer with disease to the bone diagnosed in 2018.  He has castration-resistant.  He presented with advanced disease in 2008, Gleason score of 7 and a PSA of 31.   Prior Therapy: 1. Status post prostatectomy done in 2008, pathology revealing T2c N0 with 0 out of 12 lymph nodes involved.  2. The patient continued to have rise in his PSA up to 52 after prostatectomy without any measurable disease.  Lupron was started at that time. 3.     Casodex 50 mg daily added in 05/2011. Therapy discontinued in March 2018 because of progression of disease. 4.     Status post radiation therapy to the pelvic bone completed in June 2019 he received 50 Gy in 10 fractions.   Current therapy:   Lupron 22.5 mg every 3 months started in 2018.  He received Eligard in October 2020.  Zytiga 1000 mg daily with prednisone 5 mg daily since May 2018.  Xgeva 120 mg every 3 months started on 06/20/2019.   Interim History:  David Huffman presents today for a follow-up evaluation.  Since the last visit, he reports no major changes in his health.  He did have a close contact with a friend who tested positive for COVID-19 and his test has been negative a week ago.  He denies any symptoms at this time fevers chills sweats.  Weight loss or appetite changes.  Denies any complications related to Zytiga.  Continues to be active and work out regularly.         Medications: Unchanged on review. Current Outpatient Medications  Medication Sig Dispense Refill  . abiraterone acetate (ZYTIGA) 250 MG tablet TAKE 4 TABLETS (1000MG ) BY MOUTH DAILY ON AN EMPTY STOMACH 1 HOUR BEFORE OR 2 HOURS AFTER A MEAL 120 tablet 0  . Ascorbic Acid (VITAMIN C) 1000 MG tablet Take 1,000 mg by mouth daily.    Marland Kitchen b complex vitamins tablet Take 1 tablet by mouth daily.     . Calcium Carbonate-Vitamin D (CALCIUM + D PO) Take 1 tablet by mouth daily.    . calcium-vitamin D (OSCAL WITH D) 500-200 MG-UNIT TABS tablet TAKE 1 TABLET BY MOUTH 2 (TWO) TIMES DAILY. 90 tablet 3  . leuprolide (LUPRON) 22.5 MG injection Inject 22.5 mg into the muscle every 3 (three) months.    . Melatonin 10 MG CAPS Take 12 mg 2 (two) times daily at 8 am and 10 pm by mouth.    . Multiple Vitamins-Minerals (MULTIVITAMIN PO) Take 1 tablet by mouth daily.    . Omega-3 Fatty Acids (FISH OIL) 1000 MG CAPS Take 1,000 mg by mouth daily.    . predniSONE (DELTASONE) 5 MG tablet TAKE 1 TABLET (5 MG TOTAL) BY MOUTH DAILY WITH BREAKFAST. 90 tablet 3  . VITAMIN D, ERGOCALCIFEROL, PO Take 1 tablet by mouth daily.     No current facility-administered medications for this visit.    Allergies: No Known Allergies    Physical Exam:    Blood pressure (!) 155/85, pulse 78, temperature (!) 97.3 F (36.3 C), temperature source Temporal, resp. rate 18, weight 166 lb 8 oz (75.5 kg), SpO2 100 %.      ECOG: 0     General appearance: Alert, awake without any distress. Head: Atraumatic without abnormalities Oropharynx: Without any thrush or ulcers. Eyes: No scleral  icterus. Lymph nodes: No lymphadenopathy noted in the cervical, supraclavicular, or axillary nodes Heart:regular rate and rhythm, without any murmurs or gallops.   Lung: Clear to auscultation without any rhonchi, wheezes or dullness to percussion. Abdomin: Soft, nontender without any shifting dullness or ascites. Musculoskeletal: No clubbing or cyanosis. Neurological: No motor or sensory deficits. Skin: No rashes or lesions.         Lab Results: Lab Results  Component Value Date   WBC 4.7 09/19/2019   HGB 14.8 09/19/2019   HCT 43.0 09/19/2019   MCV 87.6 09/19/2019   PLT 239 09/19/2019     Chemistry      Component Value Date/Time   NA 142 09/19/2019 0919   NA 142 08/18/2017 0754   K 4.3 09/19/2019 0919   K 4.2  08/18/2017 0754   CL 108 09/19/2019 0919   CL 106 01/03/2013 0916   CO2 25 09/19/2019 0919   CO2 25 08/18/2017 0754   BUN 16 09/19/2019 0919   BUN 14.0 08/18/2017 0754   CREATININE 0.84 09/19/2019 0919   CREATININE 1.0 08/18/2017 0754      Component Value Date/Time   CALCIUM 8.7 (L) 09/19/2019 0919   CALCIUM 9.5 08/18/2017 0754   ALKPHOS 66 09/19/2019 0919   ALKPHOS 106 08/18/2017 0754   AST 20 09/19/2019 0919   AST 31 08/18/2017 0754   ALT 19 09/19/2019 0919   ALT 39 08/18/2017 0754   BILITOT 1.8 (H) 09/19/2019 0919   BILITOT 1.26 (H) 08/18/2017 0754         Results for David Huffman, David Huffman (MRN [id number]) as of 09/21/2019 09:42  Ref. Range 06/16/2019 15:11 09/19/2019 09:19  Prostate Specific Ag, Serum Latest Ref Range: 0.0 - 4.0 ng/mL 0.1 <0.1    IMPRESSION: 1. Redemonstrated sclerotic metastatic lesions of the right pubic symphysis and right inferior pubic ramus. No CT evidence of new osseous metastatic disease 2. No evidence of mass or lymphadenopathy within the abdomen or pelvis. 3. Status post prostatectomy.  IMPRESSION: Mixed pattern of response of osseous metastases, several no longer identified, RIGHT inferior pubic ramus lesion demonstrating decreased uptake, stable RIGHT pubic body lesion, and a potential new posterior RIGHT eighth rib metastasis versus fracture.   ASSESSMENT AND PLAN:   59 year old man with:  1.  Advanced prostate cancer with disease to the bone that is currently castration-resistant since 2018.      He has completed therapy outlined above and remains on Zytiga.  His PSA continues to be low and currently undetectable.  Imaging studies including CT scan and bone scan obtained on September 19, 2019 were reviewed and discussed with the patient.  He has no evidence to suggest any immune disease relapse.  Risks and benefits of continuing Zytiga long-term was discussed.  Alternative options including systemic chemotherapy among others were  reviewed.  He is agreeable to continue at this time.    2.  Right hip metastatic lesion: He is status post radiation therapy without any evidence of recurrence or pain.  3. Androgen depravation: He will receive Eligard injection today and be repeated in 3 months.  Long-term complications including osteoporosis, hot flashes and sexual dysfunction were reviewed.  Is agreeable to continue.  4. Bone health: He has received Xgeva without any complications.  Risks and benefits of continuing this therapy.  Complication occluding hypocalcemia and osteonecrosis of the jaw were reviewed.  His calcium level is slightly down I recommended continue to replace calcium, hold Xgeva for the time being.    5.  Follow-up: In 3 months for a follow-up and repeat Xgeva and Eligard.  30 minutes was dedicated to this encounter.  The time was spent on reviewing laboratory data, imaging studies, treatment options and future plan of care.  Zola Button, MD 1/28/20219:56 AM

## 2019-09-22 ENCOUNTER — Telehealth: Payer: Self-pay | Admitting: Oncology

## 2019-09-22 NOTE — Telephone Encounter (Signed)
Scheduled appt per 1/28 los.  Sent a message to HIM pool to get a calendar mailed out. 

## 2019-10-09 ENCOUNTER — Other Ambulatory Visit: Payer: Self-pay | Admitting: Oncology

## 2019-10-09 DIAGNOSIS — C61 Malignant neoplasm of prostate: Secondary | ICD-10-CM

## 2019-10-09 MED FILL — OYSTER SHELL CALCIUM-VIT D: 500-200 | 30 days supply | Qty: 60 | Fill #2

## 2019-10-10 MED FILL — ABIRATERONE ACETATE 250 MG: 250 | 30 days supply | Qty: 120 | Fill #0

## 2019-11-06 ENCOUNTER — Other Ambulatory Visit: Payer: Self-pay | Admitting: Oncology

## 2019-11-06 DIAGNOSIS — C61 Malignant neoplasm of prostate: Secondary | ICD-10-CM

## 2019-11-08 MED FILL — ABIRATERONE ACETATE 250 MG: 250 | 30 days supply | Qty: 120 | Fill #0

## 2019-11-08 MED FILL — OYSTER SHELL CALCIUM-VIT D: 500-200 | 30 days supply | Qty: 60 | Fill #0

## 2019-12-01 ENCOUNTER — Other Ambulatory Visit: Payer: Self-pay | Admitting: Oncology

## 2019-12-01 DIAGNOSIS — C61 Malignant neoplasm of prostate: Secondary | ICD-10-CM

## 2019-12-05 MED FILL — ABIRATERONE ACETATE 250 MG: 250 | 30 days supply | Qty: 120 | Fill #0

## 2019-12-05 MED FILL — OYSTER SHELL CALCIUM-VIT D: 500-200 | 30 days supply | Qty: 60 | Fill #1

## 2019-12-19 ENCOUNTER — Inpatient Hospital Stay: Payer: BC Managed Care – PPO | Attending: Oncology

## 2019-12-19 ENCOUNTER — Other Ambulatory Visit: Payer: Self-pay

## 2019-12-19 DIAGNOSIS — C61 Malignant neoplasm of prostate: Secondary | ICD-10-CM | POA: Diagnosis present

## 2019-12-19 DIAGNOSIS — Z5111 Encounter for antineoplastic chemotherapy: Secondary | ICD-10-CM | POA: Insufficient documentation

## 2019-12-19 DIAGNOSIS — C7951 Secondary malignant neoplasm of bone: Secondary | ICD-10-CM | POA: Diagnosis not present

## 2019-12-19 LAB — CMP (CANCER CENTER ONLY)
ALT: 22 U/L (ref 0–44)
AST: 22 U/L (ref 15–41)
Albumin: 4 g/dL (ref 3.5–5.0)
Alkaline Phosphatase: 63 U/L (ref 38–126)
Anion gap: 7 (ref 5–15)
BUN: 20 mg/dL (ref 6–20)
CO2: 27 mmol/L (ref 22–32)
Calcium: 9.1 mg/dL (ref 8.9–10.3)
Chloride: 107 mmol/L (ref 98–111)
Creatinine: 0.97 mg/dL (ref 0.61–1.24)
GFR, Est AFR Am: >60 mL/min (ref >60)
GFR, Estimated: >60 mL/min (ref >60)
Glucose, Bld: 100 mg/dL — ABNORMAL HIGH (ref 70–99)
Potassium: 4.4 mmol/L (ref 3.5–5.1)
Sodium: 141 mmol/L (ref 135–145)
Total Bilirubin: 1.9 mg/dL — ABNORMAL HIGH (ref 0.3–1.2)
Total Protein: 7 g/dL (ref 6.5–8.1)

## 2019-12-19 LAB — CBC WITH DIFFERENTIAL (CANCER CENTER ONLY)
Abs Immature Granulocytes: 0.01 10*3/uL (ref 0.00–0.07)
Basophils Absolute: 0 10*3/uL (ref 0.0–0.1)
Basophils Relative: 1 %
Eosinophils Absolute: 0.1 10*3/uL (ref 0.0–0.5)
Eosinophils Relative: 2 %
HCT: 43.5 % (ref 39.0–52.0)
Hemoglobin: 14.9 g/dL (ref 13.0–17.0)
Immature Granulocytes: 0 %
Lymphocytes Relative: 34 %
Lymphs Abs: 2 10*3/uL (ref 0.7–4.0)
MCH: 30.3 pg (ref 26.0–34.0)
MCHC: 34.3 g/dL (ref 30.0–36.0)
MCV: 88.6 fL (ref 80.0–100.0)
Monocytes Absolute: 0.5 10*3/uL (ref 0.1–1.0)
Monocytes Relative: 9 %
Neutro Abs: 3.1 10*3/uL (ref 1.7–7.7)
Neutrophils Relative %: 54 %
Platelet Count: 236 10*3/uL (ref 150–400)
RBC: 4.91 MIL/uL (ref 4.22–5.81)
RDW: 12.4 % (ref 11.5–15.5)
WBC Count: 5.7 10*3/uL (ref 4.0–10.5)
nRBC: 0 % (ref 0.0–0.2)

## 2019-12-20 LAB — PROSTATE-SPECIFIC AG, SERUM (LABCORP): Prostate Specific Ag, Serum: <0.1 ng/mL (ref 0.0–4.0)

## 2019-12-21 ENCOUNTER — Inpatient Hospital Stay: Payer: BC Managed Care – PPO | Admitting: Oncology

## 2019-12-21 ENCOUNTER — Other Ambulatory Visit: Payer: Self-pay

## 2019-12-21 ENCOUNTER — Inpatient Hospital Stay: Payer: BC Managed Care – PPO

## 2019-12-21 ENCOUNTER — Telehealth: Payer: Self-pay | Admitting: Oncology

## 2019-12-21 VITALS — BP 120/81 | HR 71 | Temp 98.3°F | Resp 17 | Ht 68.0 in | Wt 165.2 lb

## 2019-12-21 DIAGNOSIS — C61 Malignant neoplasm of prostate: Secondary | ICD-10-CM | POA: Diagnosis not present

## 2019-12-21 DIAGNOSIS — Z5111 Encounter for antineoplastic chemotherapy: Secondary | ICD-10-CM | POA: Diagnosis not present

## 2019-12-21 MED ORDER — DENOSUMAB 120 MG/1.7ML ~~LOC~~ SOLN
SUBCUTANEOUS | Status: AC
  Filled 2019-12-21: qty 1.7

## 2019-12-21 MED ORDER — LEUPROLIDE ACETATE (3 MONTH) 22.5 MG ~~LOC~~ KIT
22.5000 mg | PACK | Freq: Once | SUBCUTANEOUS | Status: AC
  Administered 2019-12-21: 22.5 mg via SUBCUTANEOUS
  Filled 2019-12-21: qty 22.5

## 2019-12-21 MED ORDER — DENOSUMAB 120 MG/1.7ML ~~LOC~~ SOLN
120.0000 mg | Freq: Once | SUBCUTANEOUS | Status: AC
  Administered 2019-12-21: 120 mg via SUBCUTANEOUS

## 2019-12-21 NOTE — Telephone Encounter (Signed)
Scheduled appt per 4/29 los.  Spoke with pt and he is aware of the appt date and time,

## 2019-12-21 NOTE — Progress Notes (Signed)
Hematology and Oncology Follow Up Visit  David Huffman [id number] 1960/12/08 59 y.o. 12/21/2019 9:34 AM  CC: [name], MD    Principle Diagnosis: 59 year old man with castration-resistant prostate cancer with disease to the bone documented in 2018.  He was found to have Gleason score of 7 and a PSA of 31 and advanced disease in 2008.  Prior Therapy: 1. Status post prostatectomy done in 2008, pathology revealing T2c N0 with 0 out of 12 lymph nodes involved.  2. The patient continued to have rise in his PSA up to 52 after prostatectomy without any measurable disease.  Lupron was started at that time. 3.     Casodex 50 mg daily added in 05/2011. Therapy discontinued in March 2018 because of progression of disease. 4.     Status post radiation therapy to the pelvic bone completed in June 2019 he received 50 Gy in 10 fractions.   Current therapy:   Lupron 22.5 mg every 3 months started in 2018.  He received Eligard in October 2020.  Zytiga 1000 mg daily with prednisone 5 mg daily since May 2018.  Xgeva 120 mg every 3 months started on 06/20/2019.   Interim History:  David Huffman returns today for a follow-up visit.  Since the last visit, he reports no major changes in his health.  He continues to work full-time and exercise regularly.  He denies any chest pain or shortness of breath.  He denies any bone pain or pathological fractures.  He denies any recent hospitalizations or illnesses.  He continues to tolerate Zytiga without any complaints.  Denies any excessive fatigue or hematuria.         Medications: Reviewed without changes. Current Outpatient Medications  Medication Sig Dispense Refill  . abiraterone acetate (ZYTIGA) 250 MG tablet TAKE 4 TABLETS (1000MG ) BY MOUTH DAILY ON AN EMPTY STOMACH 1 HOUR BEFORE OR 2 HOURS AFTER A MEAL 120 tablet 0  . Ascorbic Acid (VITAMIN C) 1000 MG tablet Take 1,000 mg by mouth daily.    Marland Kitchen b complex vitamins tablet Take 1 tablet by mouth daily.    .  Calcium Carbonate-Vitamin D (CALCIUM + D PO) Take 1 tablet by mouth daily.    . calcium-vitamin D (OSCAL WITH D) 500-200 MG-UNIT TABS tablet TAKE 1 TABLET BY MOUTH TWO TIMES DAILY 60 tablet 3  . leuprolide (LUPRON) 22.5 MG injection Inject 22.5 mg into the muscle every 3 (three) months.    . Melatonin 10 MG CAPS Take 12 mg 2 (two) times daily at 8 am and 10 pm by mouth.    . Multiple Vitamins-Minerals (MULTIVITAMIN PO) Take 1 tablet by mouth daily.    . Omega-3 Fatty Acids (FISH OIL) 1000 MG CAPS Take 1,000 mg by mouth daily.    . predniSONE (DELTASONE) 5 MG tablet TAKE 1 TABLET (5 MG TOTAL) BY MOUTH DAILY WITH BREAKFAST. 90 tablet 3  . VITAMIN D, ERGOCALCIFEROL, PO Take 1 tablet by mouth daily.     No current facility-administered medications for this visit.    Allergies: No Known Allergies    Physical Exam:     Blood pressure 120/81, pulse 71, temperature 98.3 F (36.8 C), temperature source Temporal, resp. rate 17, height 5\' 8"  (1.727 m), weight 165 lb 3.2 oz (74.9 kg), SpO2 100 %.      ECOG: 0    General appearance: Comfortable appearing without any discomfort Head: Normocephalic without any trauma Oropharynx: Mucous membranes are moist and pink without any thrush or ulcers. Eyes:  Pupils are equal and round reactive to light. Lymph nodes: No cervical, supraclavicular, inguinal or axillary lymphadenopathy.   Heart:regular rate and rhythm.  S1 and S2 without leg edema. Lung: Clear without any rhonchi or wheezes.  No dullness to percussion. Abdomin: Soft, nontender, nondistended with good bowel sounds.  No hepatosplenomegaly. Musculoskeletal: No joint deformity or effusion.  Full range of motion noted. Neurological: No deficits noted on motor, sensory and deep tendon reflex exam. Skin: No petechial rash or dryness.  Appeared moist.           Lab Results: Lab Results  Component Value Date   WBC 5.7 12/19/2019   HGB 14.9 12/19/2019   HCT 43.5 12/19/2019   MCV  88.6 12/19/2019   PLT 236 12/19/2019     Chemistry      Component Value Date/Time   NA 141 12/19/2019 0821   NA 142 08/18/2017 0754   K 4.4 12/19/2019 0821   K 4.2 08/18/2017 0754   CL 107 12/19/2019 0821   CL 106 01/03/2013 0916   CO2 27 12/19/2019 0821   CO2 25 08/18/2017 0754   BUN 20 12/19/2019 0821   BUN 14.0 08/18/2017 0754   CREATININE 0.97 12/19/2019 0821   CREATININE 1.0 08/18/2017 0754      Component Value Date/Time   CALCIUM 9.1 12/19/2019 0821   CALCIUM 9.5 08/18/2017 0754   ALKPHOS 63 12/19/2019 0821   ALKPHOS 106 08/18/2017 0754   AST 22 12/19/2019 0821   AST 31 08/18/2017 0754   ALT 22 12/19/2019 0821   ALT 39 08/18/2017 0754   BILITOT 1.9 (H) 12/19/2019 0821   BILITOT 1.26 (H) 08/18/2017 0754        Results for David Huffman, David Huffman (MRN [id number]) as of 12/21/2019 09:30  Ref. Range 12/19/2019 08:21  Prostate Specific Ag, Serum Latest Ref Range: 0.0 - 4.0 ng/mL <0.1    ASSESSMENT AND PLAN:   59 year old man with:  1.  Castration-resistant prostate cancer with disease to the bone diagnosed in 2018.        He continues to be on Zytiga which she has tolerated very well complications.  His PSA continues to be undetectable without any evidence of further disease progression.  Risks and benefits of continuing this therapy and alternative treatment options in the future were reviewed.  These would include systemic chemotherapy among others.  He is agreeable to continue at this time.     2.  Right hip metastatic lesion: No increased pain or fractures noted at this time.  3. Androgen depravation: He is currently on Eligard which she has tolerated well.  He will receive Xgeva today and repeated in 3 months.  Long-term complication occluding osteoporosis and hot flashes on others were reviewed.  4. Bone health: I recommended proceeding with Xgeva at this time.  His calcium levels are adequate.  Long-term complication clinic hypocalcemia and osteonecrosis of the jaw  were reviewed.  He is agreeable to proceed.    5. Follow-up: He will return in 3 months for a follow-up evaluation.  30 minutes were spent on this visit.  The time was dedicated to reviewing laboratory data, disease status update and addressing complications related therapy.  Zola Button, MD 4/29/20219:34 AM

## 2019-12-21 NOTE — Patient Instructions (Signed)
Denosumab injection What is this medicine? DENOSUMAB (den oh sue mab) slows bone breakdown. Prolia is used to treat osteoporosis in women after menopause and in men, and in people who are taking corticosteroids for 6 months or more. Xgeva is used to treat a high calcium level due to cancer and to prevent bone fractures and other bone problems caused by multiple myeloma or cancer bone metastases. Xgeva is also used to treat giant cell tumor of the bone. This medicine may be used for other purposes; ask your health care provider or pharmacist if you have questions. COMMON BRAND NAME(S): Prolia, XGEVA What should I tell my health care provider before I take this medicine? They need to know if you have any of these conditions:  dental disease  having surgery or tooth extraction  infection  kidney disease  low levels of calcium or Vitamin D in the blood  malnutrition  on hemodialysis  skin conditions or sensitivity  thyroid or parathyroid disease  an unusual reaction to denosumab, other medicines, foods, dyes, or preservatives  pregnant or trying to get pregnant  breast-feeding How should I use this medicine? This medicine is for injection under the skin. It is given by a health care professional in a hospital or clinic setting. A special MedGuide will be given to you before each treatment. Be sure to read this information carefully each time. For Prolia, talk to your pediatrician regarding the use of this medicine in children. Special care may be needed. For Xgeva, talk to your pediatrician regarding the use of this medicine in children. While this drug may be prescribed for children as young as 13 years for selected conditions, precautions do apply. Overdosage: If you think you have taken too much of this medicine contact a poison control center or emergency room at once. NOTE: This medicine is only for you. Do not share this medicine with others. What if I miss a dose? It is  important not to miss your dose. Call your doctor or health care professional if you are unable to keep an appointment. What may interact with this medicine? Do not take this medicine with any of the following medications:  other medicines containing denosumab This medicine may also interact with the following medications:  medicines that lower your chance of fighting infection  steroid medicines like prednisone or cortisone This list may not describe all possible interactions. Give your health care provider a list of all the medicines, herbs, non-prescription drugs, or dietary supplements you use. Also tell them if you smoke, drink alcohol, or use illegal drugs. Some items may interact with your medicine. What should I watch for while using this medicine? Visit your doctor or health care professional for regular checks on your progress. Your doctor or health care professional may order blood tests and other tests to see how you are doing. Call your doctor or health care professional for advice if you get a fever, chills or sore throat, or other symptoms of a cold or flu. Do not treat yourself. This drug may decrease your body's ability to fight infection. Try to avoid being around people who are sick. You should make sure you get enough calcium and vitamin D while you are taking this medicine, unless your doctor tells you not to. Discuss the foods you eat and the vitamins you take with your health care professional. See your dentist regularly. Brush and floss your teeth as directed. Before you have any dental work done, tell your dentist you are   receiving this medicine. Do not become pregnant while taking this medicine or for 5 months after stopping it. Talk with your doctor or health care professional about your birth control options while taking this medicine. Women should inform their doctor if they wish to become pregnant or think they might be pregnant. There is a potential for serious side  effects to an unborn child. Talk to your health care professional or pharmacist for more information. What side effects may I notice from receiving this medicine? Side effects that you should report to your doctor or health care professional as soon as possible:  allergic reactions like skin rash, itching or hives, swelling of the face, lips, or tongue  bone pain  breathing problems  dizziness  jaw pain, especially after dental work  redness, blistering, peeling of the skin  signs and symptoms of infection like fever or chills; cough; sore throat; pain or trouble passing urine  signs of low calcium like fast heartbeat, muscle cramps or muscle pain; pain, tingling, numbness in the hands or feet; seizures  unusual bleeding or bruising  unusually weak or tired Side effects that usually do not require medical attention (report to your doctor or health care professional if they continue or are bothersome):  constipation  diarrhea  headache  joint pain  loss of appetite  muscle pain  runny nose  tiredness  upset stomach This list may not describe all possible side effects. Call your doctor for medical advice about side effects. You may report side effects to FDA at 1-800-FDA-1088. Where should I keep my medicine? This medicine is only given in a clinic, doctor's office, or other health care setting and will not be stored at home. NOTE: This sheet is a summary. It may not cover all possible information. If you have questions about this medicine, talk to your doctor, pharmacist, or health care provider.  2020 Elsevier/Gold Standard (2017-12-17 16:10:44) Leuprolide depot injection What is this medicine? LEUPROLIDE (loo PROE lide) is a man-made protein that acts like a natural hormone in the body. It decreases testosterone in men and decreases estrogen in women. In men, this medicine is used to treat advanced prostate cancer. In women, some forms of this medicine may be used  to treat endometriosis, uterine fibroids, or other male hormone-related problems. This medicine may be used for other purposes; ask your health care provider or pharmacist if you have questions. COMMON BRAND NAME(S): Eligard, Fensolv, Lupron Depot, Lupron Depot-Ped, Viadur What should I tell my health care provider before I take this medicine? They need to know if you have any of these conditions:  diabetes  heart disease or previous heart attack  high blood pressure  high cholesterol  mental illness  osteoporosis  pain or difficulty passing urine  seizures  spinal cord metastasis  stroke  suicidal thoughts, plans, or attempt; a previous suicide attempt by you or a family member  tobacco smoker  unusual vaginal bleeding (women)  an unusual or allergic reaction to leuprolide, benzyl alcohol, other medicines, foods, dyes, or preservatives  pregnant or trying to get pregnant  breast-feeding How should I use this medicine? This medicine is for injection into a muscle or for injection under the skin. It is given by a health care professional in a hospital or clinic setting. The specific product will determine how it will be given to you. Make sure you understand which product you receive and how often you will receive it. Talk to your pediatrician regarding the use of   this medicine in children. Special care may be needed. Overdosage: If you think you have taken too much of this medicine contact a poison control center or emergency room at once. NOTE: This medicine is only for you. Do not share this medicine with others. What if I miss a dose? It is important not to miss a dose. Call your doctor or health care professional if you are unable to keep an appointment. Depot injections: Depot injections are given either once-monthly, every 12 weeks, every 16 weeks, or every 24 weeks depending on the product you are prescribed. The product you are prescribed will be based on if you  are male or male, and your condition. Make sure you understand your product and dosing. What may interact with this medicine? Do not take this medicine with any of the following medications:  chasteberry This medicine may also interact with the following medications:  herbal or dietary supplements, like black cohosh or DHEA  male hormones, like estrogens or progestins and birth control pills, patches, rings, or injections  male hormones, like testosterone This list may not describe all possible interactions. Give your health care provider a list of all the medicines, herbs, non-prescription drugs, or dietary supplements you use. Also tell them if you smoke, drink alcohol, or use illegal drugs. Some items may interact with your medicine. What should I watch for while using this medicine? Visit your doctor or health care professional for regular checks on your progress. During the first weeks of treatment, your symptoms may get worse, but then will improve as you continue your treatment. You may get hot flashes, increased bone pain, increased difficulty passing urine, or an aggravation of nerve symptoms. Discuss these effects with your doctor or health care professional, some of them may improve with continued use of this medicine. Male patients may experience a menstrual cycle or spotting during the first months of therapy with this medicine. If this continues, contact your doctor or health care professional. This medicine may increase blood sugar. Ask your healthcare provider if changes in diet or medicines are needed if you have diabetes. What side effects may I notice from receiving this medicine? Side effects that you should report to your doctor or health care professional as soon as possible:  allergic reactions like skin rash, itching or hives, swelling of the face, lips, or tongue  breathing problems  chest pain  depression or memory disorders  pain in your legs or  groin  pain at site where injected or implanted  seizures  severe headache  signs and symptoms of high blood sugar such as being more thirsty or hungry or having to urinate more than normal. You may also feel very tired or have blurry vision  swelling of the feet and legs  suicidal thoughts or other mood changes  visual changes  vomiting Side effects that usually do not require medical attention (report to your doctor or health care professional if they continue or are bothersome):  breast swelling or tenderness  decrease in sex drive or performance  diarrhea  hot flashes  loss of appetite  muscle, joint, or bone pains  nausea  redness or irritation at site where injected or implanted  skin problems or acne This list may not describe all possible side effects. Call your doctor for medical advice about side effects. You may report side effects to FDA at 1-800-FDA-1088. Where should I keep my medicine? This drug is given in a hospital or clinic and will not be  stored at home. NOTE: This sheet is a summary. It may not cover all possible information. If you have questions about this medicine, talk to your doctor, pharmacist, or health care provider.  2020 Elsevier/Gold Standard (2018-06-09 09:27:03)

## 2020-01-02 ENCOUNTER — Other Ambulatory Visit: Payer: Self-pay | Admitting: Oncology

## 2020-01-02 DIAGNOSIS — C61 Malignant neoplasm of prostate: Secondary | ICD-10-CM

## 2020-01-30 ENCOUNTER — Other Ambulatory Visit: Payer: Self-pay | Admitting: Oncology

## 2020-01-30 DIAGNOSIS — C61 Malignant neoplasm of prostate: Secondary | ICD-10-CM

## 2020-02-07 MED FILL — ABIRATERONE ACETATE 250 MG: 250 | 30 days supply | Qty: 120 | Fill #0

## 2020-03-01 ENCOUNTER — Other Ambulatory Visit: Payer: Self-pay | Admitting: Oncology

## 2020-03-01 DIAGNOSIS — C61 Malignant neoplasm of prostate: Secondary | ICD-10-CM

## 2020-03-04 MED FILL — OYSTER SHELL CALCIUM-VIT D: 500-200 | 30 days supply | Qty: 60 | Fill #3

## 2020-03-06 ENCOUNTER — Telehealth: Payer: Self-pay

## 2020-03-06 MED FILL — predniSONE 5 MG TABS: 5 | 30 days supply | Qty: 90 | Fill #0

## 2020-03-06 NOTE — Telephone Encounter (Signed)
Oral Oncology Patient Advocate Encounter  Received notification from [facility] [employer] that prior authorization for Fabio Asa is required.  PA submitted on CoverMyMeds Key [id number] Status is pending  Oral Oncology Clinic will continue to follow.  [place] Patient [place] Phone [phone] Fax [phone] 03/06/2020 1:52 PM

## 2020-03-07 MED FILL — ABIRATERONE ACETATE 250 MG: 250 | 30 days supply | Qty: 120 | Fill #0

## 2020-03-07 NOTE — Telephone Encounter (Signed)
Oral Oncology Patient Advocate Encounter  Prior Authorization for David Huffman has been approved.    PA# [id number] Effective dates: 03/06/20 through 03/06/21  Patients co-pay is $5  Oral Oncology Clinic will continue to follow.   [place] Patient [place] Phone [phone] Fax [phone] 03/07/2020 10:28 AM

## 2020-03-19 ENCOUNTER — Inpatient Hospital Stay: Payer: BC Managed Care – PPO | Attending: Oncology

## 2020-03-19 ENCOUNTER — Other Ambulatory Visit: Payer: Self-pay

## 2020-03-19 DIAGNOSIS — C7951 Secondary malignant neoplasm of bone: Secondary | ICD-10-CM | POA: Diagnosis not present

## 2020-03-19 DIAGNOSIS — C61 Malignant neoplasm of prostate: Secondary | ICD-10-CM | POA: Diagnosis present

## 2020-03-19 DIAGNOSIS — Z5111 Encounter for antineoplastic chemotherapy: Secondary | ICD-10-CM | POA: Insufficient documentation

## 2020-03-19 LAB — CMP (CANCER CENTER ONLY)
ALT: 24 U/L (ref 0–44)
AST: 26 U/L (ref 15–41)
Albumin: 3.7 g/dL (ref 3.5–5.0)
Alkaline Phosphatase: 61 U/L (ref 38–126)
Anion gap: 7 (ref 5–15)
BUN: 17 mg/dL (ref 6–20)
CO2: 26 mmol/L (ref 22–32)
Calcium: 9.7 mg/dL (ref 8.9–10.3)
Chloride: 109 mmol/L (ref 98–111)
Creatinine: 0.93 mg/dL (ref 0.61–1.24)
GFR, Est AFR Am: >60 mL/min (ref >60)
GFR, Estimated: >60 mL/min (ref >60)
Glucose, Bld: 101 mg/dL — ABNORMAL HIGH (ref 70–99)
Potassium: 4.6 mmol/L (ref 3.5–5.1)
Sodium: 142 mmol/L (ref 135–145)
Total Bilirubin: 0.9 mg/dL (ref 0.3–1.2)
Total Protein: 6.5 g/dL (ref 6.5–8.1)

## 2020-03-19 LAB — CBC WITH DIFFERENTIAL (CANCER CENTER ONLY)
Abs Immature Granulocytes: 0.01 10*3/uL (ref 0.00–0.07)
Basophils Absolute: 0 10*3/uL (ref 0.0–0.1)
Basophils Relative: 1 %
Eosinophils Absolute: 0.1 10*3/uL (ref 0.0–0.5)
Eosinophils Relative: 3 %
HCT: 41.2 % (ref 39.0–52.0)
Hemoglobin: 14.1 g/dL (ref 13.0–17.0)
Immature Granulocytes: 0 %
Lymphocytes Relative: 38 %
Lymphs Abs: 1.9 10*3/uL (ref 0.7–4.0)
MCH: 30.8 pg (ref 26.0–34.0)
MCHC: 34.2 g/dL (ref 30.0–36.0)
MCV: 90 fL (ref 80.0–100.0)
Monocytes Absolute: 0.5 10*3/uL (ref 0.1–1.0)
Monocytes Relative: 11 %
Neutro Abs: 2.4 10*3/uL (ref 1.7–7.7)
Neutrophils Relative %: 47 %
Platelet Count: 206 10*3/uL (ref 150–400)
RBC: 4.58 MIL/uL (ref 4.22–5.81)
RDW: 12.4 % (ref 11.5–15.5)
WBC Count: 5 10*3/uL (ref 4.0–10.5)
nRBC: 0 % (ref 0.0–0.2)

## 2020-03-20 LAB — PROSTATE-SPECIFIC AG, SERUM (LABCORP): Prostate Specific Ag, Serum: <0.1 ng/mL (ref 0.0–4.0)

## 2020-03-21 ENCOUNTER — Inpatient Hospital Stay: Payer: BC Managed Care – PPO

## 2020-03-21 ENCOUNTER — Inpatient Hospital Stay (HOSPITAL_BASED_OUTPATIENT_CLINIC_OR_DEPARTMENT_OTHER): Payer: BC Managed Care – PPO | Admitting: Oncology

## 2020-03-21 ENCOUNTER — Other Ambulatory Visit: Payer: Self-pay

## 2020-03-21 VITALS — BP 131/78 | HR 65 | Temp 97.3°F | Resp 16 | Ht 68.0 in | Wt 166.5 lb

## 2020-03-21 DIAGNOSIS — C61 Malignant neoplasm of prostate: Secondary | ICD-10-CM

## 2020-03-21 DIAGNOSIS — Z5111 Encounter for antineoplastic chemotherapy: Secondary | ICD-10-CM | POA: Diagnosis not present

## 2020-03-21 MED ORDER — LEUPROLIDE ACETATE (3 MONTH) 22.5 MG ~~LOC~~ KIT
22.5000 mg | PACK | Freq: Once | SUBCUTANEOUS | Status: AC
  Administered 2020-03-21: 22.5 mg via SUBCUTANEOUS

## 2020-03-21 MED ORDER — LEUPROLIDE ACETATE (3 MONTH) 22.5 MG ~~LOC~~ KIT
PACK | SUBCUTANEOUS | Status: AC
  Filled 2020-03-21: qty 22.5

## 2020-03-21 MED ORDER — DENOSUMAB 120 MG/1.7ML ~~LOC~~ SOLN
SUBCUTANEOUS | Status: AC
  Filled 2020-03-21: qty 1.7

## 2020-03-21 MED ORDER — DENOSUMAB 120 MG/1.7ML ~~LOC~~ SOLN
120.0000 mg | Freq: Once | SUBCUTANEOUS | Status: AC
  Administered 2020-03-21: 120 mg via SUBCUTANEOUS

## 2020-03-21 NOTE — Progress Notes (Signed)
Hematology and Oncology Follow Up Visit  David Huffman [id number] November 18, 1960 59 y.o. 03/21/2020 8:26 AM  CC: David Messick, MD    Principle Diagnosis: 59 year old man with prostate cancer diagnosed in 2008.  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    Prior Therapy: 1. Status post prostatectomy done in 2008, pathology revealing T2c N0 with 0 out of 12 lymph nodes involved.  2. The patient continued to have rise in his PSA up to 52 after prostatectomy without any measurable disease.  Lupron was started at that time. 3.     Casodex 50 mg daily added in 05/2011. Therapy discontinued in March 2018 because of progression of disease. 4.     Status post radiation therapy to the pelvic bone completed in June 2019 he received 50 Gy in 10 fractions.   Current therapy:  Eligard 22.5 mg every 3 months.  He will receive injection today.   Zytiga 1000 mg daily with prednisone 5 mg daily since May 2018.  Xgeva 120 mg every 3 months started on 06/20/2019.   Interim History:  David Huffman is here for a follow-up evaluation.  Since the last visit, he reports no major changes in his health.  He continues to be active and works full-time.  Continues to exercise regularly and is able to hike 25 mi over the weekend.  He does report some mild fatigue and tiredness associated with Zytiga.  No abdominal pain or distention.  No weight loss or appetite changes.         Medications: Updated on review. Current Outpatient Medications  Medication Sig Dispense Refill  . abiraterone acetate (ZYTIGA) 250 MG tablet TAKE 4 TABLETS (1000MG ) BY MOUTH DAILY ON AN EMPTY STOMACH 1 HOUR BEFORE OR 2 HOURS AFTER A MEAL 120 tablet 0  . Ascorbic Acid (VITAMIN C) 1000 MG tablet Take 1,000 mg by mouth daily.    Marland Kitchen b complex vitamins tablet Take 1 tablet by mouth daily.    . Calcium Carbonate-Vitamin D (CALCIUM + D PO) Take 1 tablet by  mouth daily.    . calcium-vitamin D (OSCAL WITH D) 500-200 MG-UNIT TABS tablet TAKE 1 TABLET BY MOUTH TWO TIMES DAILY 60 tablet 3  . leuprolide (LUPRON) 22.5 MG injection Inject 22.5 mg into the muscle every 3 (three) months.    . Melatonin 10 MG CAPS Take 12 mg 2 (two) times daily at 8 am and 10 pm by mouth.    . Multiple Vitamins-Minerals (MULTIVITAMIN PO) Take 1 tablet by mouth daily.    . Omega-3 Fatty Acids (FISH OIL) 1000 MG CAPS Take 1,000 mg by mouth daily.    . predniSONE (DELTASONE) 5 MG tablet TAKE 1 TABLET (5 MG TOTAL) BY MOUTH DAILY WITH BREAKFAST. 90 tablet 3  . VITAMIN D, ERGOCALCIFEROL, PO Take 1 tablet by mouth daily.     No current facility-administered medications for this visit.    Allergies: No Known Allergies    Physical Exam:      Blood pressure (!) 131/78, pulse 65, temperature (!) 97.3 F (36.3 C), temperature source Temporal, resp. rate 16, height 5\' 8"  (1.727 m), weight 166 lb 8 oz (75.5 kg), SpO2 100 %.      ECOG: 0   General appearance: Alert, awake without any distress. Head: Atraumatic without abnormalities Oropharynx: Without any thrush or ulcers. Eyes: No scleral icterus. Lymph nodes: No lymphadenopathy noted in the cervical, supraclavicular,  or axillary nodes Heart:regular rate and rhythm, without any murmurs or gallops.   Lung: Clear to auscultation without any rhonchi, wheezes or dullness to percussion. Abdomin: Soft, nontender without any shifting dullness or ascites. Musculoskeletal: No clubbing or cyanosis. Neurological: No motor or sensory deficits. Skin: No rashes or lesions.          Lab Results: Lab Results  Component Value Date   WBC 5.0 03/19/2020   HGB 14.1 03/19/2020   HCT 41.2 03/19/2020   MCV 90.0 03/19/2020   PLT 206 03/19/2020     Chemistry      Component Value Date/Time   NA 142 03/19/2020 0805   NA 142 08/18/2017 0754   K 4.6 03/19/2020 0805   K 4.2 08/18/2017 0754   CL 109 03/19/2020 0805   CL  106 01/03/2013 0916   CO2 26 03/19/2020 0805   CO2 25 08/18/2017 0754   BUN 17 03/19/2020 0805   BUN 14.0 08/18/2017 0754   CREATININE 0.93 03/19/2020 0805   CREATININE 1.0 08/18/2017 0754      Component Value Date/Time   CALCIUM 9.7 03/19/2020 0805   CALCIUM 9.5 08/18/2017 0754   ALKPHOS 61 03/19/2020 0805   ALKPHOS 106 08/18/2017 0754   AST 26 03/19/2020 0805   AST 31 08/18/2017 0754   ALT 24 03/19/2020 0805   ALT 39 08/18/2017 0754   BILITOT 0.9 03/19/2020 0805   BILITOT 1.26 (H) 08/18/2017 0754       Results for David Huffman (MRN [id number]) as of 03/21/2020 08:28  Ref. Range 12/19/2019 08:21 03/19/2020 08:05  Prostate Specific Ag, Serum Latest Ref Range: 0.0 - 4.0 ng/mL <0.1 <0.1      ASSESSMENT AND PLAN:   59 year old man with:  1.  Advanced prostate cancer with disease to the bone diagnosed in 18.  He has castration-resistant disease at this time.        He continues to experience excellent clinical response to therapy with PSA remains undetectable and imaging studies does not show any progression.  Risks and benefits of continuing Zytiga long-term were reviewed.  Potential complications include weight gain, edema, hypertension among others were discussed.  Alternative options such as systemic chemotherapy were reviewed which will be deferred at this time given his excellent response.   After discussion today he is agreeable to proceed.    2.  Right hip metastatic lesion: Status post treatment outlined above "radiation and currently on systemic therapy.  No recent exacerbation noted.  3. Androgen depravation: He continues to be on Eligard which I recommended continuing this indefinitely.  Long-term complication occluding osteoporosis, weight gain and loss of muscle mass.  He is agreeable to continue.  4. Bone health: He is currently on Xgeva which she has tolerated well.  Potential complication clinic hypocalcemia no surgical history of the jaw were reviewed.  Is  agreeable to continue.  5.  Covid vaccination considerations: Risks and benefits of obtaining the vaccine were discussed with him today.  I urged him to proceed with vaccination and he is seriously considering that at this time.  6. Follow-up: In 3 months for a follow-up visit.  30 minutes were dedicated to this encounter.  The time was spent on reviewing laboratory data, disease status update, treatment options and future plan of care review.  Zola Button, MD 7/29/20218:26 AM

## 2020-03-21 NOTE — Patient Instructions (Signed)
Denosumab injection What is this medicine? DENOSUMAB (den oh sue mab) slows bone breakdown. Prolia is used to treat osteoporosis in women after menopause and in men, and in people who are taking corticosteroids for 6 months or more. Xgeva is used to treat a high calcium level due to cancer and to prevent bone fractures and other bone problems caused by multiple myeloma or cancer bone metastases. Xgeva is also used to treat giant cell tumor of the bone. This medicine may be used for other purposes; ask your health care provider or pharmacist if you have questions. COMMON BRAND NAME(S): Prolia, XGEVA What should I tell my health care provider before I take this medicine? They need to know if you have any of these conditions:  dental disease  having surgery or tooth extraction  infection  kidney disease  low levels of calcium or Vitamin D in the blood  malnutrition  on hemodialysis  skin conditions or sensitivity  thyroid or parathyroid disease  an unusual reaction to denosumab, other medicines, foods, dyes, or preservatives  pregnant or trying to get pregnant  breast-feeding How should I use this medicine? This medicine is for injection under the skin. It is given by a health care professional in a hospital or clinic setting. A special MedGuide will be given to you before each treatment. Be sure to read this information carefully each time. For Prolia, talk to your pediatrician regarding the use of this medicine in children. Special care may be needed. For Xgeva, talk to your pediatrician regarding the use of this medicine in children. While this drug may be prescribed for children as young as 13 years for selected conditions, precautions do apply. Overdosage: If you think you have taken too much of this medicine contact a poison control center or emergency room at once. NOTE: This medicine is only for you. Do not share this medicine with others. What if I miss a dose? It is  important not to miss your dose. Call your doctor or health care professional if you are unable to keep an appointment. What may interact with this medicine? Do not take this medicine with any of the following medications:  other medicines containing denosumab This medicine may also interact with the following medications:  medicines that lower your chance of fighting infection  steroid medicines like prednisone or cortisone This list may not describe all possible interactions. Give your health care provider a list of all the medicines, herbs, non-prescription drugs, or dietary supplements you use. Also tell them if you smoke, drink alcohol, or use illegal drugs. Some items may interact with your medicine. What should I watch for while using this medicine? Visit your doctor or health care professional for regular checks on your progress. Your doctor or health care professional may order blood tests and other tests to see how you are doing. Call your doctor or health care professional for advice if you get a fever, chills or sore throat, or other symptoms of a cold or flu. Do not treat yourself. This drug may decrease your body's ability to fight infection. Try to avoid being around people who are sick. You should make sure you get enough calcium and vitamin D while you are taking this medicine, unless your doctor tells you not to. Discuss the foods you eat and the vitamins you take with your health care professional. See your dentist regularly. Brush and floss your teeth as directed. Before you have any dental work done, tell your dentist you are   receiving this medicine. Do not become pregnant while taking this medicine or for 5 months after stopping it. Talk with your doctor or health care professional about your birth control options while taking this medicine. Women should inform their doctor if they wish to become pregnant or think they might be pregnant. There is a potential for serious side  effects to an unborn child. Talk to your health care professional or pharmacist for more information. What side effects may I notice from receiving this medicine? Side effects that you should report to your doctor or health care professional as soon as possible:  allergic reactions like skin rash, itching or hives, swelling of the face, lips, or tongue  bone pain  breathing problems  dizziness  jaw pain, especially after dental work  redness, blistering, peeling of the skin  signs and symptoms of infection like fever or chills; cough; sore throat; pain or trouble passing urine  signs of low calcium like fast heartbeat, muscle cramps or muscle pain; pain, tingling, numbness in the hands or feet; seizures  unusual bleeding or bruising  unusually weak or tired Side effects that usually do not require medical attention (report to your doctor or health care professional if they continue or are bothersome):  constipation  diarrhea  headache  joint pain  loss of appetite  muscle pain  runny nose  tiredness  upset stomach This list may not describe all possible side effects. Call your doctor for medical advice about side effects. You may report side effects to FDA at 1-800-FDA-1088. Where should I keep my medicine? This medicine is only given in a clinic, doctor's office, or other health care setting and will not be stored at home. NOTE: This sheet is a summary. It may not cover all possible information. If you have questions about this medicine, talk to your doctor, pharmacist, or health care provider.  2020 Elsevier/Gold Standard (2017-12-17 16:10:44) Leuprolide depot injection What is this medicine? LEUPROLIDE (loo PROE lide) is a man-made protein that acts like a natural hormone in the body. It decreases testosterone in men and decreases estrogen in women. In men, this medicine is used to treat advanced prostate cancer. In women, some forms of this medicine may be used  to treat endometriosis, uterine fibroids, or other male hormone-related problems. This medicine may be used for other purposes; ask your health care provider or pharmacist if you have questions. COMMON BRAND NAME(S): Eligard, Fensolv, Lupron Depot, Lupron Depot-Ped, Viadur What should I tell my health care provider before I take this medicine? They need to know if you have any of these conditions:  diabetes  heart disease or previous heart attack  high blood pressure  high cholesterol  mental illness  osteoporosis  pain or difficulty passing urine  seizures  spinal cord metastasis  stroke  suicidal thoughts, plans, or attempt; a previous suicide attempt by you or a family member  tobacco smoker  unusual vaginal bleeding (women)  an unusual or allergic reaction to leuprolide, benzyl alcohol, other medicines, foods, dyes, or preservatives  pregnant or trying to get pregnant  breast-feeding How should I use this medicine? This medicine is for injection into a muscle or for injection under the skin. It is given by a health care professional in a hospital or clinic setting. The specific product will determine how it will be given to you. Make sure you understand which product you receive and how often you will receive it. Talk to your pediatrician regarding the use of  this medicine in children. Special care may be needed. Overdosage: If you think you have taken too much of this medicine contact a poison control center or emergency room at once. NOTE: This medicine is only for you. Do not share this medicine with others. What if I miss a dose? It is important not to miss a dose. Call your doctor or health care professional if you are unable to keep an appointment. Depot injections: Depot injections are given either once-monthly, every 12 weeks, every 16 weeks, or every 24 weeks depending on the product you are prescribed. The product you are prescribed will be based on if you  are male or male, and your condition. Make sure you understand your product and dosing. What may interact with this medicine? Do not take this medicine with any of the following medications:  chasteberry This medicine may also interact with the following medications:  herbal or dietary supplements, like black cohosh or DHEA  male hormones, like estrogens or progestins and birth control pills, patches, rings, or injections  male hormones, like testosterone This list may not describe all possible interactions. Give your health care provider a list of all the medicines, herbs, non-prescription drugs, or dietary supplements you use. Also tell them if you smoke, drink alcohol, or use illegal drugs. Some items may interact with your medicine. What should I watch for while using this medicine? Visit your doctor or health care professional for regular checks on your progress. During the first weeks of treatment, your symptoms may get worse, but then will improve as you continue your treatment. You may get hot flashes, increased bone pain, increased difficulty passing urine, or an aggravation of nerve symptoms. Discuss these effects with your doctor or health care professional, some of them may improve with continued use of this medicine. Male patients may experience a menstrual cycle or spotting during the first months of therapy with this medicine. If this continues, contact your doctor or health care professional. This medicine may increase blood sugar. Ask your healthcare provider if changes in diet or medicines are needed if you have diabetes. What side effects may I notice from receiving this medicine? Side effects that you should report to your doctor or health care professional as soon as possible:  allergic reactions like skin rash, itching or hives, swelling of the face, lips, or tongue  breathing problems  chest pain  depression or memory disorders  pain in your legs or  groin  pain at site where injected or implanted  seizures  severe headache  signs and symptoms of high blood sugar such as being more thirsty or hungry or having to urinate more than normal. You may also feel very tired or have blurry vision  swelling of the feet and legs  suicidal thoughts or other mood changes  visual changes  vomiting Side effects that usually do not require medical attention (report to your doctor or health care professional if they continue or are bothersome):  breast swelling or tenderness  decrease in sex drive or performance  diarrhea  hot flashes  loss of appetite  muscle, joint, or bone pains  nausea  redness or irritation at site where injected or implanted  skin problems or acne This list may not describe all possible side effects. Call your doctor for medical advice about side effects. You may report side effects to FDA at 1-800-FDA-1088. Where should I keep my medicine? This drug is given in a hospital or clinic and will not be  stored at home. NOTE: This sheet is a summary. It may not cover all possible information. If you have questions about this medicine, talk to your doctor, pharmacist, or health care provider.  2020 Elsevier/Gold Standard (2018-06-09 09:27:03)

## 2020-04-02 ENCOUNTER — Telehealth: Payer: Self-pay | Admitting: Oncology

## 2020-04-02 NOTE — Telephone Encounter (Signed)
Scheduled per 07/28 los, patient has been called and voicemail was left. 

## 2020-04-08 ENCOUNTER — Other Ambulatory Visit: Payer: Self-pay | Admitting: Oncology

## 2020-04-08 ENCOUNTER — Other Ambulatory Visit (HOSPITAL_COMMUNITY): Payer: Self-pay | Admitting: Oncology

## 2020-04-08 DIAGNOSIS — C61 Malignant neoplasm of prostate: Secondary | ICD-10-CM

## 2020-04-08 MED FILL — OYSTER SHELL CALCIUM-VIT D: 500-200 | 30 days supply | Qty: 60 | Fill #0

## 2020-04-10 MED FILL — ABIRATERONE ACETATE 250 MG: 250 | 30 days supply | Qty: 120 | Fill #0

## 2020-05-03 ENCOUNTER — Other Ambulatory Visit: Payer: Self-pay | Admitting: Oncology

## 2020-05-03 DIAGNOSIS — C61 Malignant neoplasm of prostate: Secondary | ICD-10-CM

## 2020-05-07 MED FILL — ABIRATERONE ACETATE 250 MG: 250 | 30 days supply | Qty: 120 | Fill #0

## 2020-05-07 MED FILL — OYSTER SHELL CALCIUM-VIT D: 500-200 | 30 days supply | Qty: 60 | Fill #1

## 2020-05-30 ENCOUNTER — Other Ambulatory Visit: Payer: Self-pay | Admitting: Oncology

## 2020-05-30 DIAGNOSIS — C61 Malignant neoplasm of prostate: Secondary | ICD-10-CM

## 2020-06-03 ENCOUNTER — Encounter: Payer: Self-pay | Admitting: Family Medicine

## 2020-06-03 ENCOUNTER — Ambulatory Visit (INDEPENDENT_AMBULATORY_CARE_PROVIDER_SITE_OTHER): Payer: BC Managed Care – PPO | Admitting: Family Medicine

## 2020-06-03 ENCOUNTER — Other Ambulatory Visit: Payer: Self-pay

## 2020-06-03 VITALS — BP 109/76 | HR 61 | Temp 97.7°F | Ht 68.0 in | Wt 165.0 lb

## 2020-06-03 DIAGNOSIS — Z0000 Encounter for general adult medical examination without abnormal findings: Secondary | ICD-10-CM | POA: Diagnosis not present

## 2020-06-03 DIAGNOSIS — Z23 Encounter for immunization: Secondary | ICD-10-CM | POA: Diagnosis not present

## 2020-06-03 DIAGNOSIS — Z114 Encounter for screening for human immunodeficiency virus [HIV]: Secondary | ICD-10-CM

## 2020-06-03 DIAGNOSIS — Z1159 Encounter for screening for other viral diseases: Secondary | ICD-10-CM

## 2020-06-03 NOTE — Patient Instructions (Addendum)
-routine lab work today -don't need to see me for a year unless sick.  -consider covid vaccine   Preventive Care 21-57 Years Old, Male Preventive care refers to lifestyle choices and visits with your health care provider that can promote health and wellness. This includes:  A yearly physical exam. This is also called an annual well check.  Regular dental and eye exams.  Immunizations.  Screening for certain conditions.  Healthy lifestyle choices, such as eating a healthy diet, getting regular exercise, not using drugs or products that contain nicotine and tobacco, and limiting alcohol use. What can I expect for my preventive care visit? Physical exam Your health care provider will check:  Height and weight. These may be used to calculate body mass index (BMI), which is a measurement that tells if you are at a healthy weight.  Heart rate and blood pressure.  Your skin for abnormal spots. Counseling Your health care provider may ask you questions about:  Alcohol, tobacco, and drug use.  Emotional well-being.  Home and relationship well-being.  Sexual activity.  Eating habits.  Work and work Statistician. What immunizations do I need?  Influenza (flu) vaccine  This is recommended every year. Tetanus, diphtheria, and pertussis (Tdap) vaccine  You may need a Td booster every 10 years. Varicella (chickenpox) vaccine  You may need this vaccine if you have not already been vaccinated. Zoster (shingles) vaccine  You may need this after age 48. Measles, mumps, and rubella (MMR) vaccine  You may need at least one dose of MMR if you were born in 1957 or later. You may also need a second dose. Pneumococcal conjugate (PCV13) vaccine  You may need this if you have certain conditions and were not previously vaccinated. Pneumococcal polysaccharide (PPSV23) vaccine  You may need one or two doses if you smoke cigarettes or if you have certain conditions. Meningococcal  conjugate (MenACWY) vaccine  You may need this if you have certain conditions. Hepatitis A vaccine  You may need this if you have certain conditions or if you travel or work in places where you may be exposed to hepatitis A. Hepatitis B vaccine  You may need this if you have certain conditions or if you travel or work in places where you may be exposed to hepatitis B. Haemophilus influenzae type b (Hib) vaccine  You may need this if you have certain risk factors. Human papillomavirus (HPV) vaccine  If recommended by your health care provider, you may need three doses over 6 months. You may receive vaccines as individual doses or as more than one vaccine together in one shot (combination vaccines). Talk with your health care provider about the risks and benefits of combination vaccines. What tests do I need? Blood tests  Lipid and cholesterol levels. These may be checked every 5 years, or more frequently if you are over 31 years old.  Hepatitis C test.  Hepatitis B test. Screening  Lung cancer screening. You may have this screening every year starting at age 90 if you have a 30-pack-year history of smoking and currently smoke or have quit within the past 15 years.  Prostate cancer screening. Recommendations will vary depending on your family history and other risks.  Colorectal cancer screening. All adults should have this screening starting at age 46 and continuing until age 94. Your health care provider may recommend screening at age 6 if you are at increased risk. You will have tests every 1-10 years, depending on your results and the type  of screening test.  Diabetes screening. This is done by checking your blood sugar (glucose) after you have not eaten for a while (fasting). You may have this done every 1-3 years.  Sexually transmitted disease (STD) testing. Follow these instructions at home: Eating and drinking  Eat a diet that includes fresh fruits and vegetables, whole  grains, lean protein, and low-fat dairy products.  Take vitamin and mineral supplements as recommended by your health care provider.  Do not drink alcohol if your health care provider tells you not to drink.  If you drink alcohol: ? Limit how much you have to 0-2 drinks a day. ? Be aware of how much alcohol is in your drink. In the [place] one drink equals one 12 oz bottle of beer (355 mL), one 5 oz glass of wine (148 mL), or one 1 oz glass of hard liquor (44 mL). Lifestyle  Take daily care of your teeth and gums.  Stay active. Exercise for at least 30 minutes on 5 or more days each week.  Do not use any products that contain nicotine or tobacco, such as cigarettes, e-cigarettes, and chewing tobacco. If you need help quitting, ask your health care provider.  If you are sexually active, practice safe sex. Use a condom or other form of protection to prevent STIs (sexually transmitted infections).  Talk with your health care provider about taking a low-dose aspirin every day starting at age 16. What's next?  Go to your health care provider once a year for a well check visit.  Ask your health care provider how often you should have your eyes and teeth checked.  Stay up to date on all vaccines. This information is not intended to replace advice given to you by your health care provider. Make sure you discuss any questions you have with your health care provider. Document Revised: 08/04/2018 Document Reviewed: 08/04/2018 Elsevier Patient Education  2020 [employer].

## 2020-06-03 NOTE — Progress Notes (Signed)
Patient: David Huffman MRN: [id number] DOB: April 28, 1961 PCP: Orma Flaming, MD     Subjective:  Chief Complaint  Patient presents with  . Establish Care    HPI: The patient is a 59 y.o. male who presents today for annual exam. He denies any changes to past medical history. There have been no recent hospitalizations. They are following a well balanced diet and exercise plan. He walks and rides his bike. He also runs. Weight has been stable. Pt says that he had radiation therapy 2019. He is accompanied by his wife today.  There is no immunization history on file for this patient.  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 Otherwise very healthy.   Review of Systems  Constitutional: Negative for chills, fatigue and fever.  HENT: Negative for congestion, dental problem, ear pain, hearing loss, sore throat and trouble swallowing.   Eyes: Negative for visual disturbance.  Respiratory: Negative for cough, chest tightness, shortness of breath and wheezing.   Cardiovascular: Negative for chest pain, palpitations and leg swelling.  Gastrointestinal: Negative for abdominal pain, blood in stool, diarrhea and nausea.  Endocrine: Negative for cold intolerance, polydipsia, polyphagia and polyuria.  Genitourinary: Negative for dysuria and hematuria.  Musculoskeletal: Negative for arthralgias.  Skin: Negative for rash.  Neurological: Negative for dizziness, light-headedness and headaches.  Psychiatric/Behavioral: Negative for dysphoric mood and sleep disturbance. The patient is not nervous/anxious.     Allergies Patient has No Known Allergies.  Past Medical History Patient  has a past medical history of Prostate cancer ([place]) (08/2006).  Surgical History Patient  has a past surgical history that includes Vasectomy; Prostate biopsy; and Prostatectomy.  Family History Pateint's family history includes Cancer in his paternal grandfather  and paternal uncle; Cancer (age of onset: 65) in his father.  Social History Patient  reports that he has never smoked. He has never used smokeless tobacco. He reports current alcohol use. He reports that he does not use drugs.    Objective: Vitals:   06/03/20 0931  BP: 109/76  Pulse: 61  Temp: 97.7 F (36.5 C)  TempSrc: Temporal  SpO2: 99%  Weight: 165 lb (74.8 kg)  Height: 5\' 8"  (1.727 m)    Body mass index is 25.09 kg/m.  Physical Exam Vitals reviewed.  Constitutional:      Appearance: Normal appearance. He is well-developed and normal weight.  HENT:     Head: Normocephalic and atraumatic.     Right Ear: Tympanic membrane, ear canal and external ear normal.     Left Ear: Tympanic membrane, ear canal and external ear normal.     Mouth/Throat:     Mouth: Mucous membranes are moist.  Eyes:     Extraocular Movements: Extraocular movements intact.     Conjunctiva/sclera: Conjunctivae normal.     Pupils: Pupils are equal, round, and reactive to light.  Neck:     Thyroid: No thyromegaly.     Vascular: No carotid bruit.  Cardiovascular:     Rate and Rhythm: Normal rate and regular rhythm.     Pulses: Normal pulses.     Heart sounds: Normal heart sounds. No murmur heard.   Pulmonary:     Effort: Pulmonary effort is normal.     Breath sounds: Normal breath sounds.  Abdominal:     General: Abdomen is flat. Bowel sounds are normal. There is no distension.     Palpations: Abdomen is soft.     Tenderness: There is no  abdominal tenderness.  Musculoskeletal:     Cervical back: Normal range of motion and neck supple.  Lymphadenopathy:     Cervical: No cervical adenopathy.  Skin:    General: Skin is warm and dry.     Capillary Refill: Capillary refill takes less than 2 seconds.     Findings: No rash.  Neurological:     General: No focal deficit present.     Mental Status: He is alert and oriented to person, place, and time.     Cranial Nerves: No cranial nerve deficit.      Coordination: Coordination normal.     Deep Tendon Reflexes: Reflexes normal.  Psychiatric:        Mood and Affect: Mood normal.        Behavior: Behavior normal.      Office Visit from 06/03/2020 in [facility]  PHQ-2 Total Score 0         Assessment/plan: 1. Annual physical exam Routine labs today. Hm reviewed and tdap will be given today. Discussed covid and really want him to think about shot as he is more at risk with  Prostate cancer. HM otherwise up to date. Continue healthy lifestyle/exercise.  Patient counseling [x]    Nutrition: Stressed importance of moderation in sodium/caffeine intake, saturated fat and cholesterol, caloric balance, sufficient intake of fresh fruits, vegetables, fiber, calcium, iron, and 1 mg of folate supplement per day (for females capable of pregnancy).  [x]    Stressed the importance of regular exercise.   []    Substance Abuse: Discussed cessation/primary prevention of tobacco, alcohol, or other drug use; driving or other dangerous activities under the influence; availability of treatment for abuse.   [x]    Injury prevention: Discussed safety belts, safety helmets, smoke detector, smoking near bedding or upholstery.   [x]    Sexuality: Discussed sexually transmitted diseases, partner selection, use of condoms, avoidance of unintended pregnancy  and contraceptive alternatives.  [x]    Dental health: Discussed importance of regular tooth brushing, flossing, and dental visits.  [x]    Health maintenance and immunizations reviewed. Please refer to Health maintenance section.    - CBC with Differential/Platelet; Future - Lipid panel; Future - TSH; Future - COMPLETE METABOLIC PANEL WITH GFR; Future - COMPLETE METABOLIC PANEL WITH GFR - TSH - Lipid panel - CBC with Differential/Platelet  2. Encounter for hepatitis C screening test for low risk patient  - Hepatitis C antibody; Future - Hepatitis C antibody  3. Encounter for  screening for HIV  - HIV Antibody (routine testing w rflx); Future - HIV Antibody (routine testing w rflx)  4. Need for Tdap vaccination  - Tdap vaccine greater than or equal to 7yo IM    This visit occurred during the SARS-CoV-2 public health emergency.  Safety protocols were in place, including screening questions prior to the visit, additional usage of staff PPE, and extensive cleaning of exam room while observing appropriate contact time as indicated for disinfecting solutions.     Return in about 1 year (around 06/03/2021) for annual .     Orma Flaming, MD [place]  06/03/2020

## 2020-06-04 LAB — COMPLETE METABOLIC PANEL WITH GFR
AG Ratio: 2 (calc) (ref 1.0–2.5)
ALT: 19 U/L (ref 9–46)
AST: 20 U/L (ref 10–35)
Albumin: 4.4 g/dL (ref 3.6–5.1)
Alkaline phosphatase (APISO): 67 U/L (ref 35–144)
BUN: 18 mg/dL (ref 7–25)
CO2: 31 mmol/L (ref 20–32)
Calcium: 10.3 mg/dL (ref 8.6–10.3)
Chloride: 105 mmol/L (ref 98–110)
Creat: 1 mg/dL (ref 0.70–1.33)
GFR, Est African American: 96 mL/min/{1.73_m2} (ref >=60)
GFR, Est Non African American: 83 mL/min/{1.73_m2} (ref >=60)
Globulin: 2.2 g/dL (calc) (ref 1.9–3.7)
Glucose, Bld: 78 mg/dL (ref 65–99)
Potassium: 4.4 mmol/L (ref 3.5–5.3)
Sodium: 141 mmol/L (ref 135–146)
Total Bilirubin: 1.2 mg/dL (ref 0.2–1.2)
Total Protein: 6.6 g/dL (ref 6.1–8.1)

## 2020-06-04 LAB — CBC WITH DIFFERENTIAL/PLATELET
Absolute Monocytes: 397 cells/uL (ref 200–950)
Basophils Absolute: 32 cells/uL (ref 0–200)
Basophils Relative: 0.5 %
Eosinophils Absolute: 120 cells/uL (ref 15–500)
Eosinophils Relative: 1.9 %
HCT: 43.7 % (ref 38.5–50.0)
Hemoglobin: 14.9 g/dL (ref 13.2–17.1)
Lymphs Abs: 1285 cells/uL (ref 850–3900)
MCH: 30.7 pg (ref 27.0–33.0)
MCHC: 34.1 g/dL (ref 32.0–36.0)
MCV: 89.9 fL (ref 80.0–100.0)
MPV: 10.2 fL (ref 7.5–12.5)
Monocytes Relative: 6.3 %
Neutro Abs: 4467 cells/uL (ref 1500–7800)
Neutrophils Relative %: 70.9 %
Platelets: 257 10*3/uL (ref 140–400)
RBC: 4.86 10*6/uL (ref 4.20–5.80)
RDW: 12.5 % (ref 11.0–15.0)
Total Lymphocyte: 20.4 %
WBC: 6.3 10*3/uL (ref 3.8–10.8)

## 2020-06-04 LAB — LIPID PANEL
Cholesterol: 192 mg/dL (ref <200)
HDL: 57 mg/dL (ref >=40)
LDL Cholesterol (Calc): 107 mg/dL (calc) — ABNORMAL HIGH
Non-HDL Cholesterol (Calc): 135 mg/dL (calc) — ABNORMAL HIGH (ref <130)
Total CHOL/HDL Ratio: 3.4 (calc) (ref <5.0)
Triglycerides: 160 mg/dL — ABNORMAL HIGH (ref <150)

## 2020-06-04 LAB — TSH: TSH: 2.51 mIU/L (ref 0.40–4.50)

## 2020-06-04 LAB — HEPATITIS C ANTIBODY
Hepatitis C Ab: NONREACTIVE
SIGNAL TO CUT-OFF: 0.01 (ref <1.00)

## 2020-06-04 LAB — HIV ANTIBODY (ROUTINE TESTING W REFLEX): HIV 1&2 Ab, 4th Generation: NONREACTIVE

## 2020-06-06 MED FILL — ABIRATERONE ACETATE 250 MG: 250 | 30 days supply | Qty: 120 | Fill #0

## 2020-06-14 MED FILL — predniSONE 5 MG TABS: 5 | 30 days supply | Qty: 90 | Fill #1

## 2020-06-14 MED FILL — OYSTER SHELL CALCIUM-VIT D: 500-200 | 30 days supply | Qty: 60 | Fill #2

## 2020-06-18 ENCOUNTER — Other Ambulatory Visit: Payer: Self-pay

## 2020-06-18 ENCOUNTER — Inpatient Hospital Stay: Payer: BC Managed Care – PPO | Attending: Oncology

## 2020-06-18 DIAGNOSIS — Z7952 Long term (current) use of systemic steroids: Secondary | ICD-10-CM | POA: Insufficient documentation

## 2020-06-18 DIAGNOSIS — C61 Malignant neoplasm of prostate: Secondary | ICD-10-CM | POA: Diagnosis present

## 2020-06-18 DIAGNOSIS — Z5111 Encounter for antineoplastic chemotherapy: Secondary | ICD-10-CM | POA: Diagnosis not present

## 2020-06-18 DIAGNOSIS — Z9079 Acquired absence of other genital organ(s): Secondary | ICD-10-CM | POA: Diagnosis not present

## 2020-06-18 DIAGNOSIS — Z79899 Other long term (current) drug therapy: Secondary | ICD-10-CM | POA: Diagnosis not present

## 2020-06-18 DIAGNOSIS — Z79890 Hormone replacement therapy (postmenopausal): Secondary | ICD-10-CM | POA: Diagnosis not present

## 2020-06-18 DIAGNOSIS — Z923 Personal history of irradiation: Secondary | ICD-10-CM | POA: Insufficient documentation

## 2020-06-18 DIAGNOSIS — C7951 Secondary malignant neoplasm of bone: Secondary | ICD-10-CM | POA: Insufficient documentation

## 2020-06-18 LAB — CMP (CANCER CENTER ONLY)
ALT: 29 U/L (ref 0–44)
AST: 29 U/L (ref 15–41)
Albumin: 4.1 g/dL (ref 3.5–5.0)
Alkaline Phosphatase: 76 U/L (ref 38–126)
Anion gap: 8 (ref 5–15)
BUN: 16 mg/dL (ref 6–20)
CO2: 27 mmol/L (ref 22–32)
Calcium: 9.8 mg/dL (ref 8.9–10.3)
Chloride: 107 mmol/L (ref 98–111)
Creatinine: 0.93 mg/dL (ref 0.61–1.24)
GFR, Estimated: >60 mL/min (ref >60)
Glucose, Bld: 88 mg/dL (ref 70–99)
Potassium: 4.3 mmol/L (ref 3.5–5.1)
Sodium: 142 mmol/L (ref 135–145)
Total Bilirubin: 1.9 mg/dL — ABNORMAL HIGH (ref 0.3–1.2)
Total Protein: 7.1 g/dL (ref 6.5–8.1)

## 2020-06-18 LAB — CBC WITH DIFFERENTIAL (CANCER CENTER ONLY)
Abs Immature Granulocytes: 0.01 10*3/uL (ref 0.00–0.07)
Basophils Absolute: 0 10*3/uL (ref 0.0–0.1)
Basophils Relative: 1 %
Eosinophils Absolute: 0.1 10*3/uL (ref 0.0–0.5)
Eosinophils Relative: 2 %
HCT: 42.1 % (ref 39.0–52.0)
Hemoglobin: 14.3 g/dL (ref 13.0–17.0)
Immature Granulocytes: 0 %
Lymphocytes Relative: 24 %
Lymphs Abs: 1.2 10*3/uL (ref 0.7–4.0)
MCH: 30 pg (ref 26.0–34.0)
MCHC: 34 g/dL (ref 30.0–36.0)
MCV: 88.4 fL (ref 80.0–100.0)
Monocytes Absolute: 0.4 10*3/uL (ref 0.1–1.0)
Monocytes Relative: 7 %
Neutro Abs: 3.5 10*3/uL (ref 1.7–7.7)
Neutrophils Relative %: 66 %
Platelet Count: 250 10*3/uL (ref 150–400)
RBC: 4.76 MIL/uL (ref 4.22–5.81)
RDW: 12.2 % (ref 11.5–15.5)
WBC Count: 5.2 10*3/uL (ref 4.0–10.5)
nRBC: 0 % (ref 0.0–0.2)

## 2020-06-19 LAB — PROSTATE-SPECIFIC AG, SERUM (LABCORP): Prostate Specific Ag, Serum: <0.1 ng/mL (ref 0.0–4.0)

## 2020-06-20 ENCOUNTER — Inpatient Hospital Stay: Payer: BC Managed Care – PPO | Admitting: Oncology

## 2020-06-20 ENCOUNTER — Other Ambulatory Visit: Payer: Self-pay

## 2020-06-20 ENCOUNTER — Inpatient Hospital Stay: Payer: BC Managed Care – PPO

## 2020-06-20 VITALS — BP 125/79 | HR 61 | Temp 95.6°F | Resp 18 | Ht 68.0 in | Wt 166.7 lb

## 2020-06-20 DIAGNOSIS — C61 Malignant neoplasm of prostate: Secondary | ICD-10-CM | POA: Diagnosis not present

## 2020-06-20 DIAGNOSIS — Z5111 Encounter for antineoplastic chemotherapy: Secondary | ICD-10-CM | POA: Diagnosis not present

## 2020-06-20 MED ORDER — LEUPROLIDE ACETATE (3 MONTH) 22.5 MG ~~LOC~~ KIT
PACK | SUBCUTANEOUS | Status: AC
  Filled 2020-06-20: qty 22.5

## 2020-06-20 MED ORDER — DENOSUMAB 120 MG/1.7ML ~~LOC~~ SOLN
SUBCUTANEOUS | Status: AC
  Filled 2020-06-20: qty 1.7

## 2020-06-20 MED ORDER — DENOSUMAB 120 MG/1.7ML ~~LOC~~ SOLN
120.0000 mg | Freq: Once | SUBCUTANEOUS | Status: AC
  Administered 2020-06-20: 120 mg via SUBCUTANEOUS

## 2020-06-20 MED ORDER — LEUPROLIDE ACETATE (3 MONTH) 22.5 MG ~~LOC~~ KIT
22.5000 mg | PACK | Freq: Once | SUBCUTANEOUS | Status: AC
  Administered 2020-06-20: 22.5 mg via SUBCUTANEOUS

## 2020-06-20 NOTE — Progress Notes (Signed)
Hematology and Oncology Follow Up Visit  David Huffman [id number] [birth year] 59 y.o. 06/20/2020 8:53 AM  CC: David Messick, MD    Principle Diagnosis: 59 year old man with castration-resistant prostate cancer documented bone disease in 2018.  He initially presented with Gleason score of 7 and a PSA of 31 2008.   Prior Therapy: 1. Status post prostatectomy done in 2008, pathology revealing T2c N0 with 0 out of 12 lymph nodes involved.  2. The patient continued to have rise in his PSA up to 52 after prostatectomy without any measurable disease.  Lupron was started at that time. 3.     Casodex 50 mg daily added in 05/2011. Therapy discontinued in March 2018 because of progression of disease. 4.     Status post radiation therapy to the pelvic bone completed in June 2019 he received 50 Gy in 10 fractions.   Current therapy:  Eligard 22.5 mg every 3 months.   He will receive 1 today and repeated in 3 months.  Zytiga 1000 mg daily with prednisone 5 mg daily since May 2018.  Xgeva 120 mg every 3 months started on 06/20/2019.   Interim History:  David Huffman returns today for a follow-up visit.  Since her last visit, he reports no major changes in his health.  He has tolerated Zytiga without any new complaints.  He denies any excessive fatigue tiredness or pathological fractures.  Continues to exercise regularly without any decline in ability to do so.        Medications: Unchanged on review. Current Outpatient Medications  Medication Sig Dispense Refill  . abiraterone acetate (ZYTIGA) 250 MG tablet TAKE 4 TABLETS (1000MG ) BY MOUTH DAILY ON AN EMPTY STOMACH 1 HOUR BEFORE OR 2 HOURS AFTER A MEAL 120 tablet 0  . Ascorbic Acid (VITAMIN C) 1000 MG tablet Take 1,000 mg by mouth daily.    Marland Kitchen b complex vitamins tablet Take 1 tablet by mouth daily.    . Calcium Carbonate-Vitamin D (CALCIUM + D PO) Take 1 tablet by mouth daily. (Patient not taking: Reported on 06/03/2020)    . calcium-vitamin D (OSCAL  WITH D) 500-200 MG-UNIT TABS tablet TAKE 1 TABLET BY MOUTH TWO TIMES DAILY 60 tablet 3  . denosumab (XGEVA) 120 MG/1.7ML SOLN injection Inject 120 mg into the skin once.    Marland Kitchen leuprolide (LUPRON) 22.5 MG injection Inject 22.5 mg into the muscle every 3 (three) months.    . Melatonin 10 MG CAPS Take 12 mg 2 (two) times daily at 8 am and 10 pm by mouth. (Patient not taking: Reported on 06/03/2020)    . Multiple Vitamins-Minerals (MULTIVITAMIN PO) Take 1 tablet by mouth daily. (Patient not taking: Reported on 06/03/2020)    . Omega-3 Fatty Acids (FISH OIL) 1000 MG CAPS Take 1,000 mg by mouth daily. (Patient not taking: Reported on 06/03/2020)    . predniSONE (DELTASONE) 5 MG tablet TAKE 1 TABLET (5 MG TOTAL) BY MOUTH DAILY WITH BREAKFAST. 90 tablet 3  . VITAMIN D, ERGOCALCIFEROL, PO Take 1 tablet by mouth daily.     No current facility-administered medications for this visit.    Allergies: No Known Allergies    Physical Exam:      Blood pressure 125/79, pulse 61, temperature (!) 95.6 F (35.3 C), temperature source Tympanic, resp. rate 18, height 5\' 8"  (1.727 m), weight 166 lb 11.2 oz (75.6 kg), SpO2 98 %.      ECOG: 0     General appearance: Comfortable appearing without any discomfort Head:  Normocephalic without any trauma Oropharynx: Mucous membranes are moist and pink without any thrush or ulcers. Eyes: Pupils are equal and round reactive to light. Lymph nodes: No cervical, supraclavicular, inguinal or axillary lymphadenopathy.   Heart:regular rate and rhythm.  S1 and S2 without leg edema. Lung: Clear without any rhonchi or wheezes.  No dullness to percussion. Abdomin: Soft, nontender, nondistended with good bowel sounds.  No hepatosplenomegaly. Musculoskeletal: No joint deformity or effusion.  Full range of motion noted. Neurological: No deficits noted on motor, sensory and deep tendon reflex exam. Skin: No petechial rash or dryness.  Appeared moist.              Lab Results: Lab Results  Component Value Date   WBC 5.2 06/18/2020   HGB 14.3 06/18/2020   HCT 42.1 06/18/2020   MCV 88.4 06/18/2020   PLT 250 06/18/2020     Chemistry      Component Value Date/Time   NA 142 06/18/2020 1120   NA 142 08/18/2017 0754   K 4.3 06/18/2020 1120   K 4.2 08/18/2017 0754   CL 107 06/18/2020 1120   CL 106 01/03/2013 0916   CO2 27 06/18/2020 1120   CO2 25 08/18/2017 0754   BUN 16 06/18/2020 1120   BUN 14.0 08/18/2017 0754   CREATININE 0.93 06/18/2020 1120   CREATININE 1.00 06/03/2020 1047   CREATININE 1.0 08/18/2017 0754      Component Value Date/Time   CALCIUM 9.8 06/18/2020 1120   CALCIUM 9.5 08/18/2017 0754   ALKPHOS 76 06/18/2020 1120   ALKPHOS 106 08/18/2017 0754   AST 29 06/18/2020 1120   AST 31 08/18/2017 0754   ALT 29 06/18/2020 1120   ALT 39 08/18/2017 0754   BILITOT 1.9 (H) 06/18/2020 1120   BILITOT 1.26 (H) 08/18/2017 0754        Results for David Huffman (MRN [id number]) as of 06/20/2020 08:54  Ref. Range 06/18/2020 11:20  Prostate Specific Ag, Serum Latest Ref Range: 0.0 - 4.0 ng/mL <0.1      ASSESSMENT AND PLAN:   59 year old man with:  1.  Castration-resistant prostate cancer with disease to the bone diagnosed in 2018.     He is currently on Zytiga which she has tolerated very well without any recent complications.  His PSA continues to be undetectable.  Imaging studies obtained in January 2021 which showed near complete response to therapy.  Risks and benefits of continuing this current therapy were discussed today.  Alternative treatment options such as systemic chemotherapy will be deferred unless he has progression of disease.  He is agreeable at this time.    2.  Right hip metastatic lesion: Completed radiation therapy without any residual complications at this time.  Imaging studies showed stable disease.  3. Androgen depravation: He is currently on Eligard which she has tolerated very  well.  This will be repeated once every 3 weeks.  Long-term complications including osteoporosis and hot flashes were reiterated.  4. Bone health: He remains on Xgeva without any issues.  Complications including osteonecrosis of the jaw and hypocalcemia were reiterated.   5. Follow-up: He will return in 3 months for repeat follow-up.  30 minutes were spent on this visit.  The time was dedicated to reviewing his disease status, reviewing laboratory data and outlining future plan of care.  Zola Button, MD 10/28/20218:53 AM

## 2020-06-20 NOTE — Patient Instructions (Signed)
Leuprolide depot injection What is this medicine? LEUPROLIDE (loo PROE lide) is a man-made protein that acts like a natural hormone in the body. It decreases testosterone in men and decreases estrogen in women. In men, this medicine is used to treat advanced prostate cancer. In women, some forms of this medicine may be used to treat endometriosis, uterine fibroids, or other male hormone-related problems. This medicine may be used for other purposes; ask your health care provider or pharmacist if you have questions. COMMON BRAND NAME(S): Eligard, Fensolv, Lupron Depot, Lupron Depot-Ped, Viadur What should I tell my health care provider before I take this medicine? They need to know if you have any of these conditions:  diabetes  heart disease or previous heart attack  high blood pressure  high cholesterol  mental illness  osteoporosis  pain or difficulty passing urine  seizures  spinal cord metastasis  stroke  suicidal thoughts, plans, or attempt; a previous suicide attempt by you or a family member  tobacco smoker  unusual vaginal bleeding (women)  an unusual or allergic reaction to leuprolide, benzyl alcohol, other medicines, foods, dyes, or preservatives  pregnant or trying to get pregnant  breast-feeding How should I use this medicine? This medicine is for injection into a muscle or for injection under the skin. It is given by a health care professional in a hospital or clinic setting. The specific product will determine how it will be given to you. Make sure you understand which product you receive and how often you will receive it. Talk to your pediatrician regarding the use of this medicine in children. Special care may be needed. Overdosage: If you think you have taken too much of this medicine contact a poison control center or emergency room at once. NOTE: This medicine is only for you. Do not share this medicine with others. What if I miss a dose? It is  important not to miss a dose. Call your doctor or health care professional if you are unable to keep an appointment. Depot injections: Depot injections are given either once-monthly, every 12 weeks, every 16 weeks, or every 24 weeks depending on the product you are prescribed. The product you are prescribed will be based on if you are male or male, and your condition. Make sure you understand your product and dosing. What may interact with this medicine? Do not take this medicine with any of the following medications:  chasteberry This medicine may also interact with the following medications:  herbal or dietary supplements, like black cohosh or DHEA  male hormones, like estrogens or progestins and birth control pills, patches, rings, or injections  male hormones, like testosterone This list may not describe all possible interactions. Give your health care provider a list of all the medicines, herbs, non-prescription drugs, or dietary supplements you use. Also tell them if you smoke, drink alcohol, or use illegal drugs. Some items may interact with your medicine. What should I watch for while using this medicine? Visit your doctor or health care professional for regular checks on your progress. During the first weeks of treatment, your symptoms may get worse, but then will improve as you continue your treatment. You may get hot flashes, increased bone pain, increased difficulty passing urine, or an aggravation of nerve symptoms. Discuss these effects with your doctor or health care professional, some of them may improve with continued use of this medicine. Male patients may experience a menstrual cycle or spotting during the first months of therapy with this medicine.  If this continues, contact your doctor or health care professional. This medicine may increase blood sugar. Ask your healthcare provider if changes in diet or medicines are needed if you have diabetes. What side effects may I  notice from receiving this medicine? Side effects that you should report to your doctor or health care professional as soon as possible:  allergic reactions like skin rash, itching or hives, swelling of the face, lips, or tongue  breathing problems  chest pain  depression or memory disorders  pain in your legs or groin  pain at site where injected or implanted  seizures  severe headache  signs and symptoms of high blood sugar such as being more thirsty or hungry or having to urinate more than normal. You may also feel very tired or have blurry vision  swelling of the feet and legs  suicidal thoughts or other mood changes  visual changes  vomiting Side effects that usually do not require medical attention (report to your doctor or health care professional if they continue or are bothersome):  breast swelling or tenderness  decrease in sex drive or performance  diarrhea  hot flashes  loss of appetite  muscle, joint, or bone pains  nausea  redness or irritation at site where injected or implanted  skin problems or acne This list may not describe all possible side effects. Call your doctor for medical advice about side effects. You may report side effects to FDA at 1-800-FDA-1088. Where should I keep my medicine? This drug is given in a hospital or clinic and will not be stored at home. NOTE: This sheet is a summary. It may not cover all possible information. If you have questions about this medicine, talk to your doctor, pharmacist, or health care provider.  2020 Elsevier/Gold Standard (2018-06-09 09:27:03) Denosumab injection What is this medicine? DENOSUMAB (den oh sue mab) slows bone breakdown. Prolia is used to treat osteoporosis in women after menopause and in men, and in people who are taking corticosteroids for 6 months or more. Delton See is used to treat a high calcium level due to cancer and to prevent bone fractures and other bone problems caused by multiple  myeloma or cancer bone metastases. Delton See is also used to treat giant cell tumor of the bone. This medicine may be used for other purposes; ask your health care provider or pharmacist if you have questions. COMMON BRAND NAME(S): Prolia, XGEVA What should I tell my health care provider before I take this medicine? They need to know if you have any of these conditions:  dental disease  having surgery or tooth extraction  infection  kidney disease  low levels of calcium or Vitamin D in the blood  malnutrition  on hemodialysis  skin conditions or sensitivity  thyroid or parathyroid disease  an unusual reaction to denosumab, other medicines, foods, dyes, or preservatives  pregnant or trying to get pregnant  breast-feeding How should I use this medicine? This medicine is for injection under the skin. It is given by a health care professional in a hospital or clinic setting. A special MedGuide will be given to you before each treatment. Be sure to read this information carefully each time. For Prolia, talk to your pediatrician regarding the use of this medicine in children. Special care may be needed. For Delton See, talk to your pediatrician regarding the use of this medicine in children. While this drug may be prescribed for children as young as 13 years for selected conditions, precautions do apply. Overdosage:  If you think you have taken too much of this medicine contact a poison control center or emergency room at once. NOTE: This medicine is only for you. Do not share this medicine with others. What if I miss a dose? It is important not to miss your dose. Call your doctor or health care professional if you are unable to keep an appointment. What may interact with this medicine? Do not take this medicine with any of the following medications:  other medicines containing denosumab This medicine may also interact with the following medications:  medicines that lower your chance of  fighting infection  steroid medicines like prednisone or cortisone This list may not describe all possible interactions. Give your health care provider a list of all the medicines, herbs, non-prescription drugs, or dietary supplements you use. Also tell them if you smoke, drink alcohol, or use illegal drugs. Some items may interact with your medicine. What should I watch for while using this medicine? Visit your doctor or health care professional for regular checks on your progress. Your doctor or health care professional may order blood tests and other tests to see how you are doing. Call your doctor or health care professional for advice if you get a fever, chills or sore throat, or other symptoms of a cold or flu. Do not treat yourself. This drug may decrease your body's ability to fight infection. Try to avoid being around people who are sick. You should make sure you get enough calcium and vitamin D while you are taking this medicine, unless your doctor tells you not to. Discuss the foods you eat and the vitamins you take with your health care professional. See your dentist regularly. Brush and floss your teeth as directed. Before you have any dental work done, tell your dentist you are receiving this medicine. Do not become pregnant while taking this medicine or for 5 months after stopping it. Talk with your doctor or health care professional about your birth control options while taking this medicine. Women should inform their doctor if they wish to become pregnant or think they might be pregnant. There is a potential for serious side effects to an unborn child. Talk to your health care professional or pharmacist for more information. What side effects may I notice from receiving this medicine? Side effects that you should report to your doctor or health care professional as soon as possible:  allergic reactions like skin rash, itching or hives, swelling of the face, lips, or tongue  bone  pain  breathing problems  dizziness  jaw pain, especially after dental work  redness, blistering, peeling of the skin  signs and symptoms of infection like fever or chills; cough; sore throat; pain or trouble passing urine  signs of low calcium like fast heartbeat, muscle cramps or muscle pain; pain, tingling, numbness in the hands or feet; seizures  unusual bleeding or bruising  unusually weak or tired Side effects that usually do not require medical attention (report to your doctor or health care professional if they continue or are bothersome):  constipation  diarrhea  headache  joint pain  loss of appetite  muscle pain  runny nose  tiredness  upset stomach This list may not describe all possible side effects. Call your doctor for medical advice about side effects. You may report side effects to FDA at 1-800-FDA-1088. Where should I keep my medicine? This medicine is only given in a clinic, doctor's office, or other health care setting and will not be  stored at home. NOTE: This sheet is a summary. It may not cover all possible information. If you have questions about this medicine, talk to your doctor, pharmacist, or health care provider.  2020 Elsevier/Gold Standard (2017-12-17 16:10:44)

## 2020-06-21 ENCOUNTER — Telehealth: Payer: Self-pay | Admitting: Oncology

## 2020-06-21 NOTE — Telephone Encounter (Signed)
Scheduled appt per 10/28 sch msg - mailed reminder letter with appt date and time. [facility] radiology to contact pt with scan appt.

## 2020-07-01 ENCOUNTER — Other Ambulatory Visit: Payer: Self-pay | Admitting: Oncology

## 2020-07-01 DIAGNOSIS — C61 Malignant neoplasm of prostate: Secondary | ICD-10-CM

## 2020-07-04 MED FILL — ABIRATERONE ACETATE 250 MG: 250 | 30 days supply | Qty: 120 | Fill #0

## 2020-07-11 MED FILL — predniSONE 5 MG TABS: 5 | 30 days supply | Qty: 90 | Fill #2

## 2020-07-11 MED FILL — OYSTER SHELL CALCIUM-VIT D: 500-200 | 30 days supply | Qty: 60 | Fill #3

## 2020-07-16 ENCOUNTER — Telehealth (INDEPENDENT_AMBULATORY_CARE_PROVIDER_SITE_OTHER): Payer: BC Managed Care – PPO | Admitting: Family Medicine

## 2020-07-16 ENCOUNTER — Other Ambulatory Visit: Payer: Self-pay

## 2020-07-16 DIAGNOSIS — U071 COVID-19: Secondary | ICD-10-CM | POA: Diagnosis not present

## 2020-07-16 NOTE — Patient Instructions (Addendum)
   ---------------------------------------------------------------------------------------------------------------------------      WORK SLIP:  Patient David Huffman,  Nov 21, 1960, was seen for a medical visit today, 07/16/20 . Please excuse from work according to the [facility] guidelines for a COVID like illness. We advise 14 days minimum from the onset of symptoms (07/12/20) PLUS 1 day of no fever and improved symptoms. Advise following CDC guidelines.   Sincerely: E-signature: Dr. Colin Benton, DO [place] Ph: [phone]   ------------------------------------------------------------------------------------------------------------------------------     [place]:  -[facility]: [phone] (only call if your Covid test is positive and you are interested in monoclonal antibody treatment which is available to those with risk factors within 10 days of symptom onset)  -can use tylenol or aleve if needed for fevers, aches and pains per instructions  -can use nasal saline a few times per day if nasal congestion, sometime a short course of Afrin nasal spray for 3 days can help as well  -stay hydrated, drink plenty of fluids and eat small healthy meals - avoid dairy  -can take 1000 IU Vit D3 and Vit C lozenges per instructions  -follow up with your doctor in 2-3 days unless improving and feeling better  -stay home while sick, except to seek medical care, and if you have [place] please stay home for a full 10 days since the onset of symptoms PLUS one day of no fever and feeling better.  It was nice to meet you today, and I really hope you are feeling better soon. I help [place] out with telemedicine visits on Tuesdays and Thursdays and am available for visits on those days. If you have any concerns or questions following this visit please schedule a follow up visit with your Primary Care doctor or seek care at a local urgent care clinic to  avoid delays in care.    Seek in person care promptly if your symptoms worsen, new concerns arise or you are not improving with treatment. Call 911 and/or seek emergency care if you symptoms are severe or life threatening.

## 2020-07-16 NOTE — Progress Notes (Signed)
Virtual Visit via Telephone Note  I connected with David Huffman on 07/16/20 at 11:40 AM EST by telephone and verified that I am speaking with the correct person using two identifiers.   I discussed the limitations, risks, security and privacy concerns of performing an evaluation and management service by telephone and the availability of in person appointments. I also discussed with the patient that there may be a patient responsible charge related to this service. The patient expressed understanding and agreed to proceed.  Location patient: home, [place] Location provider: work or home office Participants present for the call: patient, provider, Wife Patient did not have a visit with me in the prior 7 days to address this/these issue(s).   History of Present Illness:  Acute telemedicine visit for nasal congestion: -Onset: started about 1 week ago -Symptoms include: sinus congestion, cough, body aches, headaches and sinus discomfort started about 1 week ago, subjective fever - but feels better -positive test for covid today -Denies: malaise, CP, SOB, inability to eat, drink, get out of bed -Has tried: eating and drinking well -Pertinent past medical history:stage 4 prostate cancer, on treatment  -Pertinent medication allergies: nkda -COVID-19 vaccine status: not vaccinated for covid19   Observations/Objective: Patient sounds cheerful and well on the phone. I do not appreciate any SOB. Speech and thought processing are grossly intact. Patient reported vitals:  Assessment and Plan:  COVID-19  -we discussed possible serious and likely etiologies, options for evaluation and workup, limitations of telemedicine visit vs in person visit, treatment, treatment risks and precautions. Pt prefers to treat via telemedicine empirically rather than in person at this moment.  Discussed treatment options, potential complications and precautions.  He is interested in Mab infusion.  Sent epic message to  infusion center and provided patient with contact information for the Center.  Discussed symptomatic care.  Discussed options for care if worsening or any severe or concerning symptoms.  Advised home isolation. Work/School slipped offered: provided in patient instructions   Scheduled follow up with PCP offered: Agrees to call PCP office to schedule follow-up. Advised to seek prompt in person care if worsening, new symptoms arise, or if is not improving with treatment. Advised of options for inperson care in case PCP office not available. Did let the patient know that I only do telemedicine shifts for [place] on Tuesdays and Thursdays and advised a follow up visit with PCP or at an [facility] if has further questions or concerns.   Follow Up Instructions:  I did not refer this patient for an OV with me in the next 24 hours for this/these issue(s).  I discussed the assessment and treatment plan with the patient. The patient was provided an opportunity to ask questions and all were answered. The patient agreed with the plan and demonstrated an understanding of the instructions.   I spent 21 minutes on this encounter.   Lucretia Kern, DO

## 2020-07-17 ENCOUNTER — Telehealth: Payer: Self-pay | Admitting: Nurse Practitioner

## 2020-07-17 ENCOUNTER — Other Ambulatory Visit: Payer: Self-pay | Admitting: Nurse Practitioner

## 2020-07-17 ENCOUNTER — Ambulatory Visit (HOSPITAL_COMMUNITY)
Admission: RE | Admit: 2020-07-17 | Discharge: 2020-07-17 | Disposition: A | Discharge status: Home / Self Care | Payer: BC Managed Care – PPO | Source: Ambulatory Visit | Attending: Pulmonary Disease | Admitting: Pulmonary Disease

## 2020-07-17 DIAGNOSIS — U071 COVID-19: Secondary | ICD-10-CM

## 2020-07-17 DIAGNOSIS — C61 Malignant neoplasm of prostate: Secondary | ICD-10-CM | POA: Diagnosis not present

## 2020-07-17 MED ORDER — DIPHENHYDRAMINE HCL 50 MG/ML IJ SOLN: 50.0000 mg | Freq: Once | INTRAMUSCULAR | Status: DC | PRN

## 2020-07-17 MED ORDER — ALBUTEROL SULFATE HFA 108 (90 BASE) MCG/ACT IN AERS: 2.0000 | INHALATION_SPRAY | Freq: Once | RESPIRATORY_TRACT | Status: DC | PRN

## 2020-07-17 MED ORDER — FAMOTIDINE IN NACL 20-0.9 MG/50ML-% IV SOLN: 20.0000 mg | Freq: Once | INTRAVENOUS | Status: DC | PRN

## 2020-07-17 MED ORDER — SODIUM CHLORIDE 0.9 % IV SOLN: INTRAVENOUS | Status: DC | PRN

## 2020-07-17 MED ORDER — SODIUM CHLORIDE 0.9 % IV BOLUS: 1000.0000 mL | Freq: Once | INTRAVENOUS | Status: DC

## 2020-07-17 MED ORDER — EPINEPHRINE 0.3 MG/0.3ML IJ SOAJ: 0.3000 mg | Freq: Once | INTRAMUSCULAR | Status: DC | PRN

## 2020-07-17 MED ORDER — METHYLPREDNISOLONE SODIUM SUCC 125 MG IJ SOLR: 125.0000 mg | Freq: Once | INTRAMUSCULAR | Status: DC | PRN

## 2020-07-17 MED ORDER — SOTROVIMAB 500 MG/8ML IV SOLN
500.0000 mg | Freq: Once | INTRAVENOUS | Status: AC
  Administered 2020-07-17: 500 mg via INTRAVENOUS

## 2020-07-17 NOTE — Progress Notes (Signed)
Patient reviewed Fact Sheet for Patients, Parents, and Caregivers for Emergency Use Authorization (EUA) of Sotrovimab for the Treatment of Coronavirus. Patient also reviewed and is agreeable to the estimated cost of treatment. Patient is agreeable to proceed.   

## 2020-07-17 NOTE — Progress Notes (Signed)
I connected by phone with David Huffman on 07/17/2020 at 1:18 PM to discuss the potential use of a new treatment for mild to moderate COVID-19 viral infection in non-hospitalized patients.  This patient is a 59 y.o. male that meets the FDA criteria for Emergency Use Authorization of COVID monoclonal antibody casirivimab/imdevimab, bamlanivimab/eteseviamb, or sotrovimab.  Has a (+) direct SARS-CoV-2 viral test result  Has mild or moderate COVID-19   Is NOT hospitalized due to COVID-19  Is within 10 days of symptom onset  Has at least one of the high risk factor(s) for progression to severe COVID-19 and/or hospitalization as defined in EUA.  Specific high risk criteria : Immunosuppressive Disease or Treatment   I have spoken and communicated the following to the patient or parent/caregiver regarding COVID monoclonal antibody treatment:  1. FDA has authorized the emergency use for the treatment of mild to moderate COVID-19 in adults and pediatric patients with positive results of direct SARS-CoV-2 viral testing who are 42 years of age and older weighing at least 40 kg, and who are at high risk for progressing to severe COVID-19 and/or hospitalization.  2. The significant known and potential risks and benefits of COVID monoclonal antibody, and the extent to which such potential risks and benefits are unknown.  3. Information on available alternative treatments and the risks and benefits of those alternatives, including clinical trials.  4. Patients treated with COVID monoclonal antibody should continue to self-isolate and use infection control measures (e.g., wear mask, isolate, social distance, avoid sharing personal items, clean and disinfect "high touch" surfaces, and frequent handwashing) according to CDC guidelines.   5. The patient or parent/caregiver has the option to accept or refuse COVID monoclonal antibody treatment.  After reviewing this information with the patient, the patient  has agreed to receive one of the available covid 19 monoclonal antibodies and will be provided an appropriate fact sheet prior to infusion. Jobe Gibbon, NP 07/17/2020 1:18 PM

## 2020-07-17 NOTE — Discharge Instructions (Signed)

## 2020-07-17 NOTE — Telephone Encounter (Signed)
Called to Discuss with patient about Covid symptoms and the use of the monoclonal antibody infusion for those with mild to moderate Covid symptoms and at a high risk of hospitalization.     Pt appears to qualify for this infusion due to co-morbid conditions and/or a member of an at-risk group in accordance with the FDA Emergency Use Authorization.    Patient and wife declining infusion. Reports they are in the process of being scheduled in [place]. Offered same day appointment, respectfully declined.   Alda Lea, NP [facility]  [phone]

## 2020-07-17 NOTE — Progress Notes (Signed)
Diagnosis: COVID-19  Physician: Dr. Patrick Wright  Procedure: Covid Infusion Clinic Med: Sotrovimab infusion - Provided patient with sotrovimab fact sheet for patients, parents, and caregivers prior to infusion.   Complications: No immediate complications noted  Discharge: Discharged home  If after the infusion you have any questions or concerns please call the Advanced Practice Provider at [phone] 

## 2020-07-22 ENCOUNTER — Telehealth: Payer: Self-pay

## 2020-07-22 NOTE — Telephone Encounter (Signed)
Chief Complaint BREATHING - shortness of breath or sounds breathless Reason for Call Symptomatic / Request for [place] states husband tested positive for Covid on Tuesday and got the antibodies on Wed. He has shortness of breath. Translation No Nurse Assessment Nurse: Tawni Pummel, RN, Greg Date/Time Eilene Ghazi Time): 07/19/2020 5:23:23 AM Confirm and document reason for call. If symptomatic, describe symptoms. ---Caller states husband tested positive for Covid on Tuesday and got the antibodies on Wed. He has shortness of breath. Does the patient have any new or worsening symptoms? ---Yes Will a triage be completed? ---Yes Related visit to physician within the last 2 weeks? ---Yes Does the PT have any chronic conditions? (i.e. diabetes, asthma, this includes High risk factors for pregnancy, etc.) ---Yes List chronic conditions. ---prostate cancer Is this a behavioral health or substance abuse call? ---No Guidelines Guideline Title Affirmed Question Affirmed Notes Nurse Date/Time ([place] Time) COVID-19 - Diagnosed or Suspected [1] COVID-19 diagnosed by doctor (or NP/PA) AND [2] mild symptoms (e.g., cough, fever, others) AND [3] no complications or SOB Tawni Pummel, RN, Marya Amsler 07/19/2020 5:30:37 AM Disp. Time Eilene Ghazi Time) Disposition Final User 07/19/2020 5:14:58 AM Send to Urgent Kathreen Cornfield, [place] NOTE: All timestamps contained within this report are represented as [place] Standard Time. CONFIDENTIALTY NOTICE: This fax transmission is intended only for the addressee. It contains information that is legally privileged, confidential or otherwise protected from use or disclosure. If you are not the intended recipient, you are strictly prohibited from reviewing, disclosing, copying using or disseminating any of this information or taking any action in reliance on or regarding this information. If you have received this fax in error, please  notify us immediately by telephone so that we can arrange for its return to [place]. Phone: [phone], Toll-Free: [phone], Fax: [phone] Page: 2 of 2 Call Id: [id number] 07/19/2020 5:36:42 AM Home Care Yes Tawni Pummel, RN, Joneen Roach Disagree/Comply Comply Caller Understands Yes PreDisposition Did not know what to do Care Advice Given Per Guideline HOME CARE: CALL BACK IF: * Fever over 103 F (39.4 C) * Fever lasts over 3 days * Fever returns after being gone for 24 hours * Chest pain or difficulty breathing occurs * You become worse CARE ADVICE given per COVID-19 - DIAGNOSED OR SUSPECTED (Adult) guideline

## 2020-08-06 ENCOUNTER — Other Ambulatory Visit: Payer: Self-pay | Admitting: Oncology

## 2020-08-06 DIAGNOSIS — C61 Malignant neoplasm of prostate: Secondary | ICD-10-CM

## 2020-08-07 MED FILL — ABIRATERONE ACETATE 250 MG: 250 | 30 days supply | Qty: 120 | Fill #0

## 2020-08-28 ENCOUNTER — Other Ambulatory Visit: Payer: Self-pay | Admitting: Oncology

## 2020-08-28 DIAGNOSIS — C61 Malignant neoplasm of prostate: Secondary | ICD-10-CM

## 2020-08-28 MED FILL — predniSONE 5 MG TABS: 5 | 30 days supply | Qty: 90 | Fill #3

## 2020-08-30 ENCOUNTER — Other Ambulatory Visit: Payer: Self-pay | Admitting: Oncology

## 2020-08-30 MED FILL — OYSTER SHELL CALCIUM-VIT D: 500-200 | 30 days supply | Qty: 60 | Fill #0

## 2020-08-30 MED FILL — ABIRATERONE ACETATE 250 MG: 250 | 30 days supply | Qty: 120 | Fill #0

## 2020-09-03 ENCOUNTER — Telehealth: Payer: Self-pay | Admitting: Genetic Counselor

## 2020-09-03 NOTE — Telephone Encounter (Signed)
Left message for patient to verify visit for pre reg °

## 2020-09-04 ENCOUNTER — Other Ambulatory Visit: Payer: Self-pay | Admitting: Genetic Counselor

## 2020-09-04 ENCOUNTER — Ambulatory Visit (HOSPITAL_BASED_OUTPATIENT_CLINIC_OR_DEPARTMENT_OTHER): Payer: BC Managed Care – PPO | Admitting: Genetic Counselor

## 2020-09-04 ENCOUNTER — Encounter: Payer: Self-pay | Admitting: Genetic Counselor

## 2020-09-04 DIAGNOSIS — C61 Malignant neoplasm of prostate: Secondary | ICD-10-CM

## 2020-09-04 DIAGNOSIS — Z803 Family history of malignant neoplasm of breast: Secondary | ICD-10-CM

## 2020-09-04 DIAGNOSIS — Z8042 Family history of malignant neoplasm of prostate: Secondary | ICD-10-CM | POA: Diagnosis not present

## 2020-09-04 NOTE — Progress Notes (Signed)
Genetic testing

## 2020-09-04 NOTE — Progress Notes (Signed)
REFERRING PROVIDER: Wyatt Portela, MD [address] [place],  [place] [address]  PRIMARY PROVIDER:  Orma Flaming, MD  PRIMARY REASON FOR VISIT:  1. Malignant neoplasm of prostate ([place])   2. Family history of prostate cancer   3. Family history of breast cancer      HISTORY OF PRESENT ILLNESS:  I connected with  David Huffman on 09/04/2020 at 9 AM EDT by MyChart video conference and verified that I am speaking with the correct person using two identifiers.   Patient location: Home Provider location: [facility]  David Huffman, a 60 y.o. male, was seen for a [facility] cancer genetics consultation at the request of Dr. Alen Blew due to a personal and family history of prostate cancer.  David Huffman presents to clinic today to discuss the possibility of a hereditary predisposition to cancer, genetic testing, and to further clarify his future cancer risks, as well as potential cancer risks for family members.   In 2008, at the age of 53, David Huffman was diagnosed with prostate cancer.  It affected both lobes of the prostate, and the Gleason score was 7. IT has not spread to the bone of his pelvis.    CANCER HISTORY:  Oncology History   No history exists.     Past Medical History:  Diagnosis Date  . Family history of breast cancer   . Family history of prostate cancer   . Prostate cancer ([place]) 08/2006    Past Surgical History:  Procedure Laterality Date  . PROSTATE BIOPSY    . PROSTATECTOMY    . VASECTOMY      Social History   Socioeconomic History  . Marital status: Married    Spouse name: Not on file  . Number of children: Not on file  . Years of education: Not on file  . Highest education level: Not on file  Occupational History  . Not on file  Tobacco Use  . Smoking status: Never Smoker  . Smokeless tobacco: Never Used  Vaping Use  . Vaping Use: Never used  Substance and Sexual Activity  . Alcohol use: Yes    Comment: social  . Drug use: Never  . Sexual  activity: Not Currently  Other Topics Concern  . Not on file  Social History Narrative   [place] to [place]. Works for [employer]. Active runs and rides mountain bikes. Two children: 1 boy, 1 girl.    Social Determinants of Health   Financial Resource Strain: Not on file  Food Insecurity: Not on file  Transportation Needs: Not on file  Physical Activity: Not on file  Stress: Not on file  Social Connections: Not on file     FAMILY HISTORY:  We obtained a detailed, 4-generation family history.  Significant diagnoses are listed below: Family History  Problem Relation Age of Onset  . Cancer Father 31       prostate  . Leukemia Father        CLL  . Brain cancer Father   . Cancer Paternal Grandfather        prostate  . Cancer Paternal Uncle        prostate  . Lymphoma Mother   . Diabetes Maternal Aunt   . Clotting disorder Maternal Grandmother   . Other Maternal Grandfather        heart problem due to rheumatic fever  . Breast cancer Paternal Grandmother 30  . Cancer Paternal Uncle  tumor on his spine    The patient has a son and daughter who are cancer free.  He has one sister who is cancer free.  Both parents are deceased.  The patient's mother had lymphoma at 87 and died from 38. She had two sisters who reportedly are cancer free.  His maternal grandparents are deceased.  The patient's father had prostate cancer at 29, and eventually had leukemia and brain cancer.  He had three brothers.  One brother had a tumor on his spine, and another had prostate cancer.  The paternal grandparents are deceased. The grandmother had breast cancer and the grandfather had prostate cancer.  David Huffman is unaware of previous family history of genetic testing for hereditary cancer risks. Patient's maternal ancestors are of [place] descent, and paternal ancestors are of [place] descent. There is no reported Ashkenazi Jewish ancestry. There is no known consanguinity.    GENETIC  COUNSELING ASSESSMENT: David Huffman is a 60 y.o. male with a personal and family history of cancer which is somewhat suggestive of a hereditary cancer syndrome and predisposition to cancer given the combination of cancer in the family and his young age of onset. We, therefore, discussed and recommended the following at today's visit.   DISCUSSION: We discussed that up to 15% of prostate cancer is hereditary, with most cases associated with BRCA mutations.  There are other genes that can be associated with hereditary prostate cancer syndromes.  These include HOXB13, PALB2, CHEK2 and others.  We discussed that testing is beneficial for several reasons including knowing how to follow individuals after completing their treatment, identifying whether potential treatment options such as PARP inhibitors would be beneficial, and understand if other family members could be at risk for cancer and allow them to undergo genetic testing.   We reviewed the characteristics, features and inheritance patterns of hereditary cancer syndromes. We also discussed genetic testing, including the appropriate family members to test, the process of testing, insurance coverage and turn-around-time for results. We discussed the implications of a negative, positive, carrier and/or variant of uncertain significant result. We recommended David Huffman pursue genetic testing for the CancerNext-Expanded+RNAinsight gene panel. The CancerNext-Expanded gene panel offered by [facility] and includes sequencing and rearrangement analysis for the following 77 genes: AIP, ALK, APC*, ATM*, AXIN2, BAP1, BARD1, BLM, BMPR1A, BRCA1*, BRCA2*, BRIP1*, CDC73, CDH1*, CDK4, CDKN1B, CDKN2A, CHEK2*, CTNNA1, DICER1, FANCC, FH, FLCN, GALNT12, KIF1B, LZTR1, MAX, MEN1, MET, MLH1*, MSH2*, MSH3, MSH6*, MUTYH*, NBN, NF1*, NF2, NTHL1, PALB2*, PHOX2B, PMS2*, POT1, PRKAR1A, PTCH1, PTEN*, RAD51C*, RAD51D*, RB1, RECQL, RET, SDHA, SDHAF2, SDHB, SDHC, SDHD, SMAD4, SMARCA4, SMARCB1,  SMARCE1, STK11, SUFU, TMEM127, TP53*, TSC1, TSC2, VHL and XRCC2 (sequencing and deletion/duplication); EGFR, EGLN1, HOXB13, KIT, MITF, PDGFRA, POLD1, and POLE (sequencing only); EPCAM and GREM1 (deletion/duplication only). DNA and RNA analyses performed for * genes.   Based on David Huffman personal and family history of cancer, he meets medical criteria for genetic testing. Despite that he meets criteria, he may still have an out of pocket cost. We discussed that if his out of pocket cost for testing is over $100, the laboratory will call and confirm whether he wants to proceed with testing.  If the out of pocket cost of testing is less than $100 he will be billed by the genetic testing laboratory.   PLAN: After considering the risks, benefits, and limitations, David Huffman provided informed consent to pursue genetic testing and the blood sample was sent to [facility] for analysis of the CancerNext-Expanded+RNAinsight.  Results should be available within approximately 2-3 weeks' time, at which point they will be disclosed by telephone to David Huffman, as will any additional recommendations warranted by these results. David Huffman will receive a summary of his genetic counseling visit and a copy of his results once available. This information will also be available in Epic.   Lastly, we encouraged Mr. Gan to remain in contact with cancer genetics annually so that we can continuously update the family history and inform him of any changes in cancer genetics and testing that may be of benefit for this family.   David Huffman questions were answered to his satisfaction today. Our contact information was provided should additional questions or concerns arise. Thank you for the referral and allowing [place] to share in the care of your patient.   Pranit Owensby P. Florene Glen, [place], [facility] Licensed, Insurance risk surveyor [email or web address] phone: [phone]  The patient was seen for a total of 35 minutes in  face-to-face genetic counseling.  This patient was discussed with Drs. Magrinat, Lindi Adie and/or Burr Medico who agrees with the above.    _______________________________________________________________________ For Office Staff:  Number of people involved in session: 2 Was an Intern/ student involved with case: yes

## 2020-09-20 ENCOUNTER — Other Ambulatory Visit: Payer: Self-pay

## 2020-09-20 ENCOUNTER — Encounter (HOSPITAL_COMMUNITY)
Admission: RE | Admit: 2020-09-20 | Discharge: 2020-09-20 | Disposition: A | Discharge status: Home / Self Care | Payer: BC Managed Care – PPO | Source: Ambulatory Visit | Attending: Oncology | Admitting: Oncology

## 2020-09-20 ENCOUNTER — Ambulatory Visit (HOSPITAL_COMMUNITY)
Admission: RE | Admit: 2020-09-20 | Discharge: 2020-09-20 | Disposition: A | Discharge status: Home / Self Care | Payer: BC Managed Care – PPO | Source: Ambulatory Visit | Attending: Oncology | Admitting: Oncology

## 2020-09-20 ENCOUNTER — Inpatient Hospital Stay: Payer: BC Managed Care – PPO | Attending: Oncology

## 2020-09-20 DIAGNOSIS — C61 Malignant neoplasm of prostate: Secondary | ICD-10-CM

## 2020-09-20 LAB — CBC WITH DIFFERENTIAL (CANCER CENTER ONLY)
Abs Immature Granulocytes: 0.01 10*3/uL (ref 0.00–0.07)
Basophils Absolute: 0 10*3/uL (ref 0.0–0.1)
Basophils Relative: 1 %
Eosinophils Absolute: 0.1 10*3/uL (ref 0.0–0.5)
Eosinophils Relative: 3 %
HCT: 42.1 % (ref 39.0–52.0)
Hemoglobin: 14.2 g/dL (ref 13.0–17.0)
Immature Granulocytes: 0 %
Lymphocytes Relative: 41 %
Lymphs Abs: 1.8 10*3/uL (ref 0.7–4.0)
MCH: 29.8 pg (ref 26.0–34.0)
MCHC: 33.7 g/dL (ref 30.0–36.0)
MCV: 88.3 fL (ref 80.0–100.0)
Monocytes Absolute: 0.4 10*3/uL (ref 0.1–1.0)
Monocytes Relative: 9 %
Neutro Abs: 2.1 10*3/uL (ref 1.7–7.7)
Neutrophils Relative %: 46 %
Platelet Count: 230 10*3/uL (ref 150–400)
RBC: 4.77 MIL/uL (ref 4.22–5.81)
RDW: 12.5 % (ref 11.5–15.5)
WBC Count: 4.4 10*3/uL (ref 4.0–10.5)
nRBC: 0 % (ref 0.0–0.2)

## 2020-09-20 LAB — CMP (CANCER CENTER ONLY)
ALT: 19 U/L (ref 0–44)
AST: 21 U/L (ref 15–41)
Albumin: 4 g/dL (ref 3.5–5.0)
Alkaline Phosphatase: 60 U/L (ref 38–126)
Anion gap: 6 (ref 5–15)
BUN: 19 mg/dL (ref 6–20)
CO2: 27 mmol/L (ref 22–32)
Calcium: 9.2 mg/dL (ref 8.9–10.3)
Chloride: 107 mmol/L (ref 98–111)
Creatinine: 1.02 mg/dL (ref 0.61–1.24)
GFR, Estimated: >60 mL/min (ref >60)
Glucose, Bld: 94 mg/dL (ref 70–99)
Potassium: 4.1 mmol/L (ref 3.5–5.1)
Sodium: 140 mmol/L (ref 135–145)
Total Bilirubin: 1.9 mg/dL — ABNORMAL HIGH (ref 0.3–1.2)
Total Protein: 7 g/dL (ref 6.5–8.1)

## 2020-09-20 LAB — GENETIC SCREENING ORDER

## 2020-09-20 MED ORDER — IOHEXOL 300 MG/ML  SOLN
100.0000 mL | Freq: Once | INTRAMUSCULAR | Status: AC | PRN
  Administered 2020-09-20: 100 mL via INTRAVENOUS

## 2020-09-20 MED ORDER — TECHNETIUM TC 99M MEDRONATE IV KIT
21.1000 | PACK | Freq: Once | INTRAVENOUS | Status: AC | PRN
  Administered 2020-09-20: 21.1 via INTRAVENOUS

## 2020-09-21 LAB — PROSTATE-SPECIFIC AG, SERUM (LABCORP): Prostate Specific Ag, Serum: <0.1 ng/mL (ref 0.0–4.0)

## 2020-09-27 ENCOUNTER — Inpatient Hospital Stay: Payer: BC Managed Care – PPO

## 2020-09-27 ENCOUNTER — Inpatient Hospital Stay: Payer: BC Managed Care – PPO | Attending: Oncology | Admitting: Oncology

## 2020-09-27 ENCOUNTER — Other Ambulatory Visit: Payer: Self-pay

## 2020-09-27 VITALS — BP 109/76 | HR 78 | Temp 96.3°F | Resp 18 | Ht 68.0 in | Wt 159.2 lb

## 2020-09-27 VITALS — BP 112/76 | HR 63 | Resp 18

## 2020-09-27 DIAGNOSIS — C61 Malignant neoplasm of prostate: Secondary | ICD-10-CM | POA: Diagnosis not present

## 2020-09-27 DIAGNOSIS — Z5111 Encounter for antineoplastic chemotherapy: Secondary | ICD-10-CM | POA: Insufficient documentation

## 2020-09-27 DIAGNOSIS — Z79899 Other long term (current) drug therapy: Secondary | ICD-10-CM | POA: Insufficient documentation

## 2020-09-27 DIAGNOSIS — C7951 Secondary malignant neoplasm of bone: Secondary | ICD-10-CM | POA: Diagnosis present

## 2020-09-27 MED ORDER — LEUPROLIDE ACETATE (3 MONTH) 22.5 MG ~~LOC~~ KIT
22.5000 mg | PACK | Freq: Once | SUBCUTANEOUS | Status: AC
  Administered 2020-09-27: 22.5 mg via SUBCUTANEOUS

## 2020-09-27 MED ORDER — DENOSUMAB 120 MG/1.7ML ~~LOC~~ SOLN
SUBCUTANEOUS | Status: AC
  Filled 2020-09-27: qty 1.7

## 2020-09-27 MED ORDER — DENOSUMAB 120 MG/1.7ML ~~LOC~~ SOLN
120.0000 mg | Freq: Once | SUBCUTANEOUS | Status: AC
  Administered 2020-09-27: 120 mg via SUBCUTANEOUS

## 2020-09-27 MED ORDER — LEUPROLIDE ACETATE (3 MONTH) 22.5 MG ~~LOC~~ KIT
PACK | SUBCUTANEOUS | Status: AC
  Filled 2020-09-27: qty 22.5

## 2020-09-27 NOTE — Progress Notes (Signed)
Hematology and Oncology Follow Up Visit  David Huffman [id number] Aug 27, 1960 60 y.o. 09/27/2020 8:32 AM  CC: David Messick, MD    Principle Diagnosis: 60 year old man with advanced prostate cancer with disease to the bone diagnosed in 2018.  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   Prior Therapy: 1. Status post prostatectomy done in 2008, pathology revealing T2c N0 with 0 out of 12 lymph nodes involved.  2. The patient continued to have rise in his PSA up to 52 after prostatectomy without any measurable disease.  Lupron was started at that time. 3.     Casodex 50 mg daily added in 05/2011. Therapy discontinued in March 2018 because of progression of disease. 4.     Status post radiation therapy to the pelvic bone completed in June 2019 he received 50 Gy in 10 fractions.   Current therapy:  Eligard 22.5 mg every 3 months.  He will receive injection today and repeated in 3 months.  Zytiga 1000 mg daily with prednisone 5 mg daily since May 2018.  Xgeva 120 mg every 3 months started on 06/20/2019.  This will be given today as well.   Interim History:  David Huffman presents today for a repeat follow-up.  Since the last visit, he reports no major changes in his health.  He has recovered from COVID-19 infection around November 2021.  He has reported that decline in his appetite and weight loss because of it but has regained most activities of daily living including work and exercise regularly.  He denies any complications related to Zytiga.  Denies any nausea, vomiting or edema.  Denies any bone pain or pathological fractures.        Medications: Updated on review. Current Outpatient Medications  Medication Sig Dispense Refill  . abiraterone acetate (ZYTIGA) 250 MG tablet TAKE 4 TABLETS (1000MG ) BY MOUTH DAILY ON AN EMPTY STOMACH 1 HOUR BEFORE OR 2 HOURS AFTER A MEAL 120 tablet 2  . Ascorbic Acid (VITAMIN C) 1000 MG tablet Take  1,000 mg by mouth daily.    Marland Kitchen b complex vitamins tablet Take 1 tablet by mouth daily.    . Calcium Carb-Cholecalciferol (OYSTER SHELL CALCIUM W/D) 500-200 MG-UNIT TABS Take 1 tablet by mouth 2 (two) times daily.    . Calcium Carb-Cholecalciferol (OYSTER SHELL CALCIUM W/D) 500-200 MG-UNIT TABS TAKE 1 TABLET BY MOUTH TWO TIMES DAILY 60 tablet 3  . denosumab (XGEVA) 120 MG/1.7ML SOLN injection Inject 120 mg into the skin once.    Marland Kitchen leuprolide (LUPRON) 22.5 MG injection Inject 22.5 mg into the muscle every 3 (three) months.    . Melatonin 10 MG CAPS Take 12 mg 2 (two) times daily at 8 am and 10 pm by mouth. (Patient not taking: Reported on 06/03/2020)    . Multiple Vitamins-Minerals (MULTIVITAMIN PO) Take 1 tablet by mouth daily. (Patient not taking: Reported on 06/03/2020)    . Omega-3 Fatty Acids (FISH OIL) 1000 MG CAPS Take 1,000 mg by mouth daily. (Patient not taking: Reported on 06/03/2020)    . predniSONE (DELTASONE) 5 MG tablet TAKE 1 TABLET (5 MG TOTAL) BY MOUTH DAILY WITH BREAKFAST. 90 tablet 3  . VITAMIN D, ERGOCALCIFEROL, PO Take 1 tablet by mouth daily.     No current facility-administered medications for this visit.    Allergies: No Known Allergies    Physical Exam:        Blood pressure 109/76, pulse 78, temperature (!)  96.3 F (35.7 C), temperature source Tympanic, resp. rate 18, height 5\' 8"  (1.727 m), weight 159 lb 3.2 oz (72.2 kg), SpO2 100 %.     ECOG: 0    General appearance: Alert, awake without any distress. Head: Atraumatic without abnormalities Oropharynx: Without any thrush or ulcers. Eyes: No scleral icterus. Lymph nodes: No lymphadenopathy noted in the cervical, supraclavicular, or axillary nodes Heart:regular rate and rhythm, without any murmurs or gallops.   Lung: Clear to auscultation without any rhonchi, wheezes or dullness to percussion. Abdomin: Soft, nontender without any shifting dullness or ascites. Musculoskeletal: No clubbing or  cyanosis. Neurological: No motor or sensory deficits. Skin: No rashes or lesions.              Lab Results: Lab Results  Component Value Date   WBC 4.4 09/20/2020   HGB 14.2 09/20/2020   HCT 42.1 09/20/2020   MCV 88.3 09/20/2020   PLT 230 09/20/2020     Chemistry      Component Value Date/Time   NA 140 09/20/2020 0829   NA 142 08/18/2017 0754   K 4.1 09/20/2020 0829   K 4.2 08/18/2017 0754   CL 107 09/20/2020 0829   CL 106 01/03/2013 0916   CO2 27 09/20/2020 0829   CO2 25 08/18/2017 0754   BUN 19 09/20/2020 0829   BUN 14.0 08/18/2017 0754   CREATININE 1.02 09/20/2020 0829   CREATININE 1.00 06/03/2020 1047   CREATININE 1.0 08/18/2017 0754      Component Value Date/Time   CALCIUM 9.2 09/20/2020 0829   CALCIUM 9.5 08/18/2017 0754   ALKPHOS 60 09/20/2020 0829   ALKPHOS 106 08/18/2017 0754   AST 21 09/20/2020 0829   AST 31 08/18/2017 0754   ALT 19 09/20/2020 0829   ALT 39 08/18/2017 0754   BILITOT 1.9 (H) 09/20/2020 0829   BILITOT 1.26 (H) 08/18/2017 0754      Results for David Huffman (MRN [id number]) as of 09/27/2020 08:34  Ref. Range 09/20/2020 08:29  Prostate Specific Ag, Serum Latest Ref Range: 0.0 - 4.0 ng/mL <0.1    IMPRESSION: 1. Again seen are sclerotic osseous lesions of the right pubic symphysis and right inferior pubic ramus. No CT evidence of new osseous metastatic disease. 2. No evidence of new metastatic disease within the chest, abdomen, or pelvis. 3. Stable changes of prior prostatectomy.   IMPRESSION: 1. Several new lesion the posterior LEFT ribs corresponding healed fractures on comparison CT. Recommend correlation for history of trauma. 2. New activity in the LEFT fourth rib medially. Unusual location for fracture. There is sclerotic lesion at this level. Findings concerning for prostate cancer. 3. Resolution of a metastatic activity within the posterior RIGHT rib. 4. Stable RIGHT pubic ramus lesion.    ASSESSMENT AND PLAN:    60 year old man with:  1.  Advanced prostate cancer with disease to the bone diagnosed in 2018.  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      The natural course of this disease was discussed today and his disease status was updated.  His PSA remains undetectable and imaging studies does not show any clear-cut progression.  He has left fourth rib no activity which could be traumatic in nature.  Risks and benefits of continuing Zytiga at this time.  Long-term complication clinic hypertension, hypokalemia and elevation in his liver function tests were discussed.  Alternative options such as systemic chemotherapy was reviewed.  He is agreeable to continue at this time.  The bone scan abnormality in his  left fourth rib will be monitored future likely traumatic despite the appearance and given his low PSA.   2.  Right hip metastatic lesion: No progression of disease noted on imaging studies.  Status post radiation therapy.  3. Androgen depravation: Long-term complications including weight gain, hot flashes and sexual dysfunction were discussed.  He is agreeable to continue and will receive this today and repeated in 3 months.  4. Bone health: Long-term complication occluding osteonecrosis of the jaw and hypocalcemia associated with Delton See were reviewed.  Is agreeable to proceed we will get it every 3 months.   5. Follow-up: In 3 months for repeat follow-up.  30 minutes were dedicated to this encounter.  The time was spent on reviewing laboratory data, imaging studies and future plan of care discussion.  Zola Button, MD 2/4/20228:32 AM

## 2020-09-27 NOTE — Patient Instructions (Signed)
Denosumab injection What is this medicine? DENOSUMAB (den oh sue mab) slows bone breakdown. Prolia is used to treat osteoporosis in women after menopause and in men, and in people who are taking corticosteroids for 6 months or more. Xgeva is used to treat a high calcium level due to cancer and to prevent bone fractures and other bone problems caused by multiple myeloma or cancer bone metastases. Xgeva is also used to treat giant cell tumor of the bone. This medicine may be used for other purposes; ask your health care provider or pharmacist if you have questions. COMMON BRAND NAME(S): Prolia, XGEVA What should I tell my health care provider before I take this medicine? They need to know if you have any of these conditions:  dental disease  having surgery or tooth extraction  infection  kidney disease  low levels of calcium or Vitamin D in the blood  malnutrition  on hemodialysis  skin conditions or sensitivity  thyroid or parathyroid disease  an unusual reaction to denosumab, other medicines, foods, dyes, or preservatives  pregnant or trying to get pregnant  breast-feeding How should I use this medicine? This medicine is for injection under the skin. It is given by a health care professional in a hospital or clinic setting. A special MedGuide will be given to you before each treatment. Be sure to read this information carefully each time. For Prolia, talk to your pediatrician regarding the use of this medicine in children. Special care may be needed. For Xgeva, talk to your pediatrician regarding the use of this medicine in children. While this drug may be prescribed for children as young as 13 years for selected conditions, precautions do apply. Overdosage: If you think you have taken too much of this medicine contact a poison control center or emergency room at once. NOTE: This medicine is only for you. Do not share this medicine with others. What if I miss a dose? It is  important not to miss your dose. Call your doctor or health care professional if you are unable to keep an appointment. What may interact with this medicine? Do not take this medicine with any of the following medications:  other medicines containing denosumab This medicine may also interact with the following medications:  medicines that lower your chance of fighting infection  steroid medicines like prednisone or cortisone This list may not describe all possible interactions. Give your health care provider a list of all the medicines, herbs, non-prescription drugs, or dietary supplements you use. Also tell them if you smoke, drink alcohol, or use illegal drugs. Some items may interact with your medicine. What should I watch for while using this medicine? Visit your doctor or health care professional for regular checks on your progress. Your doctor or health care professional may order blood tests and other tests to see how you are doing. Call your doctor or health care professional for advice if you get a fever, chills or sore throat, or other symptoms of a cold or flu. Do not treat yourself. This drug may decrease your body's ability to fight infection. Try to avoid being around people who are sick. You should make sure you get enough calcium and vitamin D while you are taking this medicine, unless your doctor tells you not to. Discuss the foods you eat and the vitamins you take with your health care professional. See your dentist regularly. Brush and floss your teeth as directed. Before you have any dental work done, tell your dentist you are   receiving this medicine. Do not become pregnant while taking this medicine or for 5 months after stopping it. Talk with your doctor or health care professional about your birth control options while taking this medicine. Women should inform their doctor if they wish to become pregnant or think they might be pregnant. There is a potential for serious side  effects to an unborn child. Talk to your health care professional or pharmacist for more information. What side effects may I notice from receiving this medicine? Side effects that you should report to your doctor or health care professional as soon as possible:  allergic reactions like skin rash, itching or hives, swelling of the face, lips, or tongue  bone pain  breathing problems  dizziness  jaw pain, especially after dental work  redness, blistering, peeling of the skin  signs and symptoms of infection like fever or chills; cough; sore throat; pain or trouble passing urine  signs of low calcium like fast heartbeat, muscle cramps or muscle pain; pain, tingling, numbness in the hands or feet; seizures  unusual bleeding or bruising  unusually weak or tired Side effects that usually do not require medical attention (report to your doctor or health care professional if they continue or are bothersome):  constipation  diarrhea  headache  joint pain  loss of appetite  muscle pain  runny nose  tiredness  upset stomach This list may not describe all possible side effects. Call your doctor for medical advice about side effects. You may report side effects to FDA at 1-800-FDA-1088. Where should I keep my medicine? This medicine is only given in a clinic, doctor's office, or other health care setting and will not be stored at home. NOTE: This sheet is a summary. It may not cover all possible information. If you have questions about this medicine, talk to your doctor, pharmacist, or health care provider.  2021 Elsevier/Gold Standard (2017-12-17 16:10:44) Leuprolide depot injection What is this medicine? LEUPROLIDE (loo PROE lide) is a man-made protein that acts like a natural hormone in the body. It decreases testosterone in men and decreases estrogen in women. In men, this medicine is used to treat advanced prostate cancer. In women, some forms of this medicine may be used  to treat endometriosis, uterine fibroids, or other male hormone-related problems. This medicine may be used for other purposes; ask your health care provider or pharmacist if you have questions. COMMON BRAND NAME(S): Eligard, Fensolv, Lupron Depot, Lupron Depot-Ped, Viadur What should I tell my health care provider before I take this medicine? They need to know if you have any of these conditions:  diabetes  heart disease or previous heart attack  high blood pressure  high cholesterol  mental illness  osteoporosis  pain or difficulty passing urine  seizures  spinal cord metastasis  stroke  suicidal thoughts, plans, or attempt; a previous suicide attempt by you or a family member  tobacco smoker  unusual vaginal bleeding (women)  an unusual or allergic reaction to leuprolide, benzyl alcohol, other medicines, foods, dyes, or preservatives  pregnant or trying to get pregnant  breast-feeding How should I use this medicine? This medicine is for injection into a muscle or for injection under the skin. It is given by a health care professional in a hospital or clinic setting. The specific product will determine how it will be given to you. Make sure you understand which product you receive and how often you will receive it. Talk to your pediatrician regarding the use of   this medicine in children. Special care may be needed. Overdosage: If you think you have taken too much of this medicine contact a poison control center or emergency room at once. NOTE: This medicine is only for you. Do not share this medicine with others. What if I miss a dose? It is important not to miss a dose. Call your doctor or health care professional if you are unable to keep an appointment. Depot injections: Depot injections are given either once-monthly, every 12 weeks, every 16 weeks, or every 24 weeks depending on the product you are prescribed. The product you are prescribed will be based on if you  are male or male, and your condition. Make sure you understand your product and dosing. What may interact with this medicine? Do not take this medicine with any of the following medications:  chasteberry  cisapride  dronedarone  pimozide  thioridazine This medicine may also interact with the following medications:  herbal or dietary supplements, like black cohosh or DHEA  male hormones, like estrogens or progestins and birth control pills, patches, rings, or injections  male hormones, like testosterone  other medicines that prolong the QT interval (abnormal heart rhythm) This list may not describe all possible interactions. Give your health care provider a list of all the medicines, herbs, non-prescription drugs, or dietary supplements you use. Also tell them if you smoke, drink alcohol, or use illegal drugs. Some items may interact with your medicine. What should I watch for while using this medicine? Visit your doctor or health care professional for regular checks on your progress. During the first weeks of treatment, your symptoms may get worse, but then will improve as you continue your treatment. You may get hot flashes, increased bone pain, increased difficulty passing urine, or an aggravation of nerve symptoms. Discuss these effects with your doctor or health care professional, some of them may improve with continued use of this medicine. Male patients may experience a menstrual cycle or spotting during the first months of therapy with this medicine. If this continues, contact your doctor or health care professional. This medicine may increase blood sugar. Ask your healthcare provider if changes in diet or medicines are needed if you have diabetes. What side effects may I notice from receiving this medicine? Side effects that you should report to your doctor or health care professional as soon as possible:  allergic reactions like skin rash, itching or hives, swelling of  the face, lips, or tongue  breathing problems  chest pain  depression or memory disorders  pain in your legs or groin  pain at site where injected or implanted  seizures  severe headache  signs and symptoms of high blood sugar such as being more thirsty or hungry or having to urinate more than normal. You may also feel very tired or have blurry vision  swelling of the feet and legs  suicidal thoughts or other mood changes  visual changes  vomiting Side effects that usually do not require medical attention (report to your doctor or health care professional if they continue or are bothersome):  breast swelling or tenderness  decrease in sex drive or performance  diarrhea  hot flashes  loss of appetite  muscle, joint, or bone pains  nausea  redness or irritation at site where injected or implanted  skin problems or acne This list may not describe all possible side effects. Call your doctor for medical advice about side effects. You may report side effects to FDA at 1-800-FDA-1088.  Where should I keep my medicine? This drug is given in a hospital or clinic and will not be stored at home. NOTE: This sheet is a summary. It may not cover all possible information. If you have questions about this medicine, talk to your doctor, pharmacist, or health care provider.  2021 Elsevier/Gold Standard (2019-07-12 10:35:13)

## 2020-10-02 MED FILL — OYSTER SHELL CALCIUM-VIT D: 500-200 | 30 days supply | Qty: 60 | Fill #1

## 2020-10-02 MED FILL — ABIRATERONE ACETATE 250 MG: 250 | 30 days supply | Qty: 120 | Fill #1

## 2020-10-08 ENCOUNTER — Encounter: Payer: Self-pay | Admitting: Genetic Counselor

## 2020-10-08 ENCOUNTER — Telehealth: Payer: Self-pay | Admitting: Genetic Counselor

## 2020-10-08 DIAGNOSIS — Z1379 Encounter for other screening for genetic and chromosomal anomalies: Secondary | ICD-10-CM | POA: Insufficient documentation

## 2020-10-08 NOTE — Telephone Encounter (Signed)
LM on VM that results are back and to please call. 

## 2020-10-10 ENCOUNTER — Ambulatory Visit: Payer: Self-pay | Admitting: Genetic Counselor

## 2020-10-10 DIAGNOSIS — C61 Malignant neoplasm of prostate: Secondary | ICD-10-CM

## 2020-10-10 NOTE — Progress Notes (Signed)
HPI:  Mr. Marxen was previously seen in the [facility] due to a personal and family history of cancer and concerns regarding a hereditary predisposition to cancer. Please refer to our prior cancer genetics clinic note for more information regarding our discussion, assessment and recommendations, at the time. Mr. Meas recent genetic test results were disclosed to him, as were recommendations warranted by these results. These results and recommendations are discussed in more detail below.  CANCER HISTORY:  Oncology History   No history exists.    FAMILY HISTORY:  We obtained a detailed, 4-generation family history.  Significant diagnoses are listed below: Family History  Problem Relation Age of Onset  . Cancer Father 48       prostate  . Leukemia Father        CLL  . Brain cancer Father   . Cancer Paternal Grandfather        prostate  . Cancer Paternal Uncle        prostate  . Lymphoma Mother   . Diabetes Maternal Aunt   . Clotting disorder Maternal Grandmother   . Other Maternal Grandfather        heart problem due to rheumatic fever  . Breast cancer Paternal Grandmother 9  . Cancer Paternal Uncle        tumor on his spine    The patient has a son and daughter who are cancer free.  He has one sister who is cancer free.  Both parents are deceased.  The patient's mother had lymphoma at 87 and died from 27. She had two sisters who reportedly are cancer free.  His maternal grandparents are deceased.  The patient's father had prostate cancer at 33, and eventually had leukemia and brain cancer.  He had three brothers.  One brother had a tumor on his spine, and another had prostate cancer.  The paternal grandparents are deceased. The grandmother had breast cancer and the grandfather had prostate cancer.  Mr. Collard is unaware of previous family history of genetic testing for hereditary cancer risks. Patient's maternal ancestors are of [place] descent, and  paternal ancestors are of [place] descent. There is no reported Ashkenazi Jewish ancestry. There is no known consanguinity.     GENETIC TEST RESULTS: Genetic testing reported out on October 05, 2020 through the CancerNext-Expanded+RNAinsight cancer panel found no pathogenic mutations. The CancerNext-Expanded gene panel offered by [facility] and includes sequencing and rearrangement analysis for the following 77 genes: AIP, ALK, APC*, ATM*, AXIN2, BAP1, BARD1, BLM, BMPR1A, BRCA1*, BRCA2*, BRIP1*, CDC73, CDH1*, CDK4, CDKN1B, CDKN2A, CHEK2*, CTNNA1, DICER1, FANCC, FH, FLCN, GALNT12, KIF1B, LZTR1, MAX, MEN1, MET, MLH1*, MSH2*, MSH3, MSH6*, MUTYH*, NBN, NF1*, NF2, NTHL1, PALB2*, PHOX2B, PMS2*, POT1, PRKAR1A, PTCH1, PTEN*, RAD51C*, RAD51D*, RB1, RECQL, RET, SDHA, SDHAF2, SDHB, SDHC, SDHD, SMAD4, SMARCA4, SMARCB1, SMARCE1, STK11, SUFU, TMEM127, TP53*, TSC1, TSC2, VHL and XRCC2 (sequencing and deletion/duplication); EGFR, EGLN1, HOXB13, KIT, MITF, PDGFRA, POLD1, and POLE (sequencing only); EPCAM and GREM1 (deletion/duplication only). DNA and RNA analyses performed for * genes. The test report has been scanned into EPIC and is located under the Molecular Pathology section of the Results Review tab.  A portion of the result report is included below for reference.     We discussed with Mr. Mareno that because current genetic testing is not perfect, it is possible there may be a gene mutation in one of these genes that current testing cannot detect, but that chance is small.  We also discussed, that there  could be another gene that has not yet been discovered, or that we have not yet tested, that is responsible for the cancer diagnoses in the family. It is also possible there is a hereditary cause for the cancer in the family that Mr. Dais did not inherit and therefore was not identified in his testing.  Therefore, it is important to remain in touch with cancer genetics in the future so that we can continue to  offer Mr. Rudin the most up to date genetic testing.   ADDITIONAL GENETIC TESTING: We discussed with Mr. Barich that his genetic testing was fairly extensive.  If there are genes identified to increase cancer risk that can be analyzed in the future, we would be happy to discuss and coordinate this testing at that time.    CANCER SCREENING RECOMMENDATIONS: Mr. Frayre test result is considered negative (normal).  This means that we have not identified a hereditary cause for his personal and family history of cancer at this time. Most cancers happen by chance and this negative test suggests that his cancer may fall into this category.    While reassuring, this does not definitively rule out a hereditary predisposition to cancer. It is still possible that there could be genetic mutations that are undetectable by current technology. There could be genetic mutations in genes that have not been tested or identified to increase cancer risk.  Therefore, it is recommended he continue to follow the cancer management and screening guidelines provided by his oncology and primary healthcare provider.   An individual's cancer risk and medical management are not determined by genetic test results alone. Overall cancer risk assessment incorporates additional factors, including personal medical history, family history, and any available genetic information that may result in a personalized plan for cancer prevention and surveillance  RECOMMENDATIONS FOR FAMILY MEMBERS:  Individuals in this family might be at some increased risk of developing cancer, over the general population risk, simply due to the family history of cancer.  We recommended women in this family have a yearly mammogram beginning at age 12, or 42 years younger than the earliest onset of cancer, an annual clinical breast exam, and perform monthly breast self-exams. Women in this family should also have a gynecological exam as recommended by their primary  provider. All family members should be referred for colonoscopy starting at age 72.  FOLLOW-UP: Lastly, we discussed with Mr. Higbie that cancer genetics is a rapidly advancing field and it is possible that new genetic tests will be appropriate for him and/or his family members in the future. We encouraged him to remain in contact with cancer genetics on an annual basis so we can update his personal and family histories and let him know of advances in cancer genetics that may benefit this family.   Our contact number was provided. Mr. Poland questions were answered to his satisfaction, and he knows he is welcome to call us at anytime with additional questions or concerns.   Roma Kayser, [place], [facility] Licensed, Certified Genetic Counselor [email or web address]

## 2020-10-10 NOTE — Telephone Encounter (Signed)
Revealed negative genetic testing.  Discussed that we do not know why he has prostate cancer or why there is cancer in the family. It could be due to a different gene that we are not testing, or maybe our current technology may not be able to pick something up.  It will be important for him to keep in contact with genetics to keep up with whether additional testing may be needed.   

## 2020-10-10 NOTE — Telephone Encounter (Signed)
Left second message that results are back and to please call.

## 2020-11-19 ENCOUNTER — Other Ambulatory Visit (HOSPITAL_COMMUNITY): Payer: Self-pay

## 2020-11-28 ENCOUNTER — Other Ambulatory Visit (HOSPITAL_COMMUNITY): Payer: Self-pay

## 2020-12-03 ENCOUNTER — Other Ambulatory Visit (HOSPITAL_COMMUNITY): Payer: Self-pay

## 2020-12-03 ENCOUNTER — Other Ambulatory Visit: Payer: Self-pay | Admitting: Oncology

## 2020-12-03 DIAGNOSIS — C61 Malignant neoplasm of prostate: Secondary | ICD-10-CM

## 2020-12-03 MED ORDER — ABIRATERONE ACETATE 250 MG PO TABS
ORAL_TABLET | ORAL | 2 refills | Status: DC
  Filled 2020-12-03: qty 120, 30d supply, fill #0
  Filled 2020-12-31: qty 120, 30d supply, fill #1
  Filled 2021-01-28: qty 120, 30d supply, fill #2

## 2020-12-03 MED ORDER — PREDNISONE 5 MG PO TABS
ORAL_TABLET | Freq: Every day | ORAL | 3 refills | Status: DC
  Filled 2020-12-03: qty 30, 30d supply, fill #0
  Filled 2021-04-01: qty 30, 30d supply, fill #1
  Filled 2021-05-27: qty 30, 30d supply, fill #2
  Filled 2021-06-24: qty 30, 30d supply, fill #3
  Filled 2021-07-31: qty 30, 30d supply, fill #4
  Filled 2021-08-21: qty 30, 30d supply, fill #5
  Filled 2021-09-23 (×2): qty 30, 30d supply, fill #6
  Filled 2021-10-22: qty 30, 30d supply, fill #7
  Filled 2021-11-24: qty 30, 30d supply, fill #8

## 2020-12-03 MED FILL — Calcium Carbonate-Cholecalciferol Tab 500 MG-5 MCG(200 Unit): ORAL | 30 days supply | Qty: 60 | Fill #0 | Status: AC

## 2020-12-04 ENCOUNTER — Other Ambulatory Visit (HOSPITAL_COMMUNITY): Payer: Self-pay

## 2020-12-05 ENCOUNTER — Other Ambulatory Visit (HOSPITAL_COMMUNITY): Payer: Self-pay

## 2020-12-17 ENCOUNTER — Inpatient Hospital Stay: Payer: BC Managed Care – PPO

## 2020-12-17 ENCOUNTER — Other Ambulatory Visit: Payer: Self-pay

## 2020-12-17 ENCOUNTER — Inpatient Hospital Stay: Payer: BC Managed Care – PPO | Attending: Oncology

## 2020-12-17 ENCOUNTER — Inpatient Hospital Stay: Payer: BC Managed Care – PPO | Admitting: Oncology

## 2020-12-17 VITALS — BP 116/79 | HR 57 | Temp 97.1°F | Resp 17 | Wt 161.4 lb

## 2020-12-17 DIAGNOSIS — C7951 Secondary malignant neoplasm of bone: Secondary | ICD-10-CM | POA: Insufficient documentation

## 2020-12-17 DIAGNOSIS — C61 Malignant neoplasm of prostate: Secondary | ICD-10-CM | POA: Insufficient documentation

## 2020-12-17 DIAGNOSIS — Z79899 Other long term (current) drug therapy: Secondary | ICD-10-CM | POA: Insufficient documentation

## 2020-12-17 DIAGNOSIS — Z5111 Encounter for antineoplastic chemotherapy: Secondary | ICD-10-CM | POA: Diagnosis present

## 2020-12-17 LAB — CBC WITH DIFFERENTIAL (CANCER CENTER ONLY)
Abs Immature Granulocytes: 0.01 10*3/uL (ref 0.00–0.07)
Basophils Absolute: 0 10*3/uL (ref 0.0–0.1)
Basophils Relative: 1 %
Eosinophils Absolute: 0.2 10*3/uL (ref 0.0–0.5)
Eosinophils Relative: 3 %
HCT: 42.4 % (ref 39.0–52.0)
Hemoglobin: 14.5 g/dL (ref 13.0–17.0)
Immature Granulocytes: 0 %
Lymphocytes Relative: 42 %
Lymphs Abs: 2.1 10*3/uL (ref 0.7–4.0)
MCH: 30.3 pg (ref 26.0–34.0)
MCHC: 34.2 g/dL (ref 30.0–36.0)
MCV: 88.7 fL (ref 80.0–100.0)
Monocytes Absolute: 0.5 10*3/uL (ref 0.1–1.0)
Monocytes Relative: 9 %
Neutro Abs: 2.3 10*3/uL (ref 1.7–7.7)
Neutrophils Relative %: 45 %
Platelet Count: 222 10*3/uL (ref 150–400)
RBC: 4.78 MIL/uL (ref 4.22–5.81)
RDW: 12.4 % (ref 11.5–15.5)
WBC Count: 5 10*3/uL (ref 4.0–10.5)
nRBC: 0 % (ref 0.0–0.2)

## 2020-12-17 LAB — CMP (CANCER CENTER ONLY)
ALT: 23 U/L (ref 0–44)
AST: 23 U/L (ref 15–41)
Albumin: 4 g/dL (ref 3.5–5.0)
Alkaline Phosphatase: 57 U/L (ref 38–126)
Anion gap: 11 (ref 5–15)
BUN: 22 mg/dL — ABNORMAL HIGH (ref 6–20)
CO2: 26 mmol/L (ref 22–32)
Calcium: 9.4 mg/dL (ref 8.9–10.3)
Chloride: 107 mmol/L (ref 98–111)
Creatinine: 0.93 mg/dL (ref 0.61–1.24)
GFR, Estimated: >60 mL/min (ref >60)
Glucose, Bld: 65 mg/dL — ABNORMAL LOW (ref 70–99)
Potassium: 4.4 mmol/L (ref 3.5–5.1)
Sodium: 144 mmol/L (ref 135–145)
Total Bilirubin: 1.5 mg/dL — ABNORMAL HIGH (ref 0.3–1.2)
Total Protein: 6.9 g/dL (ref 6.5–8.1)

## 2020-12-17 MED ORDER — LEUPROLIDE ACETATE (3 MONTH) 22.5 MG ~~LOC~~ KIT
PACK | SUBCUTANEOUS | Status: AC
  Filled 2020-12-17: qty 22.5

## 2020-12-17 MED ORDER — DENOSUMAB 120 MG/1.7ML ~~LOC~~ SOLN
120.0000 mg | Freq: Once | SUBCUTANEOUS | Status: AC
  Administered 2020-12-17: 120 mg via SUBCUTANEOUS

## 2020-12-17 MED ORDER — LEUPROLIDE ACETATE (3 MONTH) 22.5 MG ~~LOC~~ KIT
22.5000 mg | PACK | Freq: Once | SUBCUTANEOUS | Status: AC
  Administered 2020-12-17: 22.5 mg via SUBCUTANEOUS

## 2020-12-17 MED ORDER — DENOSUMAB 120 MG/1.7ML ~~LOC~~ SOLN
SUBCUTANEOUS | Status: AC
  Filled 2020-12-17: qty 1.7

## 2020-12-17 NOTE — Progress Notes (Signed)
Hematology and Oncology Follow Up Visit  David Huffman [id number] [birth year] 60 y.o. 12/17/2020 8:27 AM  CC: David Messick, MD    Principle Diagnosis: 60 year old man with advanced prostate cancer with disease to the bone diagnosed in 2018.  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 Status post prostatectomy done in 2008, pathology revealing T2c N0 with 0 out of 12 lymph nodes involved.  2. The patient continued to have rise in his PSA up to 52 after prostatectomy without any measurable disease.  Lupron was started at that time. 3.     Casodex 50 mg daily added in 05/2011. Therapy discontinued in March 2018 because of progression of disease. 4.     Status post radiation therapy to the pelvic bone completed in June 2019 he received 50 Gy in 10 fractions.   Current therapy:  Eligard 22.5 mg every 3 months.  This will be repeated today.  Zytiga 1000 mg daily with prednisone 5 mg daily since May 2018.  Xgeva 120 mg every 3 months started on 06/20/2019.  He will receive injection today.   Interim History:  David Huffman presents today for a follow-up visit.  Since the last visit, he reports no major changes in his health.  He continues to tolerate Zytiga without any recent complaints.  He denies any nausea, fatigue or edema.  He remains active continues to work full-time.  He is also very active physically with exercise including biking among others.        Medications: Updated on review. Current Outpatient Medications  Medication Sig Dispense Refill  . abiraterone acetate (ZYTIGA) 250 MG tablet TAKE 4 TABLETS (1000MG ) BY MOUTH DAILY ON AN EMPTY STOMACH 1 HOUR BEFORE OR 2 HOURS AFTER A MEAL 120 tablet 2  . Ascorbic Acid (VITAMIN C) 1000 MG tablet Take 1,000 mg by mouth daily.    Marland Kitchen b complex vitamins tablet Take 1 tablet by mouth daily.    . Calcium Carb-Cholecalciferol (OYSTER SHELL CALCIUM W/D)  500-200 MG-UNIT TABS Take 1 tablet by mouth 2 (two) times daily.    . Calcium Carb-Cholecalciferol (OYSTER SHELL CALCIUM W/D) 500-200 MG-UNIT TABS TAKE 1 TABLET BY MOUTH TWO TIMES DAILY 60 tablet 3  . Calcium Carb-Cholecalciferol (OYSTER SHELL CALCIUM W/D) 500-200 MG-UNIT TABS TAKE 1 TABLET BY MOUTH TWO TIMES DAILY 60 tablet 3  . denosumab (XGEVA) 120 MG/1.7ML SOLN injection Inject 120 mg into the skin once.    Marland Kitchen leuprolide (LUPRON) 22.5 MG injection Inject 22.5 mg into the muscle every 3 (three) months.    . Melatonin 10 MG CAPS Take 12 mg 2 (two) times daily at 8 am and 10 pm by mouth. (Patient not taking: Reported on 06/03/2020)    . Multiple Vitamins-Minerals (MULTIVITAMIN PO) Take 1 tablet by mouth daily. (Patient not taking: Reported on 06/03/2020)    . Omega-3 Fatty Acids (FISH OIL) 1000 MG CAPS Take 1,000 mg by mouth daily. (Patient not taking: Reported on 06/03/2020)    . predniSONE (DELTASONE) 5 MG tablet TAKE 1 TABLET (5 MG TOTAL) BY MOUTH DAILY WITH BREAKFAST. 90 tablet 3  . VITAMIN D, ERGOCALCIFEROL, PO Take 1 tablet by mouth daily.     No current facility-administered medications for this visit.    Allergies: No Known Allergies    Physical Exam:     Blood pressure 116/79, pulse (!) 57, temperature (!) 97.1 F (36.2 C), temperature source Tympanic,  resp. rate 17, weight 161 lb 6.4 oz (73.2 kg), SpO2 100 %.        ECOG: 0    General appearance: Alert, awake without any distress. Head: Atraumatic without abnormalities Oropharynx: Without any thrush or ulcers. Eyes: No scleral icterus. Lymph nodes: No lymphadenopathy noted in the cervical, supraclavicular, or axillary nodes Heart:regular rate and rhythm, without any murmurs or gallops.   Lung: Clear to auscultation without any rhonchi, wheezes or dullness to percussion. Abdomin: Soft, nontender without any shifting dullness or ascites. Musculoskeletal: No clubbing or cyanosis. Neurological: No motor or sensory  deficits. Skin: No rashes or lesions.             Lab Results: Lab Results  Component Value Date   WBC 4.4 09/20/2020   HGB 14.2 09/20/2020   HCT 42.1 09/20/2020   MCV 88.3 09/20/2020   PLT 230 09/20/2020     Chemistry      Component Value Date/Time   NA 140 09/20/2020 0829   NA 142 08/18/2017 0754   K 4.1 09/20/2020 0829   K 4.2 08/18/2017 0754   CL 107 09/20/2020 0829   CL 106 01/03/2013 0916   CO2 27 09/20/2020 0829   CO2 25 08/18/2017 0754   BUN 19 09/20/2020 0829   BUN 14.0 08/18/2017 0754   CREATININE 1.02 09/20/2020 0829   CREATININE 1.00 06/03/2020 1047   CREATININE 1.0 08/18/2017 0754      Component Value Date/Time   CALCIUM 9.2 09/20/2020 0829   CALCIUM 9.5 08/18/2017 0754   ALKPHOS 60 09/20/2020 0829   ALKPHOS 106 08/18/2017 0754   AST 21 09/20/2020 0829   AST 31 08/18/2017 0754   ALT 19 09/20/2020 0829   ALT 39 08/18/2017 0754   BILITOT 1.9 (H) 09/20/2020 0829   BILITOT 1.26 (H) 08/18/2017 0754        Results for David Huffman (MRN [id number]) as of 12/17/2020 08:28  Ref. Range 03/19/2020 08:05 06/18/2020 11:20 09/20/2020 08:29  Prostate Specific Ag, Serum Latest Ref Range: 0.0 - 4.0 ng/mL <0.1 <0.1 <0.1       ASSESSMENT AND PLAN:   60 year old man with:  1.  Advanced prostate cancer diagnosed in 2018.  He has castration-resistant disease at this time.  The natural course of his disease was updated at this time and treatment options were reviewed.  His PSA continues to be undetectable with reasonable tolerance to Zytiga.  Risks and benefits of continuing this treatment were reiterated at this time.  Complications that include nausea, fatigue, edema and hypertension were reiterated.  He is agreeable to continue.  Alternative therapy including systemic chemotherapy will be deferred at this time.   2.  Right hip metastatic lesion: No recent exacerbation noted at this time.  3. Androgen depravation: He remains on Eligard without any  complications.  I recommended continuing this indefinitely.  Long-term issues including osteoporosis, weight gain and sexual dysfunction.  4. Bone health: He is currently on Xgeva without any issues.  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   5. Follow-up: 3 months for a follow-up visit.  30 minutes were dedicated to this encounter.  Time was spent on reviewing laboratory data, disease status update and outlining future plan of care.  Zola Button, MD 4/26/20228:27 AM

## 2020-12-17 NOTE — Patient Instructions (Signed)
Denosumab injection What is this medicine? DENOSUMAB (den oh sue mab) slows bone breakdown. Prolia is used to treat osteoporosis in women after menopause and in men, and in people who are taking corticosteroids for 6 months or more. Xgeva is used to treat a high calcium level due to cancer and to prevent bone fractures and other bone problems caused by multiple myeloma or cancer bone metastases. Xgeva is also used to treat giant cell tumor of the bone. This medicine may be used for other purposes; ask your health care provider or pharmacist if you have questions. COMMON BRAND NAME(S): Prolia, XGEVA What should I tell my health care provider before I take this medicine? They need to know if you have any of these conditions:  dental disease  having surgery or tooth extraction  infection  kidney disease  low levels of calcium or Vitamin D in the blood  malnutrition  on hemodialysis  skin conditions or sensitivity  thyroid or parathyroid disease  an unusual reaction to denosumab, other medicines, foods, dyes, or preservatives  pregnant or trying to get pregnant  breast-feeding How should I use this medicine? This medicine is for injection under the skin. It is given by a health care professional in a hospital or clinic setting. A special MedGuide will be given to you before each treatment. Be sure to read this information carefully each time. For Prolia, talk to your pediatrician regarding the use of this medicine in children. Special care may be needed. For Xgeva, talk to your pediatrician regarding the use of this medicine in children. While this drug may be prescribed for children as young as 13 years for selected conditions, precautions do apply. Overdosage: If you think you have taken too much of this medicine contact a poison control center or emergency room at once. NOTE: This medicine is only for you. Do not share this medicine with others. What if I miss a dose? It is  important not to miss your dose. Call your doctor or health care professional if you are unable to keep an appointment. What may interact with this medicine? Do not take this medicine with any of the following medications:  other medicines containing denosumab This medicine may also interact with the following medications:  medicines that lower your chance of fighting infection  steroid medicines like prednisone or cortisone This list may not describe all possible interactions. Give your health care provider a list of all the medicines, herbs, non-prescription drugs, or dietary supplements you use. Also tell them if you smoke, drink alcohol, or use illegal drugs. Some items may interact with your medicine. What should I watch for while using this medicine? Visit your doctor or health care professional for regular checks on your progress. Your doctor or health care professional may order blood tests and other tests to see how you are doing. Call your doctor or health care professional for advice if you get a fever, chills or sore throat, or other symptoms of a cold or flu. Do not treat yourself. This drug may decrease your body's ability to fight infection. Try to avoid being around people who are sick. You should make sure you get enough calcium and vitamin D while you are taking this medicine, unless your doctor tells you not to. Discuss the foods you eat and the vitamins you take with your health care professional. See your dentist regularly. Brush and floss your teeth as directed. Before you have any dental work done, tell your dentist you are   receiving this medicine. Do not become pregnant while taking this medicine or for 5 months after stopping it. Talk with your doctor or health care professional about your birth control options while taking this medicine. Women should inform their doctor if they wish to become pregnant or think they might be pregnant. There is a potential for serious side  effects to an unborn child. Talk to your health care professional or pharmacist for more information. What side effects may I notice from receiving this medicine? Side effects that you should report to your doctor or health care professional as soon as possible:  allergic reactions like skin rash, itching or hives, swelling of the face, lips, or tongue  bone pain  breathing problems  dizziness  jaw pain, especially after dental work  redness, blistering, peeling of the skin  signs and symptoms of infection like fever or chills; cough; sore throat; pain or trouble passing urine  signs of low calcium like fast heartbeat, muscle cramps or muscle pain; pain, tingling, numbness in the hands or feet; seizures  unusual bleeding or bruising  unusually weak or tired Side effects that usually do not require medical attention (report to your doctor or health care professional if they continue or are bothersome):  constipation  diarrhea  headache  joint pain  loss of appetite  muscle pain  runny nose  tiredness  upset stomach This list may not describe all possible side effects. Call your doctor for medical advice about side effects. You may report side effects to FDA at 1-800-FDA-1088. Where should I keep my medicine? This medicine is only given in a clinic, doctor's office, or other health care setting and will not be stored at home. NOTE: This sheet is a summary. It may not cover all possible information. If you have questions about this medicine, talk to your doctor, pharmacist, or health care provider.  2021 Elsevier/Gold Standard (2017-12-17 16:10:44) Leuprolide depot injection What is this medicine? LEUPROLIDE (loo PROE lide) is a man-made protein that acts like a natural hormone in the body. It decreases testosterone in men and decreases estrogen in women. In men, this medicine is used to treat advanced prostate cancer. In women, some forms of this medicine may be used  to treat endometriosis, uterine fibroids, or other male hormone-related problems. This medicine may be used for other purposes; ask your health care provider or pharmacist if you have questions. COMMON BRAND NAME(S): Eligard, Fensolv, Lupron Depot, Lupron Depot-Ped, Viadur What should I tell my health care provider before I take this medicine? They need to know if you have any of these conditions:  diabetes  heart disease or previous heart attack  high blood pressure  high cholesterol  mental illness  osteoporosis  pain or difficulty passing urine  seizures  spinal cord metastasis  stroke  suicidal thoughts, plans, or attempt; a previous suicide attempt by you or a family member  tobacco smoker  unusual vaginal bleeding (women)  an unusual or allergic reaction to leuprolide, benzyl alcohol, other medicines, foods, dyes, or preservatives  pregnant or trying to get pregnant  breast-feeding How should I use this medicine? This medicine is for injection into a muscle or for injection under the skin. It is given by a health care professional in a hospital or clinic setting. The specific product will determine how it will be given to you. Make sure you understand which product you receive and how often you will receive it. Talk to your pediatrician regarding the use of   this medicine in children. Special care may be needed. Overdosage: If you think you have taken too much of this medicine contact a poison control center or emergency room at once. NOTE: This medicine is only for you. Do not share this medicine with others. What if I miss a dose? It is important not to miss a dose. Call your doctor or health care professional if you are unable to keep an appointment. Depot injections: Depot injections are given either once-monthly, every 12 weeks, every 16 weeks, or every 24 weeks depending on the product you are prescribed. The product you are prescribed will be based on if you  are male or male, and your condition. Make sure you understand your product and dosing. What may interact with this medicine? Do not take this medicine with any of the following medications:  chasteberry  cisapride  dronedarone  pimozide  thioridazine This medicine may also interact with the following medications:  herbal or dietary supplements, like black cohosh or DHEA  male hormones, like estrogens or progestins and birth control pills, patches, rings, or injections  male hormones, like testosterone  other medicines that prolong the QT interval (abnormal heart rhythm) This list may not describe all possible interactions. Give your health care provider a list of all the medicines, herbs, non-prescription drugs, or dietary supplements you use. Also tell them if you smoke, drink alcohol, or use illegal drugs. Some items may interact with your medicine. What should I watch for while using this medicine? Visit your doctor or health care professional for regular checks on your progress. During the first weeks of treatment, your symptoms may get worse, but then will improve as you continue your treatment. You may get hot flashes, increased bone pain, increased difficulty passing urine, or an aggravation of nerve symptoms. Discuss these effects with your doctor or health care professional, some of them may improve with continued use of this medicine. Male patients may experience a menstrual cycle or spotting during the first months of therapy with this medicine. If this continues, contact your doctor or health care professional. This medicine may increase blood sugar. Ask your healthcare provider if changes in diet or medicines are needed if you have diabetes. What side effects may I notice from receiving this medicine? Side effects that you should report to your doctor or health care professional as soon as possible:  allergic reactions like skin rash, itching or hives, swelling of  the face, lips, or tongue  breathing problems  chest pain  depression or memory disorders  pain in your legs or groin  pain at site where injected or implanted  seizures  severe headache  signs and symptoms of high blood sugar such as being more thirsty or hungry or having to urinate more than normal. You may also feel very tired or have blurry vision  swelling of the feet and legs  suicidal thoughts or other mood changes  visual changes  vomiting Side effects that usually do not require medical attention (report to your doctor or health care professional if they continue or are bothersome):  breast swelling or tenderness  decrease in sex drive or performance  diarrhea  hot flashes  loss of appetite  muscle, joint, or bone pains  nausea  redness or irritation at site where injected or implanted  skin problems or acne This list may not describe all possible side effects. Call your doctor for medical advice about side effects. You may report side effects to FDA at 1-800-FDA-1088.   Where should I keep my medicine? This drug is given in a hospital or clinic and will not be stored at home. NOTE: This sheet is a summary. It may not cover all possible information. If you have questions about this medicine, talk to your doctor, pharmacist, or health care provider.  2021 Elsevier/Gold Standard (2019-07-12 10:35:13)  

## 2020-12-18 ENCOUNTER — Telehealth: Payer: Self-pay | Admitting: Oncology

## 2020-12-18 ENCOUNTER — Telehealth: Payer: Self-pay | Admitting: *Deleted

## 2020-12-18 LAB — PROSTATE-SPECIFIC AG, SERUM (LABCORP): Prostate Specific Ag, Serum: <0.1 ng/mL (ref 0.0–4.0)

## 2020-12-18 NOTE — Telephone Encounter (Signed)
-----   Message from Wyatt Portela, MD sent at 12/18/2020  9:43 AM EDT ----- Please let him know his PSA is still very low

## 2020-12-18 NOTE — Telephone Encounter (Signed)
Scheduled follow-up appointments per 4/26 los. Patient is aware. 

## 2020-12-18 NOTE — Telephone Encounter (Signed)
PC to patient, informed him PSA is <0.1   Patient verbalizes understanding. 

## 2020-12-26 ENCOUNTER — Other Ambulatory Visit (HOSPITAL_COMMUNITY): Payer: Self-pay

## 2020-12-31 ENCOUNTER — Other Ambulatory Visit (HOSPITAL_COMMUNITY): Payer: Self-pay

## 2020-12-31 ENCOUNTER — Other Ambulatory Visit: Payer: Self-pay | Admitting: Oncology

## 2020-12-31 MED ORDER — OYSTER SHELL CALCIUM W/D 500-200 MG-UNIT PO TABS
1.0000 | ORAL_TABLET | Freq: Two times a day (BID) | ORAL | 3 refills | Status: DC
  Filled 2020-12-31: qty 60, 30d supply, fill #0
  Filled 2021-01-28: qty 60, 30d supply, fill #1
  Filled 2021-03-21 – 2021-04-30 (×4): qty 60, 30d supply, fill #2
  Filled 2021-05-27: qty 60, 30d supply, fill #3

## 2021-01-01 ENCOUNTER — Other Ambulatory Visit (HOSPITAL_COMMUNITY): Payer: Self-pay

## 2021-01-28 ENCOUNTER — Other Ambulatory Visit (HOSPITAL_COMMUNITY): Payer: Self-pay

## 2021-01-30 ENCOUNTER — Other Ambulatory Visit (HOSPITAL_COMMUNITY): Payer: Self-pay

## 2021-01-30 ENCOUNTER — Encounter: Payer: Self-pay | Admitting: Oncology

## 2021-02-03 ENCOUNTER — Other Ambulatory Visit (HOSPITAL_BASED_OUTPATIENT_CLINIC_OR_DEPARTMENT_OTHER): Payer: Self-pay

## 2021-02-06 ENCOUNTER — Other Ambulatory Visit (HOSPITAL_COMMUNITY): Payer: Self-pay

## 2021-02-25 ENCOUNTER — Other Ambulatory Visit (HOSPITAL_COMMUNITY): Payer: Self-pay

## 2021-02-25 ENCOUNTER — Other Ambulatory Visit: Payer: Self-pay | Admitting: Oncology

## 2021-02-25 DIAGNOSIS — C61 Malignant neoplasm of prostate: Secondary | ICD-10-CM

## 2021-02-25 MED ORDER — ABIRATERONE ACETATE 250 MG PO TABS
ORAL_TABLET | ORAL | 2 refills | Status: DC
  Filled 2021-02-25: qty 120, 30d supply, fill #0
  Filled 2021-04-01: qty 120, 30d supply, fill #1
  Filled 2021-04-21: qty 120, 30d supply, fill #2

## 2021-02-27 ENCOUNTER — Other Ambulatory Visit (HOSPITAL_COMMUNITY): Payer: Self-pay

## 2021-03-14 ENCOUNTER — Telehealth: Payer: Self-pay | Admitting: Oncology

## 2021-03-14 NOTE — Telephone Encounter (Signed)
Rescheduled 07/28 appointment to 08/09 and 08/11 per provider pal, called and left a voicemail.

## 2021-03-18 ENCOUNTER — Other Ambulatory Visit: Payer: BC Managed Care – PPO

## 2021-03-20 ENCOUNTER — Ambulatory Visit: Payer: BC Managed Care – PPO | Admitting: Oncology

## 2021-03-20 ENCOUNTER — Ambulatory Visit: Payer: BC Managed Care – PPO

## 2021-03-21 ENCOUNTER — Encounter: Payer: Self-pay | Admitting: Oncology

## 2021-03-21 ENCOUNTER — Other Ambulatory Visit (HOSPITAL_COMMUNITY): Payer: Self-pay

## 2021-03-24 ENCOUNTER — Other Ambulatory Visit (HOSPITAL_COMMUNITY): Payer: Self-pay

## 2021-04-01 ENCOUNTER — Inpatient Hospital Stay: Payer: BC Managed Care – PPO | Attending: Oncology

## 2021-04-01 ENCOUNTER — Other Ambulatory Visit (HOSPITAL_COMMUNITY): Payer: Self-pay

## 2021-04-01 ENCOUNTER — Other Ambulatory Visit: Payer: Self-pay

## 2021-04-01 ENCOUNTER — Encounter: Payer: Self-pay | Admitting: Oncology

## 2021-04-01 DIAGNOSIS — Z5111 Encounter for antineoplastic chemotherapy: Secondary | ICD-10-CM | POA: Insufficient documentation

## 2021-04-01 DIAGNOSIS — C61 Malignant neoplasm of prostate: Secondary | ICD-10-CM | POA: Insufficient documentation

## 2021-04-01 DIAGNOSIS — C7951 Secondary malignant neoplasm of bone: Secondary | ICD-10-CM | POA: Insufficient documentation

## 2021-04-01 DIAGNOSIS — Z79899 Other long term (current) drug therapy: Secondary | ICD-10-CM | POA: Diagnosis not present

## 2021-04-01 LAB — CMP (CANCER CENTER ONLY)
ALT: 18 U/L (ref 0–44)
AST: 21 U/L (ref 15–41)
Albumin: 4 g/dL (ref 3.5–5.0)
Alkaline Phosphatase: 63 U/L (ref 38–126)
Anion gap: 8 (ref 5–15)
BUN: 19 mg/dL (ref 6–20)
CO2: 25 mmol/L (ref 22–32)
Calcium: 10 mg/dL (ref 8.9–10.3)
Chloride: 108 mmol/L (ref 98–111)
Creatinine: 0.95 mg/dL (ref 0.61–1.24)
GFR, Estimated: >60 mL/min (ref >60)
Glucose, Bld: 89 mg/dL (ref 70–99)
Potassium: 4.2 mmol/L (ref 3.5–5.1)
Sodium: 141 mmol/L (ref 135–145)
Total Bilirubin: 2 mg/dL — ABNORMAL HIGH (ref 0.3–1.2)
Total Protein: 6.8 g/dL (ref 6.5–8.1)

## 2021-04-01 LAB — CBC WITH DIFFERENTIAL (CANCER CENTER ONLY)
Abs Immature Granulocytes: 0.02 10*3/uL (ref 0.00–0.07)
Basophils Absolute: 0 10*3/uL (ref 0.0–0.1)
Basophils Relative: 1 %
Eosinophils Absolute: 0.1 10*3/uL (ref 0.0–0.5)
Eosinophils Relative: 2 %
HCT: 42.1 % (ref 39.0–52.0)
Hemoglobin: 14.8 g/dL (ref 13.0–17.0)
Immature Granulocytes: 0 %
Lymphocytes Relative: 21 %
Lymphs Abs: 1.4 10*3/uL (ref 0.7–4.0)
MCH: 30.6 pg (ref 26.0–34.0)
MCHC: 35.2 g/dL (ref 30.0–36.0)
MCV: 87.2 fL (ref 80.0–100.0)
Monocytes Absolute: 0.4 10*3/uL (ref 0.1–1.0)
Monocytes Relative: 6 %
Neutro Abs: 4.7 10*3/uL (ref 1.7–7.7)
Neutrophils Relative %: 70 %
Platelet Count: 213 10*3/uL (ref 150–400)
RBC: 4.83 MIL/uL (ref 4.22–5.81)
RDW: 12.7 % (ref 11.5–15.5)
WBC Count: 6.6 10*3/uL (ref 4.0–10.5)
nRBC: 0 % (ref 0.0–0.2)

## 2021-04-02 ENCOUNTER — Other Ambulatory Visit (HOSPITAL_COMMUNITY): Payer: Self-pay

## 2021-04-02 LAB — PROSTATE-SPECIFIC AG, SERUM (LABCORP): Prostate Specific Ag, Serum: <0.1 ng/mL (ref 0.0–4.0)

## 2021-04-03 ENCOUNTER — Other Ambulatory Visit: Payer: Self-pay

## 2021-04-03 ENCOUNTER — Inpatient Hospital Stay (HOSPITAL_BASED_OUTPATIENT_CLINIC_OR_DEPARTMENT_OTHER): Payer: BC Managed Care – PPO | Admitting: Oncology

## 2021-04-03 ENCOUNTER — Inpatient Hospital Stay: Payer: BC Managed Care – PPO

## 2021-04-03 ENCOUNTER — Other Ambulatory Visit (HOSPITAL_COMMUNITY): Payer: Self-pay

## 2021-04-03 VITALS — BP 129/80 | HR 70 | Temp 97.6°F | Resp 16 | Ht 68.0 in | Wt 160.0 lb

## 2021-04-03 VITALS — BP 122/78 | HR 57 | Temp 98.1°F | Resp 16

## 2021-04-03 DIAGNOSIS — C61 Malignant neoplasm of prostate: Secondary | ICD-10-CM | POA: Diagnosis not present

## 2021-04-03 DIAGNOSIS — Z5111 Encounter for antineoplastic chemotherapy: Secondary | ICD-10-CM | POA: Diagnosis not present

## 2021-04-03 MED ORDER — LEUPROLIDE ACETATE (3 MONTH) 22.5 MG ~~LOC~~ KIT
22.5000 mg | PACK | Freq: Once | SUBCUTANEOUS | Status: AC
  Administered 2021-04-03: 22.5 mg via SUBCUTANEOUS
  Filled 2021-04-03: qty 22.5

## 2021-04-03 MED ORDER — DENOSUMAB 120 MG/1.7ML ~~LOC~~ SOLN
120.0000 mg | Freq: Once | SUBCUTANEOUS | Status: AC
  Administered 2021-04-03: 120 mg via SUBCUTANEOUS
  Filled 2021-04-03: qty 1.7

## 2021-04-03 NOTE — Progress Notes (Signed)
Hematology and Oncology Follow Up Visit  Kid Santis [id number] 03-09-1961 60 y.o. 04/03/2021 1:04 PM  CC: [name], MD    Principle Diagnosis: 60 year old man with castration-resistant advanced prostate cancer with disease to the bone diagnosed in 2018.  He presented with Gleason score of 7 and a PSA of 31 at the time of diagnosis.   Prior Therapy: Status post prostatectomy done in 2008, pathology revealing T2c N0 with 0 out of 12 lymph nodes involved.  The patient continued to have rise in his PSA up to 52 after prostatectomy without any measurable disease.  Lupron was started at that time. 3.     Casodex 50 mg daily added in 05/2011. Therapy discontinued in March 2018 because of progression of disease. 4.     Status post radiation therapy to the pelvic bone completed in June 2019 he received 50 Gy in 10 fractions.   Current therapy:  Eligard 22.5 mg every 3 months.  Next injection will be given on April 03, 2021 and repeated in 3 months.  Zytiga 1000 mg daily with prednisone 5 mg daily since May 2018.  Xgeva 120 mg every 3 months started on 06/20/2019.     Interim History:  Mr. Rufus returns today for repeat evaluation.  Since the last visit, he reports feeling well without any major complaints.  He denies any nausea, fatigue or weakness.  He denies any complications related to Zytiga.  He denies any bone pain or pathological fractures.  His performance status quality of life remained excellent.  He continues to exercise regularly.        Medications: Unchanged on review. Current Outpatient Medications  Medication Sig Dispense Refill   abiraterone acetate (ZYTIGA) 250 MG tablet TAKE 4 TABLETS ('1000MG'$ ) BY MOUTH DAILY ON AN EMPTY STOMACH 1 HOUR BEFORE OR 2 HOURS AFTER A MEAL 120 tablet 2   Ascorbic Acid (VITAMIN C) 1000 MG tablet Take 1,000 mg by mouth daily.     b complex vitamins tablet Take 1 tablet by mouth daily.     Calcium Carb-Cholecalciferol (OYSTER SHELL CALCIUM  W/D) 500-200 MG-UNIT TABS Take 1 tablet by mouth 2 (two) times daily.     Calcium Carb-Cholecalciferol (OYSTER SHELL CALCIUM W/D) 500-200 MG-UNIT TABS TAKE 1 TABLET BY MOUTH TWO TIMES DAILY 60 tablet 3   Calcium Carb-Cholecalciferol (OYSTER SHELL CALCIUM W/D) 500-200 MG-UNIT TABS TAKE 1 TABLET BY MOUTH TWO TIMES DAILY 60 tablet 3   denosumab (XGEVA) 120 MG/1.7ML SOLN injection Inject 120 mg into the skin once.     leuprolide (LUPRON) 22.5 MG injection Inject 22.5 mg into the muscle every 3 (three) months.     Melatonin 10 MG CAPS Take 12 mg by mouth 2 (two) times daily at 8 am and 10 pm.     Multiple Vitamins-Minerals (MULTIVITAMIN PO) Take 1 tablet by mouth daily.     Omega-3 Fatty Acids (FISH OIL) 1000 MG CAPS Take 1,000 mg by mouth daily.     predniSONE (DELTASONE) 5 MG tablet TAKE 1 TABLET (5 MG TOTAL) BY MOUTH DAILY WITH BREAKFAST. 90 tablet 3   VITAMIN D, ERGOCALCIFEROL, PO Take 1 tablet by mouth daily.     No current facility-administered medications for this visit.    Allergies: No Known Allergies    Physical Exam:        Blood pressure 129/80, pulse 70, temperature 97.6 F (36.4 C), temperature source Oral, resp. rate 16, height '5\' 8"'$  (1.727 m), weight 160 lb (72.6 kg), SpO2 100 %.  ECOG: 0    General appearance: Comfortable appearing without any discomfort Head: Normocephalic without any trauma Oropharynx: Mucous membranes are moist and pink without any thrush or ulcers. Eyes: Pupils are equal and round reactive to light. Lymph nodes: No cervical, supraclavicular, inguinal or axillary lymphadenopathy.   Heart:regular rate and rhythm.  S1 and S2 without leg edema. Lung: Clear without any rhonchi or wheezes.  No dullness to percussion. Abdomin: Soft, nontender, nondistended with good bowel sounds.  No hepatosplenomegaly. Musculoskeletal: No joint deformity or effusion.  Full range of motion noted. Neurological: No deficits noted on motor, sensory and deep  tendon reflex exam. Skin: No petechial rash or dryness.  Appeared moist.               Lab Results: Lab Results  Component Value Date   WBC 6.6 04/01/2021   HGB 14.8 04/01/2021   HCT 42.1 04/01/2021   MCV 87.2 04/01/2021   PLT 213 04/01/2021     Chemistry      Component Value Date/Time   NA 141 04/01/2021 1049   NA 142 08/18/2017 0754   K 4.2 04/01/2021 1049   K 4.2 08/18/2017 0754   CL 108 04/01/2021 1049   CL 106 01/03/2013 0916   CO2 25 04/01/2021 1049   CO2 25 08/18/2017 0754   BUN 19 04/01/2021 1049   BUN 14.0 08/18/2017 0754   CREATININE 0.95 04/01/2021 1049   CREATININE 1.00 06/03/2020 1047   CREATININE 1.0 08/18/2017 0754      Component Value Date/Time   CALCIUM 10.0 04/01/2021 1049   CALCIUM 9.5 08/18/2017 0754   ALKPHOS 63 04/01/2021 1049   ALKPHOS 106 08/18/2017 0754   AST 21 04/01/2021 1049   AST 31 08/18/2017 0754   ALT 18 04/01/2021 1049   ALT 39 08/18/2017 0754   BILITOT 2.0 (H) 04/01/2021 1049   BILITOT 1.26 (H) 08/18/2017 0754        Results for HERMEN, POELKER (MRN [id number]) as of 04/03/2021 13:17  Ref. Range 12/17/2020 08:40 04/01/2021 10:49  Prostate Specific Ag, Serum Latest Ref Range: 0.0 - 4.0 ng/mL <0.1 <0.1        ASSESSMENT AND PLAN:   60 year old man with:  1.  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    He is currently on Zytiga with excellent PSA response at this time that is currently undetectable.  Risks and benefits of continuing this treatment were discussed.  Complications that include nausea, fatigue, edema among others were reiterated.  He is agreeable to continuing the plan is to update his staging scans in January 2023.   2.  Right hip metastatic lesion: Status post radiation treatment without any recent exacerbation.  3. Androgen depravation: He will receive Eligard today and repeated in 3 months.  Complications including osteoporosis, sexual  dysfunction.  4. Bone health: He is currently on Xgeva in addition to calcium and vitamin D supplements.  Complications including osteonecrosis of the jaw and hypocalcemia were reviewed.   5. Follow-up: In 3 months for repeat follow-up.  30 minutes were spent on this visit.  Time was dedicated to reviewing laboratory data, disease status update and outlining future plan of care.  Zola Button, MD 8/11/20221:04 PM

## 2021-04-21 ENCOUNTER — Other Ambulatory Visit (HOSPITAL_COMMUNITY): Payer: Self-pay

## 2021-04-30 ENCOUNTER — Encounter: Payer: Self-pay | Admitting: Oncology

## 2021-04-30 ENCOUNTER — Other Ambulatory Visit (HOSPITAL_COMMUNITY): Payer: Self-pay

## 2021-05-01 ENCOUNTER — Encounter: Payer: Self-pay | Admitting: Oncology

## 2021-05-01 ENCOUNTER — Other Ambulatory Visit (HOSPITAL_COMMUNITY): Payer: Self-pay

## 2021-05-27 ENCOUNTER — Other Ambulatory Visit: Payer: Self-pay | Admitting: Oncology

## 2021-05-27 ENCOUNTER — Other Ambulatory Visit (HOSPITAL_COMMUNITY): Payer: Self-pay

## 2021-05-27 DIAGNOSIS — C61 Malignant neoplasm of prostate: Secondary | ICD-10-CM

## 2021-05-27 MED ORDER — ABIRATERONE ACETATE 250 MG PO TABS
ORAL_TABLET | ORAL | 2 refills | Status: DC
  Filled 2021-05-27: qty 120, 30d supply, fill #0
  Filled 2021-06-24: qty 120, 30d supply, fill #1
  Filled 2021-07-31: qty 120, 30d supply, fill #2

## 2021-05-28 ENCOUNTER — Other Ambulatory Visit (HOSPITAL_COMMUNITY): Payer: Self-pay

## 2021-05-29 ENCOUNTER — Other Ambulatory Visit (HOSPITAL_COMMUNITY): Payer: Self-pay

## 2021-05-29 ENCOUNTER — Encounter: Payer: Self-pay | Admitting: Oncology

## 2021-06-24 ENCOUNTER — Other Ambulatory Visit: Payer: Self-pay

## 2021-06-24 ENCOUNTER — Other Ambulatory Visit (HOSPITAL_COMMUNITY): Payer: Self-pay

## 2021-06-24 ENCOUNTER — Other Ambulatory Visit: Payer: Self-pay | Admitting: Oncology

## 2021-06-24 ENCOUNTER — Inpatient Hospital Stay: Payer: BC Managed Care – PPO | Attending: Oncology

## 2021-06-24 DIAGNOSIS — C7951 Secondary malignant neoplasm of bone: Secondary | ICD-10-CM | POA: Insufficient documentation

## 2021-06-24 DIAGNOSIS — Z79899 Other long term (current) drug therapy: Secondary | ICD-10-CM | POA: Diagnosis not present

## 2021-06-24 DIAGNOSIS — Z5111 Encounter for antineoplastic chemotherapy: Secondary | ICD-10-CM | POA: Insufficient documentation

## 2021-06-24 DIAGNOSIS — C61 Malignant neoplasm of prostate: Secondary | ICD-10-CM | POA: Diagnosis present

## 2021-06-24 LAB — CBC WITH DIFFERENTIAL (CANCER CENTER ONLY)
Abs Immature Granulocytes: 0 10*3/uL (ref 0.00–0.07)
Basophils Absolute: 0 10*3/uL (ref 0.0–0.1)
Basophils Relative: 1 %
Eosinophils Absolute: 0.1 10*3/uL (ref 0.0–0.5)
Eosinophils Relative: 3 %
HCT: 40.2 % (ref 39.0–52.0)
Hemoglobin: 13.8 g/dL (ref 13.0–17.0)
Immature Granulocytes: 0 %
Lymphocytes Relative: 43 %
Lymphs Abs: 2.1 10*3/uL (ref 0.7–4.0)
MCH: 30.1 pg (ref 26.0–34.0)
MCHC: 34.3 g/dL (ref 30.0–36.0)
MCV: 87.6 fL (ref 80.0–100.0)
Monocytes Absolute: 0.5 10*3/uL (ref 0.1–1.0)
Monocytes Relative: 9 %
Neutro Abs: 2.2 10*3/uL (ref 1.7–7.7)
Neutrophils Relative %: 44 %
Platelet Count: 243 10*3/uL (ref 150–400)
RBC: 4.59 MIL/uL (ref 4.22–5.81)
RDW: 12.4 % (ref 11.5–15.5)
WBC Count: 4.9 10*3/uL (ref 4.0–10.5)
nRBC: 0 % (ref 0.0–0.2)

## 2021-06-24 LAB — CMP (CANCER CENTER ONLY)
ALT: 18 U/L (ref 0–44)
AST: 22 U/L (ref 15–41)
Albumin: 3.9 g/dL (ref 3.5–5.0)
Alkaline Phosphatase: 63 U/L (ref 38–126)
Anion gap: 8 (ref 5–15)
BUN: 16 mg/dL (ref 6–20)
CO2: 26 mmol/L (ref 22–32)
Calcium: 9.2 mg/dL (ref 8.9–10.3)
Chloride: 109 mmol/L (ref 98–111)
Creatinine: 0.95 mg/dL (ref 0.61–1.24)
GFR, Estimated: >60 mL/min (ref >60)
Glucose, Bld: 96 mg/dL (ref 70–99)
Potassium: 4.4 mmol/L (ref 3.5–5.1)
Sodium: 143 mmol/L (ref 135–145)
Total Bilirubin: 1.1 mg/dL (ref 0.3–1.2)
Total Protein: 6.9 g/dL (ref 6.5–8.1)

## 2021-06-24 MED ORDER — OYSTER SHELL CALCIUM W/D 500-5 MG-MCG PO TABS
1.0000 | ORAL_TABLET | Freq: Two times a day (BID) | ORAL | 0 refills | Status: DC
  Filled 2021-06-24: qty 60, 30d supply, fill #0

## 2021-06-25 LAB — PROSTATE-SPECIFIC AG, SERUM (LABCORP): Prostate Specific Ag, Serum: <0.1 ng/mL (ref 0.0–4.0)

## 2021-06-26 ENCOUNTER — Encounter: Payer: Self-pay | Admitting: Oncology

## 2021-06-26 ENCOUNTER — Other Ambulatory Visit (HOSPITAL_COMMUNITY): Payer: Self-pay

## 2021-06-27 ENCOUNTER — Inpatient Hospital Stay (HOSPITAL_BASED_OUTPATIENT_CLINIC_OR_DEPARTMENT_OTHER): Payer: BC Managed Care – PPO | Admitting: Oncology

## 2021-06-27 ENCOUNTER — Other Ambulatory Visit: Payer: Self-pay

## 2021-06-27 ENCOUNTER — Inpatient Hospital Stay: Payer: BC Managed Care – PPO

## 2021-06-27 VITALS — BP 146/89 | HR 71 | Temp 97.8°F | Resp 17 | Wt 159.2 lb

## 2021-06-27 DIAGNOSIS — C61 Malignant neoplasm of prostate: Secondary | ICD-10-CM | POA: Diagnosis not present

## 2021-06-27 DIAGNOSIS — Z5111 Encounter for antineoplastic chemotherapy: Secondary | ICD-10-CM | POA: Diagnosis not present

## 2021-06-27 MED ORDER — LEUPROLIDE ACETATE (3 MONTH) 22.5 MG ~~LOC~~ KIT
22.5000 mg | PACK | Freq: Once | SUBCUTANEOUS | Status: AC
  Administered 2021-06-27: 22.5 mg via SUBCUTANEOUS
  Filled 2021-06-27: qty 22.5

## 2021-06-27 MED ORDER — DENOSUMAB 120 MG/1.7ML ~~LOC~~ SOLN
120.0000 mg | Freq: Once | SUBCUTANEOUS | Status: AC
  Administered 2021-06-27: 120 mg via SUBCUTANEOUS
  Filled 2021-06-27: qty 1.7

## 2021-06-27 NOTE — Progress Notes (Signed)
Hematology and Oncology Follow Up Visit  David Huffman [id number] April 02, 1961 60 y.o. 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   Prior Therapy: Status post prostatectomy done in 2008, pathology revealing T2c N0 with 0 out of 12 lymph nodes involved.  The patient continued to have rise in his PSA up to 52 after prostatectomy without any measurable disease.  Lupron was started at that time. 3.     Casodex 50 mg daily added in 05/2011. Therapy discontinued in March 2018 because of progression of disease. 4.     Status post radiation therapy to the pelvic bone completed in June 2019 he received 50 Gy in 10 fractions.   Current therapy:  Eligard 22.5 mg every 3 months.  He will receive injection today and repeated in 3 months.  Zytiga 1000 mg daily with prednisone 5 mg daily since May 2018.  Xgeva 120 mg every 3 months started on 06/20/2019.     Interim History:  David Huffman returns today for a follow-up visit.  Since last visit, he is feeling well without any major complaints.  He denies any nausea, vomiting or abdominal pain.  He denies any complications with fatigue.  He denies any bone pain or pathological fractures.        Medications: Updated on review. Current Outpatient Medications  Medication Sig Dispense Refill   abiraterone acetate (ZYTIGA) 250 MG tablet TAKE 4 TABLETS (1000MG ) BY MOUTH DAILY ON AN EMPTY STOMACH 1 HOUR BEFORE OR 2 HOURS AFTER A MEAL 120 tablet 2   Ascorbic Acid (VITAMIN C) 1000 MG tablet Take 1,000 mg by mouth daily.     b complex vitamins tablet Take 1 tablet by mouth daily.     Calcium Carb-Cholecalciferol (OYSTER SHELL CALCIUM W/D) 500-200 MG-UNIT TABS Take 1 tablet by mouth 2 (two) times daily.     Calcium Carb-Cholecalciferol (OYSTER  SHELL CALCIUM W/D) 500-200 MG-UNIT TABS TAKE 1 TABLET BY MOUTH TWO TIMES DAILY 60 tablet 3   Calcium Carb-Cholecalciferol (OYSTER SHELL CALCIUM W/D) 500-5 MG-MCG TABS Take 1 tablet by mouth 2 (two) times daily. 60 tablet 0   denosumab (XGEVA) 120 MG/1.7ML SOLN injection Inject 120 mg into the skin once.     leuprolide (LUPRON) 22.5 MG injection Inject 22.5 mg into the muscle every 3 (three) months.     Melatonin 10 MG CAPS Take 12 mg by mouth 2 (two) times daily at 8 am and 10 pm.     Multiple Vitamins-Minerals (MULTIVITAMIN PO) Take 1 tablet by mouth daily.     Omega-3 Fatty Acids (FISH OIL) 1000 MG CAPS Take 1,000 mg by mouth daily.     predniSONE (DELTASONE) 5 MG tablet TAKE 1 TABLET (5 MG TOTAL) BY MOUTH DAILY WITH BREAKFAST. 90 tablet 3   VITAMIN D, ERGOCALCIFEROL, PO Take 1 tablet by mouth daily.     No current facility-administered medications for this visit.    Allergies: No Known Allergies    Physical Exam:     Blood pressure (!) 146/89, pulse 71, temperature 97.8 F (36.6 C), temperature source Oral, resp. rate 17, weight 159 lb 3.2 oz (72.2 kg), SpO2 100 %.          ECOG: 0   General appearance: Alert, awake without any distress. Head: Atraumatic  without abnormalities Oropharynx: Without any thrush or ulcers. Eyes: No scleral icterus. Lymph nodes: No lymphadenopathy noted in the cervical, supraclavicular, or axillary nodes Heart:regular rate and rhythm, without any murmurs or gallops.   Lung: Clear to auscultation without any rhonchi, wheezes or dullness to percussion. Abdomin: Soft, nontender without any shifting dullness or ascites. Musculoskeletal: No clubbing or cyanosis. Neurological: No motor or sensory deficits. Skin: No rashes or lesions.              Lab Results: Lab Results  Component Value Date   WBC 4.9 06/24/2021   HGB 13.8 06/24/2021   HCT 40.2 06/24/2021   MCV 87.6 06/24/2021   PLT 243 06/24/2021     Chemistry       Component Value Date/Time   NA 143 06/24/2021 0745   NA 142 08/18/2017 0754   K 4.4 06/24/2021 0745   K 4.2 08/18/2017 0754   CL 109 06/24/2021 0745   CL 106 01/03/2013 0916   CO2 26 06/24/2021 0745   CO2 25 08/18/2017 0754   BUN 16 06/24/2021 0745   BUN 14.0 08/18/2017 0754   CREATININE 0.95 06/24/2021 0745   CREATININE 1.00 06/03/2020 1047   CREATININE 1.0 08/18/2017 0754      Component Value Date/Time   CALCIUM 9.2 06/24/2021 0745   CALCIUM 9.5 08/18/2017 0754   ALKPHOS 63 06/24/2021 0745   ALKPHOS 106 08/18/2017 0754   AST 22 06/24/2021 0745   AST 31 08/18/2017 0754   ALT 18 06/24/2021 0745   ALT 39 08/18/2017 0754   BILITOT 1.1 06/24/2021 0745   BILITOT 1.26 (H) 08/18/2017 0754         Results for David Huffman, David Huffman (MRN [id number]) as of 06/27/2021 08:02  Ref. Range 04/01/2021 10:49 06/24/2021 07:45  Prostate Specific Ag, Serum Latest Ref Range: 0.0 - 4.0 ng/mL <0.1 <0.1        ASSESSMENT AND PLAN:   60 year old man with:  1.  Advanced prostate cancer with disease to the bone diagnosed in 2018.  He has castration-resistant disease at this time.  He continues to have excellent response at this time with PSA undetectable.  Risks and benefits of continuing this treatment long-term were reviewed.  Complication clinic hypertension, fatigue and adrenal insufficiency were reiterated.  The plan is to continue with the same treatment and update his staging work-up in 3 months.  He is agreeable to proceed at this time.   2.  Right hip metastatic lesion: He is status post radiation with excellent response at this time.  3. Androgen depravation: He will receive Eligard today and repeated in 3 months.  Complications including weight gain, hot flashes and osteoporosis.  4. Bone health: He will receive Xgeva today and in 3 months.  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  He is agreeable to proceed.   5. Follow-up: He will return in 3 months for a follow-up  visit.  30 minutes were dedicated to this encounter.  The time was spent on reviewing laboratory data, disease status update and outlining future plan of care.  Zola Button, MD 11/4/20228:00 AM

## 2021-07-24 ENCOUNTER — Encounter: Payer: Self-pay | Admitting: Oncology

## 2021-07-24 ENCOUNTER — Other Ambulatory Visit (HOSPITAL_COMMUNITY): Payer: Self-pay

## 2021-07-24 ENCOUNTER — Other Ambulatory Visit: Payer: Self-pay | Admitting: Oncology

## 2021-07-24 MED ORDER — OYSTER SHELL CALCIUM W/D 500-5 MG-MCG PO TABS
1.0000 | ORAL_TABLET | Freq: Two times a day (BID) | ORAL | 0 refills | Status: DC
  Filled 2021-07-24: qty 60, 30d supply, fill #0

## 2021-07-25 ENCOUNTER — Other Ambulatory Visit (HOSPITAL_COMMUNITY): Payer: Self-pay

## 2021-07-31 ENCOUNTER — Other Ambulatory Visit (HOSPITAL_COMMUNITY): Payer: Self-pay

## 2021-08-21 ENCOUNTER — Encounter: Payer: Self-pay | Admitting: Oncology

## 2021-08-21 ENCOUNTER — Other Ambulatory Visit (HOSPITAL_COMMUNITY): Payer: Self-pay

## 2021-08-21 ENCOUNTER — Other Ambulatory Visit: Payer: Self-pay | Admitting: Oncology

## 2021-08-21 DIAGNOSIS — C61 Malignant neoplasm of prostate: Secondary | ICD-10-CM

## 2021-08-21 MED ORDER — OYSTER SHELL CALCIUM W/D 500-5 MG-MCG PO TABS
1.0000 | ORAL_TABLET | Freq: Two times a day (BID) | ORAL | 0 refills | Status: DC
  Filled 2021-08-21: qty 60, 30d supply, fill #0

## 2021-08-21 MED ORDER — ABIRATERONE ACETATE 250 MG PO TABS
ORAL_TABLET | ORAL | 2 refills | Status: DC
  Filled 2021-08-21: qty 120, 30d supply, fill #0
  Filled 2021-09-23: qty 120, 30d supply, fill #1
  Filled 2021-10-22: qty 120, 30d supply, fill #2

## 2021-08-26 ENCOUNTER — Other Ambulatory Visit (HOSPITAL_COMMUNITY): Payer: Self-pay

## 2021-09-19 ENCOUNTER — Other Ambulatory Visit: Payer: BC Managed Care – PPO

## 2021-09-23 ENCOUNTER — Other Ambulatory Visit: Payer: Self-pay | Admitting: Oncology

## 2021-09-23 ENCOUNTER — Other Ambulatory Visit (HOSPITAL_COMMUNITY): Payer: Self-pay

## 2021-09-23 ENCOUNTER — Encounter (HOSPITAL_COMMUNITY): Payer: Self-pay

## 2021-09-23 ENCOUNTER — Ambulatory Visit (HOSPITAL_COMMUNITY)
Admission: RE | Admit: 2021-09-23 | Discharge: 2021-09-23 | Disposition: A | Discharge status: Home / Self Care | Payer: BC Managed Care – PPO | Source: Ambulatory Visit | Attending: Oncology | Admitting: Oncology

## 2021-09-23 ENCOUNTER — Other Ambulatory Visit: Payer: Self-pay

## 2021-09-23 ENCOUNTER — Inpatient Hospital Stay: Payer: BC Managed Care – PPO | Attending: Oncology

## 2021-09-23 ENCOUNTER — Encounter (HOSPITAL_COMMUNITY)
Admission: RE | Admit: 2021-09-23 | Discharge: 2021-09-23 | Disposition: A | Discharge status: Home / Self Care | Payer: BC Managed Care – PPO | Source: Ambulatory Visit | Attending: Oncology | Admitting: Oncology

## 2021-09-23 DIAGNOSIS — C61 Malignant neoplasm of prostate: Secondary | ICD-10-CM | POA: Insufficient documentation

## 2021-09-23 DIAGNOSIS — C7951 Secondary malignant neoplasm of bone: Secondary | ICD-10-CM | POA: Insufficient documentation

## 2021-09-23 LAB — CBC WITH DIFFERENTIAL (CANCER CENTER ONLY)
Abs Immature Granulocytes: 0.01 10*3/uL (ref 0.00–0.07)
Basophils Absolute: 0 10*3/uL (ref 0.0–0.1)
Basophils Relative: 1 %
Eosinophils Absolute: 0.1 10*3/uL (ref 0.0–0.5)
Eosinophils Relative: 2 %
HCT: 43.3 % (ref 39.0–52.0)
Hemoglobin: 14.6 g/dL (ref 13.0–17.0)
Immature Granulocytes: 0 %
Lymphocytes Relative: 41 %
Lymphs Abs: 1.8 10*3/uL (ref 0.7–4.0)
MCH: 29.7 pg (ref 26.0–34.0)
MCHC: 33.7 g/dL (ref 30.0–36.0)
MCV: 88.2 fL (ref 80.0–100.0)
Monocytes Absolute: 0.4 10*3/uL (ref 0.1–1.0)
Monocytes Relative: 9 %
Neutro Abs: 2.1 10*3/uL (ref 1.7–7.7)
Neutrophils Relative %: 47 %
Platelet Count: 237 10*3/uL (ref 150–400)
RBC: 4.91 MIL/uL (ref 4.22–5.81)
RDW: 12.4 % (ref 11.5–15.5)
WBC Count: 4.5 10*3/uL (ref 4.0–10.5)
nRBC: 0 % (ref 0.0–0.2)

## 2021-09-23 LAB — CMP (CANCER CENTER ONLY)
ALT: 21 U/L (ref 0–44)
AST: 24 U/L (ref 15–41)
Albumin: 4.5 g/dL (ref 3.5–5.0)
Alkaline Phosphatase: 48 U/L (ref 38–126)
Anion gap: 6 (ref 5–15)
BUN: 19 mg/dL (ref 6–20)
CO2: 28 mmol/L (ref 22–32)
Calcium: 9.6 mg/dL (ref 8.9–10.3)
Chloride: 107 mmol/L (ref 98–111)
Creatinine: 0.93 mg/dL (ref 0.61–1.24)
GFR, Estimated: >60 mL/min (ref >60)
Glucose, Bld: 99 mg/dL (ref 70–99)
Potassium: 4.3 mmol/L (ref 3.5–5.1)
Sodium: 141 mmol/L (ref 135–145)
Total Bilirubin: 1.8 mg/dL — ABNORMAL HIGH (ref 0.3–1.2)
Total Protein: 7.2 g/dL (ref 6.5–8.1)

## 2021-09-23 MED ORDER — OYSTER SHELL CALCIUM W/D 500-5 MG-MCG PO TABS
1.0000 | ORAL_TABLET | Freq: Two times a day (BID) | ORAL | 0 refills | Status: DC
  Filled 2021-09-23: qty 60, 30d supply, fill #0

## 2021-09-23 MED ORDER — TECHNETIUM TC 99M MEDRONATE IV KIT
20.0000 | PACK | Freq: Once | INTRAVENOUS | Status: AC | PRN
  Administered 2021-09-23: 20 via INTRAVENOUS

## 2021-09-23 MED ORDER — SODIUM CHLORIDE (PF) 0.9 % IJ SOLN
INTRAMUSCULAR | Status: AC
  Filled 2021-09-23: qty 50

## 2021-09-23 MED ORDER — IOHEXOL 300 MG/ML  SOLN
100.0000 mL | Freq: Once | INTRAMUSCULAR | Status: AC | PRN
  Administered 2021-09-23: 100 mL via INTRAVENOUS

## 2021-09-24 ENCOUNTER — Other Ambulatory Visit (HOSPITAL_COMMUNITY): Payer: Self-pay

## 2021-09-24 LAB — PROSTATE-SPECIFIC AG, SERUM (LABCORP): Prostate Specific Ag, Serum: <0.1 ng/mL (ref 0.0–4.0)

## 2021-09-26 ENCOUNTER — Inpatient Hospital Stay: Payer: BC Managed Care – PPO | Attending: Oncology | Admitting: Oncology

## 2021-09-26 ENCOUNTER — Inpatient Hospital Stay: Payer: BC Managed Care – PPO

## 2021-09-26 ENCOUNTER — Other Ambulatory Visit: Payer: Self-pay

## 2021-09-26 VITALS — BP 126/88 | HR 73 | Temp 97.5°F | Resp 18 | Ht 68.0 in | Wt 155.5 lb

## 2021-09-26 DIAGNOSIS — C7951 Secondary malignant neoplasm of bone: Secondary | ICD-10-CM | POA: Diagnosis present

## 2021-09-26 DIAGNOSIS — C61 Malignant neoplasm of prostate: Secondary | ICD-10-CM | POA: Diagnosis not present

## 2021-09-26 DIAGNOSIS — Z5111 Encounter for antineoplastic chemotherapy: Secondary | ICD-10-CM | POA: Insufficient documentation

## 2021-09-26 DIAGNOSIS — Z79899 Other long term (current) drug therapy: Secondary | ICD-10-CM | POA: Diagnosis not present

## 2021-09-26 MED ORDER — DENOSUMAB 120 MG/1.7ML ~~LOC~~ SOLN
120.0000 mg | Freq: Once | SUBCUTANEOUS | Status: AC
  Administered 2021-09-26: 120 mg via SUBCUTANEOUS
  Filled 2021-09-26: qty 1.7

## 2021-09-26 MED ORDER — LEUPROLIDE ACETATE (3 MONTH) 22.5 MG ~~LOC~~ KIT
22.5000 mg | PACK | Freq: Once | SUBCUTANEOUS | Status: AC
  Administered 2021-09-26: 22.5 mg via SUBCUTANEOUS
  Filled 2021-09-26: qty 22.5

## 2021-09-26 NOTE — Progress Notes (Signed)
Hematology and Oncology Follow Up Visit  David Huffman [id number] February 24, 1961 61 y.o. 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  He was diagnosed with 7 and a PSA of 31 and localized disease in 2008.   Prior Therapy: Status post prostatectomy done in 2008, pathology revealing T2c N0 with 0 out of 12 lymph nodes involved.  The patient continued to have rise in his PSA up to 52 after prostatectomy without any measurable disease.  Lupron was started at that time. 3.     Casodex 50 mg daily added in 05/2011. Therapy discontinued in March 2018 because of progression of disease. 4.     Status post radiation therapy to the pelvic bone completed in June 2019 he received 50 Gy in 10 fractions.   Current therapy:  Eligard 22.5 mg every 3 months.  He will receive injection on September 26, 2021 and repeated in 3 months.  Zytiga 1000 mg daily with prednisone 5 mg daily since May 2018.  Xgeva 120 mg every 3 months started on 06/20/2019.   He is due next injection today.   Interim History:  [name] returns today for repeat evaluation.  Since the last visit, he reports no major changes in his health.  He remains active and continues to attempt activities of daily living without any decline.  He denies any recent hospitalizations or illnesses.  He denies any bone pain or pathological fractures.  His performance status quality of life remained excellent.        Medications: Reviewed without changes. Current Outpatient Medications  Medication Sig Dispense Refill   abiraterone acetate (ZYTIGA) 250 MG tablet TAKE 4 TABLETS (1000MG ) BY MOUTH DAILY ON AN EMPTY STOMACH 1 HOUR BEFORE OR 2 HOURS AFTER A MEAL 120 tablet 2   Ascorbic Acid (VITAMIN C) 1000 MG tablet Take 1,000 mg by mouth daily.     b complex vitamins tablet Take 1 tablet by mouth daily.     Calcium  Carb-Cholecalciferol (OYSTER SHELL CALCIUM W/D) 500-200 MG-UNIT TABS Take 1 tablet by mouth 2 (two) times daily.     Calcium Carb-Cholecalciferol (OYSTER SHELL CALCIUM W/D) 500-200 MG-UNIT TABS TAKE 1 TABLET BY MOUTH TWO TIMES DAILY 60 tablet 3   Calcium Carb-Cholecalciferol (OYSTER SHELL CALCIUM W/D) 500-5 MG-MCG TABS Take 1 tablet by mouth 2 (two) times daily. 60 tablet 0   denosumab (XGEVA) 120 MG/1.7ML SOLN injection Inject 120 mg into the skin once.     leuprolide (LUPRON) 22.5 MG injection Inject 22.5 mg into the muscle every 3 (three) months.     Melatonin 10 MG CAPS Take 12 mg by mouth 2 (two) times daily at 8 am and 10 pm.     Multiple Vitamins-Minerals (MULTIVITAMIN PO) Take 1 tablet by mouth daily.     Omega-3 Fatty Acids (FISH OIL) 1000 MG CAPS Take 1,000 mg by mouth daily.     predniSONE (DELTASONE) 5 MG tablet TAKE 1 TABLET (5 MG TOTAL) BY MOUTH DAILY WITH BREAKFAST. 90 tablet 3   VITAMIN D, ERGOCALCIFEROL, PO Take 1 tablet by mouth daily.     No current facility-administered medications for this visit.    Allergies: No Known Allergies    Physical Exam:       Blood pressure 126/88, pulse 73, temperature (!) 97.5 F (36.4 C), temperature source Temporal, resp. rate 18, height 5\' 8"  (1.727 m), weight 155 lb 8  oz (70.5 kg), SpO2 100 %.         ECOG: 0    General appearance: Comfortable appearing without any discomfort Head: Normocephalic without any trauma Oropharynx: Mucous membranes are moist and pink without any thrush or ulcers. Eyes: Pupils are equal and round reactive to light. Lymph nodes: No cervical, supraclavicular, inguinal or axillary lymphadenopathy.   Heart:regular rate and rhythm.  S1 and S2 without leg edema. Lung: Clear without any rhonchi or wheezes.  No dullness to percussion. Abdomin: Soft, nontender, nondistended with good bowel sounds.  No hepatosplenomegaly. Musculoskeletal: No joint deformity or effusion.  Full range of motion  noted. Neurological: No deficits noted on motor, sensory and deep tendon reflex exam. Skin: No petechial rash or dryness.  Appeared moist.                Lab Results: Lab Results  Component Value Date   WBC 4.5 09/23/2021   HGB 14.6 09/23/2021   HCT 43.3 09/23/2021   MCV 88.2 09/23/2021   PLT 237 09/23/2021     Chemistry      Component Value Date/Time   NA 141 09/23/2021 1007   NA 142 08/18/2017 0754   K 4.3 09/23/2021 1007   K 4.2 08/18/2017 0754   CL 107 09/23/2021 1007   CL 106 01/03/2013 0916   CO2 28 09/23/2021 1007   CO2 25 08/18/2017 0754   BUN 19 09/23/2021 1007   BUN 14.0 08/18/2017 0754   CREATININE 0.93 09/23/2021 1007   CREATININE 1.00 06/03/2020 1047   CREATININE 1.0 08/18/2017 0754      Component Value Date/Time   CALCIUM 9.6 09/23/2021 1007   CALCIUM 9.5 08/18/2017 0754   ALKPHOS 48 09/23/2021 1007   ALKPHOS 106 08/18/2017 0754   AST 24 09/23/2021 1007   AST 31 08/18/2017 0754   ALT 21 09/23/2021 1007   ALT 39 08/18/2017 0754   BILITOT 1.8 (H) 09/23/2021 1007   BILITOT 1.26 (H) 08/18/2017 0754          Latest Reference Range & Units 04/01/21 10:49 06/24/21 07:45 09/23/21 10:07  Prostate Specific Ag, Serum 0.0 - 4.0 ng/mL <0.1 <0.1 <0.1    IMPRESSION: 1. Status post prostatectomy. 2. 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 3. No CT evidence of new osseous or soft tissue metastatic disease in the chest, abdomen, or pelvis. 4. Unchanged sclerotic fractures of the bilateral posterior ribs.  IMPRESSION: There are new foci of increased tracer uptake in the lower lumbar spine and 1 of the right upper ribs. Findings suggest possible skeletal metastatic disease. Please correlate with laboratory findings.   ASSESSMENT AND PLAN:   61 year old man with:  1.  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    His disease  status was updated at this time and treatment choices were reviewed.  He continues to be on Zytiga with PSA undetectable.  Imaging studies pain on September 23, 2021 were personally reviewed and showed no convincing evidence of metastatic disease.  His CT scan showed no visceral metastasis or lymphadenopathy.  He has some uptake in the ribs and lower lumbar spine which could be traumatic in nature.  There are no CT evidence of these lesions at this time.  I recommended continuing the same regimen without any adjustments at this time.  He is agreeable to proceed.   2. Androgen depravation: Risks and benefits of continuing Eligard long-term were reviewed.  Complications including weight gain, hot flashes and sexual dysfunction were reiterated.  He is agreeable to proceed.  3. Bone health: He is currently on Xgeva without any issues at this time.  Hypocalcemia and osteonecrosis of the jaw complications were reviewed.  He will proceed today without objections.  4.  Sclerotic lesion in the lumbar spine and the ribs: These appear to be traumatic in nature we will continue to monitor on future imaging studies.  5. Follow-up: He will return in 3 months for a follow-up visit.  30 minutes were spent on this visit.  The time was dedicated to reviewing imaging studies, laboratory data, treatment choices and future plan of care review.  Zola Button, MD 2/3/20239:00 AM

## 2021-09-29 ENCOUNTER — Encounter: Payer: Self-pay | Admitting: Oncology

## 2021-09-29 ENCOUNTER — Other Ambulatory Visit (HOSPITAL_COMMUNITY): Payer: Self-pay

## 2021-10-22 ENCOUNTER — Other Ambulatory Visit (HOSPITAL_COMMUNITY): Payer: Self-pay

## 2021-10-22 ENCOUNTER — Other Ambulatory Visit: Payer: Self-pay | Admitting: Oncology

## 2021-10-22 MED ORDER — OYSTER SHELL CALCIUM W/D 500-5 MG-MCG PO TABS
1.0000 | ORAL_TABLET | Freq: Two times a day (BID) | ORAL | 0 refills | Status: DC
  Filled 2021-10-29: qty 60, 30d supply, fill #0

## 2021-10-29 ENCOUNTER — Other Ambulatory Visit (HOSPITAL_COMMUNITY): Payer: Self-pay

## 2021-10-29 ENCOUNTER — Encounter: Payer: Self-pay | Admitting: Oncology

## 2021-10-30 ENCOUNTER — Other Ambulatory Visit (HOSPITAL_COMMUNITY): Payer: Self-pay

## 2021-11-24 ENCOUNTER — Other Ambulatory Visit: Payer: Self-pay | Admitting: Oncology

## 2021-11-24 ENCOUNTER — Other Ambulatory Visit (HOSPITAL_COMMUNITY): Payer: Self-pay

## 2021-11-24 DIAGNOSIS — C61 Malignant neoplasm of prostate: Secondary | ICD-10-CM

## 2021-11-25 ENCOUNTER — Other Ambulatory Visit (HOSPITAL_COMMUNITY): Payer: Self-pay

## 2021-11-25 ENCOUNTER — Encounter: Payer: Self-pay | Admitting: Oncology

## 2021-11-25 MED ORDER — OYSTER SHELL CALCIUM W/D 500-5 MG-MCG PO TABS
1.0000 | ORAL_TABLET | Freq: Two times a day (BID) | ORAL | 0 refills | Status: DC
  Filled 2021-11-25: qty 60, 30d supply, fill #0

## 2021-11-25 MED ORDER — ABIRATERONE ACETATE 250 MG PO TABS
ORAL_TABLET | ORAL | 2 refills | Status: DC
  Filled 2021-11-25: qty 120, 30d supply, fill #0
  Filled 2021-12-24: qty 120, 30d supply, fill #1
  Filled 2022-01-22: qty 120, 30d supply, fill #2

## 2021-11-27 ENCOUNTER — Encounter: Payer: Self-pay | Admitting: Oncology

## 2021-11-27 ENCOUNTER — Other Ambulatory Visit (HOSPITAL_COMMUNITY): Payer: Self-pay

## 2021-12-05 ENCOUNTER — Telehealth: Payer: Self-pay | Admitting: Oncology

## 2021-12-05 NOTE — Telephone Encounter (Signed)
Called patient regarding upcoming appointment, patient has been called and voicemail was left. ?

## 2021-12-22 ENCOUNTER — Inpatient Hospital Stay: Payer: BC Managed Care – PPO | Attending: Oncology

## 2021-12-22 ENCOUNTER — Other Ambulatory Visit (HOSPITAL_COMMUNITY): Payer: Self-pay

## 2021-12-22 ENCOUNTER — Other Ambulatory Visit: Payer: Self-pay

## 2021-12-22 DIAGNOSIS — Z79899 Other long term (current) drug therapy: Secondary | ICD-10-CM | POA: Insufficient documentation

## 2021-12-22 DIAGNOSIS — Z5111 Encounter for antineoplastic chemotherapy: Secondary | ICD-10-CM | POA: Diagnosis present

## 2021-12-22 DIAGNOSIS — C7951 Secondary malignant neoplasm of bone: Secondary | ICD-10-CM | POA: Diagnosis present

## 2021-12-22 DIAGNOSIS — C61 Malignant neoplasm of prostate: Secondary | ICD-10-CM | POA: Diagnosis present

## 2021-12-22 LAB — CMP (CANCER CENTER ONLY)
ALT: 24 U/L (ref 0–44)
AST: 40 U/L (ref 15–41)
Albumin: 4.1 g/dL (ref 3.5–5.0)
Alkaline Phosphatase: 53 U/L (ref 38–126)
Anion gap: 5 (ref 5–15)
BUN: 18 mg/dL (ref 6–20)
CO2: 30 mmol/L (ref 22–32)
Calcium: 9.4 mg/dL (ref 8.9–10.3)
Chloride: 108 mmol/L (ref 98–111)
Creatinine: 0.99 mg/dL (ref 0.61–1.24)
GFR, Estimated: >60 mL/min (ref >60)
Glucose, Bld: 59 mg/dL — ABNORMAL LOW (ref 70–99)
Potassium: 4.1 mmol/L (ref 3.5–5.1)
Sodium: 143 mmol/L (ref 135–145)
Total Bilirubin: 1.3 mg/dL — ABNORMAL HIGH (ref 0.3–1.2)
Total Protein: 6.6 g/dL (ref 6.5–8.1)

## 2021-12-22 LAB — CBC WITH DIFFERENTIAL (CANCER CENTER ONLY)
Abs Immature Granulocytes: 0 10*3/uL (ref 0.00–0.07)
Basophils Absolute: 0 10*3/uL (ref 0.0–0.1)
Basophils Relative: 1 %
Eosinophils Absolute: 0.1 10*3/uL (ref 0.0–0.5)
Eosinophils Relative: 2 %
HCT: 41.5 % (ref 39.0–52.0)
Hemoglobin: 14 g/dL (ref 13.0–17.0)
Immature Granulocytes: 0 %
Lymphocytes Relative: 38 %
Lymphs Abs: 1.6 10*3/uL (ref 0.7–4.0)
MCH: 30.1 pg (ref 26.0–34.0)
MCHC: 33.7 g/dL (ref 30.0–36.0)
MCV: 89.2 fL (ref 80.0–100.0)
Monocytes Absolute: 0.4 10*3/uL (ref 0.1–1.0)
Monocytes Relative: 10 %
Neutro Abs: 2.1 10*3/uL (ref 1.7–7.7)
Neutrophils Relative %: 49 %
Platelet Count: 219 10*3/uL (ref 150–400)
RBC: 4.65 MIL/uL (ref 4.22–5.81)
RDW: 12.6 % (ref 11.5–15.5)
WBC Count: 4.2 10*3/uL (ref 4.0–10.5)
nRBC: 0 % (ref 0.0–0.2)

## 2021-12-23 LAB — PROSTATE-SPECIFIC AG, SERUM (LABCORP): Prostate Specific Ag, Serum: <0.1 ng/mL (ref 0.0–4.0)

## 2021-12-24 ENCOUNTER — Other Ambulatory Visit (HOSPITAL_COMMUNITY): Payer: Self-pay

## 2021-12-25 ENCOUNTER — Telehealth: Payer: Self-pay | Admitting: Oncology

## 2021-12-25 NOTE — Telephone Encounter (Signed)
Called patient regarding upcoming appointment, left a voicemail. 

## 2021-12-26 ENCOUNTER — Other Ambulatory Visit: Payer: Self-pay

## 2021-12-26 ENCOUNTER — Inpatient Hospital Stay: Payer: BC Managed Care – PPO

## 2021-12-26 ENCOUNTER — Inpatient Hospital Stay: Payer: BC Managed Care – PPO | Admitting: Oncology

## 2021-12-26 ENCOUNTER — Other Ambulatory Visit: Payer: BC Managed Care – PPO

## 2021-12-26 VITALS — BP 117/78 | HR 78 | Temp 97.9°F | Resp 16 | Ht 68.0 in | Wt 152.9 lb

## 2021-12-26 DIAGNOSIS — C61 Malignant neoplasm of prostate: Secondary | ICD-10-CM | POA: Diagnosis not present

## 2021-12-26 DIAGNOSIS — Z5111 Encounter for antineoplastic chemotherapy: Secondary | ICD-10-CM | POA: Diagnosis not present

## 2021-12-26 MED ORDER — DENOSUMAB 120 MG/1.7ML ~~LOC~~ SOLN
120.0000 mg | Freq: Once | SUBCUTANEOUS | Status: AC
  Administered 2021-12-26: 120 mg via SUBCUTANEOUS
  Filled 2021-12-26: qty 1.7

## 2021-12-26 MED ORDER — LEUPROLIDE ACETATE (3 MONTH) 22.5 MG ~~LOC~~ KIT
22.5000 mg | PACK | Freq: Once | SUBCUTANEOUS | Status: AC
  Administered 2021-12-26: 22.5 mg via SUBCUTANEOUS
  Filled 2021-12-26: qty 22.5

## 2021-12-26 NOTE — Progress Notes (Signed)
Hematology and Oncology Follow Up Visit ? ?David Huffman ?[id number] ?[birth year] 61 y.o. ?12/26/2021 11:57 AM ? ?CC: [name], MD  ? ? ?Principle Diagnosis: 61 year old man with advanced prostate cancer with disease to the bone diagnosed in 2018.  He has castration-resistant after presenting in 2008 with localized disease, Gleason score 7 and a PSA of 31. ? ? ?Prior Therapy: ?Status post prostatectomy done in 2008, pathology revealing T2c N0 with 0 out of 12 lymph nodes involved.  ?The patient continued to have rise in his PSA up to 52 after prostatectomy without any measurable disease.  Lupron was started at that time. ?3.     Casodex 50 mg daily added in 05/2011. Therapy discontinued in March 2018 because of progression of disease. ?4.     Status post radiation therapy to the pelvic bone completed in June 2019 he received 50 Gy in 10 fractions. ? ? ?Current therapy:  ? ? ?Eligard 22.5 mg every 3 months.  He will receive Eligard today. ? ?Zytiga 1000 mg daily with prednisone 5 mg daily since May 2018. ? ?Xgeva 120 mg every 3 months started on 06/20/2019.   He will receive next injection today and repeated in 3 months. ? ? ?Interim History:  David Huffman is here for a follow-up visit.  Since last visit, he reports no major changes in his health.  He continues to tolerate Zytiga without any complaints.  He denies any nausea, vomiting or excessive fatigue.  He denies any edema or illnesses.  He denies any hospitalizations or weakness.  He continues to be active and exercises regularly and has lost weight related to his exercise. ? ? ? ? ? ? ? ?Medications: Updated on review. ?Current Outpatient Medications  ?Medication Sig Dispense Refill  ? abiraterone acetate (ZYTIGA) 250 MG tablet TAKE 4 TABLETS ('1000MG'$ ) BY MOUTH DAILY ON AN EMPTY STOMACH 1 HOUR BEFORE OR 2 HOURS AFTER A MEAL 120 tablet 2  ? Ascorbic Acid (VITAMIN C) 1000 MG tablet Take 1,000 mg by mouth daily.    ? b complex vitamins tablet Take 1 tablet by mouth daily.     ? Calcium Carb-Cholecalciferol (OYSTER SHELL CALCIUM W/D) 500-200 MG-UNIT TABS Take 1 tablet by mouth 2 (two) times daily.    ? Calcium Carb-Cholecalciferol (OYSTER SHELL CALCIUM W/D) 500-200 MG-UNIT TABS TAKE 1 TABLET BY MOUTH TWO TIMES DAILY 60 tablet 3  ? Calcium Carb-Cholecalciferol (OYSTER SHELL CALCIUM W/D) 500-5 MG-MCG TABS Take 1 tablet by mouth 2 (two) times daily. 60 tablet 0  ? denosumab (XGEVA) 120 MG/1.7ML SOLN injection Inject 120 mg into the skin once.    ? leuprolide (LUPRON) 22.5 MG injection Inject 22.5 mg into the muscle every 3 (three) months.    ? Melatonin 10 MG CAPS Take 12 mg by mouth 2 (two) times daily at 8 am and 10 pm.    ? Multiple Vitamins-Minerals (MULTIVITAMIN PO) Take 1 tablet by mouth daily.    ? Omega-3 Fatty Acids (FISH OIL) 1000 MG CAPS Take 1,000 mg by mouth daily.    ? predniSONE (DELTASONE) 5 MG tablet TAKE 1 TABLET (5 MG TOTAL) BY MOUTH DAILY WITH BREAKFAST. 90 tablet 3  ? VITAMIN D, ERGOCALCIFEROL, PO Take 1 tablet by mouth daily.    ? ?No current facility-administered medications for this visit.  ? ? ?Allergies: No Known Allergies ? ? ? ?Physical Exam: ? ? ? ? ? ? ? ?Blood pressure 117/78, pulse 78, temperature 97.9 ?F (36.6 ?C), temperature source Temporal, resp. rate  16, height '5\' 8"'$  (1.727 m), weight 152 lb 14.4 oz (69.4 kg), SpO2 100 %. ? ? ? ? ? ? ? ? ?ECOG: 0 ? ? ?General appearance: Alert, awake without any distress. ?Head: Atraumatic without abnormalities ?Oropharynx: Without any thrush or ulcers. ?Eyes: No scleral icterus. ?Lymph nodes: No lymphadenopathy noted in the cervical, supraclavicular, or axillary nodes ?Heart:regular rate and rhythm, without any murmurs or gallops.   ?Lung: Clear to auscultation without any rhonchi, wheezes or dullness to percussion. ?Abdomin: Soft, nontender without any shifting dullness or ascites. ?Musculoskeletal: No clubbing or cyanosis. ?Neurological: No motor or sensory deficits. ?Skin: No rashes or  lesions. ? ? ? ? ? ? ? ? ? ? ? ? ? ? ?Lab Results: ?Lab Results  ?Component Value Date  ? WBC 4.2 12/22/2021  ? HGB 14.0 12/22/2021  ? HCT 41.5 12/22/2021  ? MCV 89.2 12/22/2021  ? PLT 219 12/22/2021  ? ?  Chemistry   ?   ?Component Value Date/Time  ? NA 143 12/22/2021 0933  ? NA 142 08/18/2017 0754  ? K 4.1 12/22/2021 0933  ? K 4.2 08/18/2017 0754  ? CL 108 12/22/2021 0933  ? CL 106 01/03/2013 0916  ? CO2 30 12/22/2021 0933  ? CO2 25 08/18/2017 0754  ? BUN 18 12/22/2021 0933  ? BUN 14.0 08/18/2017 0754  ? CREATININE 0.99 12/22/2021 0933  ? CREATININE 1.00 06/03/2020 1047  ? CREATININE 1.0 08/18/2017 0754  ?    ?Component Value Date/Time  ? CALCIUM 9.4 12/22/2021 0933  ? CALCIUM 9.5 08/18/2017 0754  ? ALKPHOS 53 12/22/2021 0933  ? ALKPHOS 106 08/18/2017 0754  ? AST 40 12/22/2021 0933  ? AST 31 08/18/2017 0754  ? ALT 24 12/22/2021 0933  ? ALT 39 08/18/2017 0754  ? BILITOT 1.3 (H) 12/22/2021 0933  ? BILITOT 1.26 (H) 08/18/2017 0754  ?  ? ? ? ? ? Latest Reference Range & Units 09/23/21 10:07 12/22/21 09:33  ?Prostate Specific Ag, Serum 0.0 - 4.0 ng/mL <0.1 <0.1  ? ? ? ? ?ASSESSMENT AND PLAN:  ? ?61 year old man with: ? ?1.  Advanced prostate cancer with disease to the bone diagnosed in 2018.  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 ? ?He is currently on Zytiga with PSA remains undetectable.  Risks and benefits of continuing this treatment were discussed at this time.  Potential complications that include hypertension, edema and adrenal insufficiency were reviewed.  Alternative treatment options including systemic chemotherapy will be deferred at this time.  He is agreeable to continue at this time. ? ? ?2. Androgen depravation: He will receive Eligard today and repeated in 3 months.  Complication clinic weight gain, hot flashes among others were reiterated. ? ?3. Bone health: He is due for Xgeva today.  Complications including hypocalcemia and osteonecrosis of the jaw were discussed.  He is agreeable to  proceed. ? ?4.  Sclerotic lesion in the lumbar spine and the ribs: His PSA continues to be undetectable and unlikely dealing with metastatic lesions. ? ?5. Follow-up: In 3 months for a follow-up visit. ? ?30 minutes were spent on this encounter.  The time was dedicated to reviewing laboratory data, disease status update and outlining future plan of care review. ? ?Zola Button, MD ?5/5/202311:57 AM ?

## 2021-12-29 ENCOUNTER — Other Ambulatory Visit (HOSPITAL_COMMUNITY): Payer: Self-pay

## 2022-01-06 ENCOUNTER — Telehealth: Payer: Self-pay

## 2022-01-06 NOTE — Telephone Encounter (Signed)
Oral Oncology Patient Advocate Encounter ?  ?Received notification from [facility] that prior authorization for David Huffman is required. ?  ?PA submitted by fax [phone] ?Key [id number] ?Status is pending ?  ?Oral Oncology Clinic will continue to follow. ? ? ?Wynn Maudlin CPHT ?Specialty Pharmacy Patient Advocate ?[place] ?Phone [phone] ?Fax [phone] ?01/06/2022 3:24 PM ? ?

## 2022-01-07 ENCOUNTER — Other Ambulatory Visit (HOSPITAL_COMMUNITY): Payer: Self-pay

## 2022-01-07 NOTE — Telephone Encounter (Signed)
Oral Oncology Patient Advocate Encounter ? ?Prior Authorization for Fabio Asa has been approved.   ? ?PA# [id number] ?Effective dates: 01/07/22 through 01/08/23 ? ?Patient must use [facility] ? ?Oral Oncology Clinic will continue to follow.  ? ?Wynn Maudlin CPHT ?Specialty Pharmacy Patient Advocate ?[place] ?Phone [phone] ?Fax [phone] ?01/07/2022 3:24 PM ? ?

## 2022-01-22 ENCOUNTER — Other Ambulatory Visit (HOSPITAL_COMMUNITY): Payer: Self-pay

## 2022-01-22 ENCOUNTER — Other Ambulatory Visit: Payer: Self-pay | Admitting: Oncology

## 2022-01-22 ENCOUNTER — Encounter: Payer: Self-pay | Admitting: Oncology

## 2022-01-22 MED ORDER — OYSTER SHELL CALCIUM W/D 500-5 MG-MCG PO TABS
1.0000 | ORAL_TABLET | Freq: Two times a day (BID) | ORAL | 0 refills | Status: DC
  Filled 2022-01-22: qty 60, 30d supply, fill #0

## 2022-01-22 MED ORDER — PREDNISONE 5 MG PO TABS
ORAL_TABLET | Freq: Every day | ORAL | 3 refills | Status: DC
Start: 2022-01-22 — End: 2022-11-05
  Filled 2022-01-22: qty 30, 30d supply, fill #0
  Filled 2022-02-23: qty 30, 30d supply, fill #1
  Filled 2022-03-26: qty 30, 30d supply, fill #2
  Filled 2022-04-24: qty 30, 30d supply, fill #3
  Filled 2022-06-23: qty 30, 30d supply, fill #4
  Filled 2022-07-24: qty 30, 30d supply, fill #5
  Filled 2022-08-21: qty 30, 30d supply, fill #6
  Filled 2022-09-16: qty 30, 30d supply, fill #7
  Filled 2022-10-21: qty 30, 30d supply, fill #8

## 2022-01-23 ENCOUNTER — Other Ambulatory Visit (HOSPITAL_COMMUNITY): Payer: Self-pay

## 2022-02-17 ENCOUNTER — Other Ambulatory Visit (HOSPITAL_COMMUNITY): Payer: Self-pay

## 2022-02-17 ENCOUNTER — Telehealth: Payer: Self-pay | Admitting: Oncology

## 2022-02-17 NOTE — Telephone Encounter (Signed)
Called patient regarding upcoming August appointments, patient has been called and notified. 

## 2022-02-23 ENCOUNTER — Other Ambulatory Visit: Payer: Self-pay | Admitting: Oncology

## 2022-02-23 ENCOUNTER — Other Ambulatory Visit (HOSPITAL_COMMUNITY): Payer: Self-pay

## 2022-02-23 DIAGNOSIS — C61 Malignant neoplasm of prostate: Secondary | ICD-10-CM

## 2022-02-23 MED ORDER — OYSTER SHELL CALCIUM W/D 500-5 MG-MCG PO TABS
1.0000 | ORAL_TABLET | Freq: Two times a day (BID) | ORAL | 0 refills | Status: DC
  Filled 2022-02-23: qty 60, 30d supply, fill #0

## 2022-02-23 MED ORDER — ABIRATERONE ACETATE 250 MG PO TABS
ORAL_TABLET | ORAL | 2 refills | Status: DC
  Filled 2022-02-23: qty 120, 30d supply, fill #0
  Filled 2022-03-26: qty 120, 30d supply, fill #1
  Filled 2022-04-24: qty 120, 30d supply, fill #2

## 2022-02-26 ENCOUNTER — Other Ambulatory Visit (HOSPITAL_COMMUNITY): Payer: Self-pay

## 2022-02-26 ENCOUNTER — Encounter: Payer: Self-pay | Admitting: Oncology

## 2022-03-26 ENCOUNTER — Other Ambulatory Visit: Payer: BC Managed Care – PPO

## 2022-03-26 ENCOUNTER — Other Ambulatory Visit: Payer: Self-pay | Admitting: Oncology

## 2022-03-26 ENCOUNTER — Other Ambulatory Visit (HOSPITAL_COMMUNITY): Payer: Self-pay

## 2022-03-26 MED ORDER — OYSTER SHELL CALCIUM W/D 500-5 MG-MCG PO TABS
1.0000 | ORAL_TABLET | Freq: Two times a day (BID) | ORAL | 0 refills | Status: DC
  Filled 2022-03-26: qty 60, 30d supply, fill #0

## 2022-03-30 ENCOUNTER — Other Ambulatory Visit: Payer: Self-pay

## 2022-03-30 ENCOUNTER — Inpatient Hospital Stay: Payer: BC Managed Care – PPO | Attending: Oncology

## 2022-03-30 DIAGNOSIS — C7951 Secondary malignant neoplasm of bone: Secondary | ICD-10-CM | POA: Insufficient documentation

## 2022-03-30 DIAGNOSIS — C61 Malignant neoplasm of prostate: Secondary | ICD-10-CM | POA: Insufficient documentation

## 2022-03-30 DIAGNOSIS — Z79899 Other long term (current) drug therapy: Secondary | ICD-10-CM | POA: Diagnosis not present

## 2022-03-30 DIAGNOSIS — Z5111 Encounter for antineoplastic chemotherapy: Secondary | ICD-10-CM | POA: Diagnosis present

## 2022-03-30 LAB — CMP (CANCER CENTER ONLY)
ALT: 20 U/L (ref 0–44)
AST: 23 U/L (ref 15–41)
Albumin: 4.2 g/dL (ref 3.5–5.0)
Alkaline Phosphatase: 61 U/L (ref 38–126)
Anion gap: 5 (ref 5–15)
BUN: 21 mg/dL — ABNORMAL HIGH (ref 6–20)
CO2: 26 mmol/L (ref 22–32)
Calcium: 9 mg/dL (ref 8.9–10.3)
Chloride: 109 mmol/L (ref 98–111)
Creatinine: 0.9 mg/dL (ref 0.61–1.24)
GFR, Estimated: >60 mL/min (ref >60)
Glucose, Bld: 96 mg/dL (ref 70–99)
Potassium: 4.7 mmol/L (ref 3.5–5.1)
Sodium: 140 mmol/L (ref 135–145)
Total Bilirubin: 1.4 mg/dL — ABNORMAL HIGH (ref 0.3–1.2)
Total Protein: 7 g/dL (ref 6.5–8.1)

## 2022-03-30 LAB — CBC WITH DIFFERENTIAL (CANCER CENTER ONLY)
Abs Immature Granulocytes: 0.03 10*3/uL (ref 0.00–0.07)
Basophils Absolute: 0 10*3/uL (ref 0.0–0.1)
Basophils Relative: 1 %
Eosinophils Absolute: 0.2 10*3/uL (ref 0.0–0.5)
Eosinophils Relative: 3 %
HCT: 41.1 % (ref 39.0–52.0)
Hemoglobin: 14.7 g/dL (ref 13.0–17.0)
Immature Granulocytes: 1 %
Lymphocytes Relative: 35 %
Lymphs Abs: 1.8 10*3/uL (ref 0.7–4.0)
MCH: 31.5 pg (ref 26.0–34.0)
MCHC: 35.8 g/dL (ref 30.0–36.0)
MCV: 88.2 fL (ref 80.0–100.0)
Monocytes Absolute: 0.5 10*3/uL (ref 0.1–1.0)
Monocytes Relative: 10 %
Neutro Abs: 2.7 10*3/uL (ref 1.7–7.7)
Neutrophils Relative %: 50 %
Platelet Count: 215 10*3/uL (ref 150–400)
RBC: 4.66 MIL/uL (ref 4.22–5.81)
RDW: 12.8 % (ref 11.5–15.5)
WBC Count: 5.2 10*3/uL (ref 4.0–10.5)
nRBC: 0 % (ref 0.0–0.2)

## 2022-04-01 LAB — PROSTATE-SPECIFIC AG, SERUM (LABCORP): Prostate Specific Ag, Serum: <0.1 ng/mL (ref 0.0–4.0)

## 2022-04-02 ENCOUNTER — Inpatient Hospital Stay: Payer: BC Managed Care – PPO

## 2022-04-02 ENCOUNTER — Inpatient Hospital Stay: Payer: BC Managed Care – PPO | Admitting: Oncology

## 2022-04-02 ENCOUNTER — Other Ambulatory Visit: Payer: Self-pay

## 2022-04-02 VITALS — BP 124/95 | HR 70 | Temp 97.3°F | Resp 16 | Wt 156.3 lb

## 2022-04-02 DIAGNOSIS — Z5111 Encounter for antineoplastic chemotherapy: Secondary | ICD-10-CM | POA: Diagnosis not present

## 2022-04-02 DIAGNOSIS — C61 Malignant neoplasm of prostate: Secondary | ICD-10-CM | POA: Diagnosis not present

## 2022-04-02 MED ORDER — LEUPROLIDE ACETATE (3 MONTH) 22.5 MG ~~LOC~~ KIT
22.5000 mg | PACK | Freq: Once | SUBCUTANEOUS | Status: AC
  Administered 2022-04-02: 22.5 mg via SUBCUTANEOUS
  Filled 2022-04-02: qty 22.5

## 2022-04-02 MED ORDER — DENOSUMAB 120 MG/1.7ML ~~LOC~~ SOLN
120.0000 mg | Freq: Once | SUBCUTANEOUS | Status: AC
  Administered 2022-04-02: 120 mg via SUBCUTANEOUS
  Filled 2022-04-02: qty 1.7

## 2022-04-02 NOTE — Addendum Note (Signed)
Addended by: Wyatt Portela on: 04/02/2022 10:19 AM   Modules accepted: Orders

## 2022-04-02 NOTE — Progress Notes (Signed)
Hematology and Oncology Follow Up Visit  David Huffman [id number] 1961-06-06 61 y.o. 04/02/2022 8:24 AM  CC: David Messick, MD    Principle Diagnosis: 61 year old man with castration-resistant advanced prostate cancer with disease to the bone diagnosed in 2018.  He was found to have Gleason score 7 and a PSA of 31 in 2008.   Prior Therapy: Status post prostatectomy done in 2008, pathology revealing T2c N0 with 0 out of 12 lymph nodes involved.  The patient continued to have rise in his PSA up to 52 after prostatectomy without any measurable disease.  Lupron was started at that time. 3.     Casodex 50 mg daily added in 05/2011. Therapy discontinued in March 2018 because of progression of disease. 4.     Status post radiation therapy to the pelvic bone completed in June 2019 he received 50 Gy in 10 fractions.   Current therapy:    Eligard 22.5 mg every 3 months.  Next injection will be given today.  Zytiga 1000 mg daily with prednisone 5 mg daily since May 2018.  Xgeva 120 mg every 3 months started on 06/20/2019.   He will receive the next injection today.   Interim History:  David Huffman returns today for a follow-up.  Since last visit, he reports no major changes in his health.  He continues to tolerate Zytiga without any complaints.  He denies any nausea, fatigue or abdominal pain.  He continues to workout regularly for work related duties.  He denies any worsening bone pain or pathological fractures.  Performance status quality of life remains unchanged.       Medications: Reviewed without changes. Current Outpatient Medications  Medication Sig Dispense Refill   abiraterone acetate (ZYTIGA) 250 MG tablet TAKE 4 TABLETS ('1000MG'$ ) BY MOUTH DAILY ON AN EMPTY STOMACH 1 HOUR BEFORE OR 2 HOURS AFTER A MEAL 120 tablet 2   Ascorbic Acid (VITAMIN C) 1000 MG tablet Take 1,000 mg by mouth daily.     b complex vitamins tablet Take 1 tablet by mouth daily.     Calcium Carb-Cholecalciferol  (OYSTER SHELL CALCIUM W/D) 500-200 MG-UNIT TABS Take 1 tablet by mouth 2 (two) times daily.     Calcium Carb-Cholecalciferol (OYSTER SHELL CALCIUM W/D) 500-200 MG-UNIT TABS TAKE 1 TABLET BY MOUTH TWO TIMES DAILY 60 tablet 3   Calcium Carb-Cholecalciferol (OYSTER SHELL CALCIUM W/D) 500-5 MG-MCG TABS Take 1 tablet by mouth 2 (two) times daily. 60 tablet 0   denosumab (XGEVA) 120 MG/1.7ML SOLN injection Inject 120 mg into the skin once.     leuprolide (LUPRON) 22.5 MG injection Inject 22.5 mg into the muscle every 3 (three) months.     Melatonin 10 MG CAPS Take 12 mg by mouth 2 (two) times daily at 8 am and 10 pm.     Multiple Vitamins-Minerals (MULTIVITAMIN PO) Take 1 tablet by mouth daily.     Omega-3 Fatty Acids (FISH OIL) 1000 MG CAPS Take 1,000 mg by mouth daily.     predniSONE (DELTASONE) 5 MG tablet TAKE 1 TABLET (5 MG TOTAL) BY MOUTH DAILY WITH BREAKFAST. 90 tablet 3   VITAMIN D, ERGOCALCIFEROL, PO Take 1 tablet by mouth daily.     No current facility-administered medications for this visit.    Allergies: No Known Allergies    Physical Exam:   Blood pressure (!) 124/95, pulse 70, temperature (!) 97.3 F (36.3 C), temperature source Temporal, resp. rate 16, weight 156 lb 4.8 oz (70.9 kg), SpO2 100 %.  ECOG: 0    General appearance: Comfortable appearing without any discomfort Head: Normocephalic without any trauma Oropharynx: Mucous membranes are moist and pink without any thrush or ulcers. Eyes: Pupils are equal and round reactive to light. Lymph nodes: No cervical, supraclavicular, inguinal or axillary lymphadenopathy.   Heart:regular rate and rhythm.  S1 and S2 without leg edema. Lung: Clear without any rhonchi or wheezes.  No dullness to percussion. Abdomin: Soft, nontender, nondistended with good bowel sounds.  No hepatosplenomegaly. Musculoskeletal: No joint deformity or effusion.  Full range of motion noted. Neurological: No deficits noted on motor, sensory and  deep tendon reflex exam. Skin: No petechial rash or dryness.  Appeared moist.                 Lab Results: Lab Results  Component Value Date   WBC 5.2 03/30/2022   HGB 14.7 03/30/2022   HCT 41.1 03/30/2022   MCV 88.2 03/30/2022   PLT 215 03/30/2022     Chemistry      Component Value Date/Time   NA 140 03/30/2022 0818   NA 142 08/18/2017 0754   K 4.7 03/30/2022 0818   K 4.2 08/18/2017 0754   CL 109 03/30/2022 0818   CL 106 01/03/2013 0916   CO2 26 03/30/2022 0818   CO2 25 08/18/2017 0754   BUN 21 (H) 03/30/2022 0818   BUN 14.0 08/18/2017 0754   CREATININE 0.90 03/30/2022 0818   CREATININE 1.00 06/03/2020 1047   CREATININE 1.0 08/18/2017 0754      Component Value Date/Time   CALCIUM 9.0 03/30/2022 0818   CALCIUM 9.5 08/18/2017 0754   ALKPHOS 61 03/30/2022 0818   ALKPHOS 106 08/18/2017 0754   AST 23 03/30/2022 0818   AST 31 08/18/2017 0754   ALT 20 03/30/2022 0818   ALT 39 08/18/2017 0754   BILITOT 1.4 (H) 03/30/2022 0818   BILITOT 1.26 (H) 08/18/2017 0754        Latest Reference Range & Units 09/23/21 10:07 12/22/21 09:33 03/30/22 08:18  Prostate Specific Ag, Serum 0.0 - 4.0 ng/mL <0.1 <0.1 <0.1      ASSESSMENT AND PLAN:   61 year old man with:  1.  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    The natural course of this disease was reviewed at this time and treatment choices were discussed.  He continues to tolerate Zytiga without any complications with excellent PSA response.  Risks and benefits of long-term treatment including hypertension, adrenal sufficiency among others were reviewed.  Alternative treatment options including Taxotere chemotherapy, Pluvicto, PARP inhibitor among others were reviewed.  At this time I recommended continuing Zytiga given his excellent response.  We will staging with a PSMA PET scan if his PSA starts to rise.   2. Androgen depravation: Risks and benefits of  continuing Eligard were discussed at this time.  Complications that include weight gain and sexual dysfunction were discussed.  He will receive that today.  3. Bone health: Complications side effects even including osteonecrosis of the jaw and hypocalcemia were reviewed.  Will proceed today.  4.  Sclerotic lesion in the lumbar spine and the ribs: PSA continues to be undetectable makes these lesions less likely to be metastatic disease however we will update staging scans in the next 6 months with PSMA PET.  5. Follow-up: He will return in 3 months for a follow-up.  30 minutes were dedicated to this visit.  The time was spent on reviewing laboratory data, disease status  update and outlining future plan of care complications related to therapy.  Zola Button, MD 8/10/20238:24 AM

## 2022-04-03 ENCOUNTER — Telehealth: Payer: Self-pay | Admitting: Oncology

## 2022-04-03 NOTE — Telephone Encounter (Signed)
Scheduled per 8/10 los, message has been left

## 2022-04-21 ENCOUNTER — Other Ambulatory Visit (HOSPITAL_COMMUNITY): Payer: Self-pay

## 2022-04-22 ENCOUNTER — Other Ambulatory Visit (HOSPITAL_COMMUNITY): Payer: Self-pay

## 2022-04-24 ENCOUNTER — Other Ambulatory Visit (HOSPITAL_COMMUNITY): Payer: Self-pay

## 2022-04-28 ENCOUNTER — Other Ambulatory Visit (HOSPITAL_COMMUNITY): Payer: Self-pay

## 2022-05-18 ENCOUNTER — Encounter: Payer: Self-pay | Admitting: *Deleted

## 2022-05-20 ENCOUNTER — Other Ambulatory Visit: Payer: Self-pay | Admitting: *Deleted

## 2022-05-20 ENCOUNTER — Other Ambulatory Visit: Payer: Self-pay | Admitting: Oncology

## 2022-05-20 ENCOUNTER — Other Ambulatory Visit (HOSPITAL_COMMUNITY): Payer: Self-pay

## 2022-05-20 DIAGNOSIS — C61 Malignant neoplasm of prostate: Secondary | ICD-10-CM

## 2022-05-20 MED ORDER — OYSTER SHELL CALCIUM W/D 500-5 MG-MCG PO TABS
1.0000 | ORAL_TABLET | Freq: Two times a day (BID) | ORAL | 0 refills | Status: DC
  Filled 2022-05-20: qty 60, 30d supply, fill #0

## 2022-05-20 MED ORDER — ABIRATERONE ACETATE 250 MG PO TABS
ORAL_TABLET | ORAL | 2 refills | Status: DC
  Filled 2022-05-20: qty 120, 30d supply, fill #0
  Filled 2022-06-23: qty 120, 30d supply, fill #1
  Filled 2022-07-24: qty 120, 30d supply, fill #2

## 2022-05-28 ENCOUNTER — Other Ambulatory Visit (HOSPITAL_COMMUNITY): Payer: Self-pay

## 2022-06-17 ENCOUNTER — Other Ambulatory Visit (HOSPITAL_COMMUNITY): Payer: Self-pay

## 2022-06-23 ENCOUNTER — Other Ambulatory Visit (HOSPITAL_COMMUNITY): Payer: Self-pay

## 2022-06-23 ENCOUNTER — Other Ambulatory Visit: Payer: Self-pay | Admitting: Oncology

## 2022-06-23 DIAGNOSIS — C61 Malignant neoplasm of prostate: Secondary | ICD-10-CM

## 2022-06-24 ENCOUNTER — Encounter: Payer: Self-pay | Admitting: Oncology

## 2022-06-24 ENCOUNTER — Other Ambulatory Visit (HOSPITAL_COMMUNITY): Payer: Self-pay

## 2022-06-24 MED ORDER — OYSTER SHELL CALCIUM W/D 500-5 MG-MCG PO TABS
1.0000 | ORAL_TABLET | Freq: Two times a day (BID) | ORAL | 0 refills | Status: DC
  Filled 2022-06-24: qty 60, 30d supply, fill #0

## 2022-06-25 ENCOUNTER — Other Ambulatory Visit (HOSPITAL_COMMUNITY): Payer: Self-pay

## 2022-07-02 ENCOUNTER — Inpatient Hospital Stay: Payer: BC Managed Care – PPO | Attending: Oncology

## 2022-07-02 ENCOUNTER — Inpatient Hospital Stay: Payer: BC Managed Care – PPO

## 2022-07-02 ENCOUNTER — Other Ambulatory Visit: Payer: Self-pay

## 2022-07-02 ENCOUNTER — Inpatient Hospital Stay (HOSPITAL_BASED_OUTPATIENT_CLINIC_OR_DEPARTMENT_OTHER): Payer: BC Managed Care – PPO | Admitting: Oncology

## 2022-07-02 VITALS — BP 124/79 | HR 65 | Temp 97.6°F | Resp 16 | Wt 158.5 lb

## 2022-07-02 DIAGNOSIS — Z79899 Other long term (current) drug therapy: Secondary | ICD-10-CM | POA: Diagnosis not present

## 2022-07-02 DIAGNOSIS — Z5111 Encounter for antineoplastic chemotherapy: Secondary | ICD-10-CM | POA: Insufficient documentation

## 2022-07-02 DIAGNOSIS — C7951 Secondary malignant neoplasm of bone: Secondary | ICD-10-CM | POA: Insufficient documentation

## 2022-07-02 DIAGNOSIS — C61 Malignant neoplasm of prostate: Secondary | ICD-10-CM | POA: Insufficient documentation

## 2022-07-02 LAB — CMP (CANCER CENTER ONLY)
ALT: 22 U/L (ref 0–44)
AST: 24 U/L (ref 15–41)
Albumin: 4.3 g/dL (ref 3.5–5.0)
Alkaline Phosphatase: 53 U/L (ref 38–126)
Anion gap: 5 (ref 5–15)
BUN: 21 mg/dL — ABNORMAL HIGH (ref 6–20)
CO2: 29 mmol/L (ref 22–32)
Calcium: 9.5 mg/dL (ref 8.9–10.3)
Chloride: 107 mmol/L (ref 98–111)
Creatinine: 1.04 mg/dL (ref 0.61–1.24)
GFR, Estimated: >60 mL/min (ref >60)
Glucose, Bld: 85 mg/dL (ref 70–99)
Potassium: 4.2 mmol/L (ref 3.5–5.1)
Sodium: 141 mmol/L (ref 135–145)
Total Bilirubin: 1.8 mg/dL — ABNORMAL HIGH (ref 0.3–1.2)
Total Protein: 6.9 g/dL (ref 6.5–8.1)

## 2022-07-02 LAB — CBC WITH DIFFERENTIAL (CANCER CENTER ONLY)
Abs Immature Granulocytes: 0.02 10*3/uL (ref 0.00–0.07)
Basophils Absolute: 0 10*3/uL (ref 0.0–0.1)
Basophils Relative: 1 %
Eosinophils Absolute: 0.1 10*3/uL (ref 0.0–0.5)
Eosinophils Relative: 2 %
HCT: 42.7 % (ref 39.0–52.0)
Hemoglobin: 14.8 g/dL (ref 13.0–17.0)
Immature Granulocytes: 0 %
Lymphocytes Relative: 38 %
Lymphs Abs: 1.9 10*3/uL (ref 0.7–4.0)
MCH: 31.2 pg (ref 26.0–34.0)
MCHC: 34.7 g/dL (ref 30.0–36.0)
MCV: 89.9 fL (ref 80.0–100.0)
Monocytes Absolute: 0.5 10*3/uL (ref 0.1–1.0)
Monocytes Relative: 9 %
Neutro Abs: 2.5 10*3/uL (ref 1.7–7.7)
Neutrophils Relative %: 50 %
Platelet Count: 239 10*3/uL (ref 150–400)
RBC: 4.75 MIL/uL (ref 4.22–5.81)
RDW: 12.1 % (ref 11.5–15.5)
WBC Count: 5 10*3/uL (ref 4.0–10.5)
nRBC: 0 % (ref 0.0–0.2)

## 2022-07-02 MED ORDER — LEUPROLIDE ACETATE (3 MONTH) 22.5 MG ~~LOC~~ KIT
22.5000 mg | PACK | Freq: Once | SUBCUTANEOUS | Status: AC
  Administered 2022-07-02: 22.5 mg via SUBCUTANEOUS
  Filled 2022-07-02: qty 22.5

## 2022-07-02 MED ORDER — DENOSUMAB 120 MG/1.7ML ~~LOC~~ SOLN
120.0000 mg | Freq: Once | SUBCUTANEOUS | Status: AC
  Administered 2022-07-02: 120 mg via SUBCUTANEOUS
  Filled 2022-07-02: qty 1.7

## 2022-07-02 NOTE — Progress Notes (Signed)
Hematology and Oncology Follow Up Visit  David Huffman [id number] 05/01/1961 61 y.o. 07/02/2022 8:12 AM  CC: [name], MD    Principle Diagnosis: 61 year old man with advanced prostate cancer with disease to the bone diagnosed in 2018.  He has castration-resistant after presenting with Gleason score 7 and a PSA of 31 in 2008.   Prior Therapy: Status post prostatectomy done in 2008, pathology revealing T2c N0 with 0 out of 12 lymph nodes involved.  The patient continued to have rise in his PSA up to 52 after prostatectomy without any measurable disease.  Lupron was started at that time. 3.     Casodex 50 mg daily added in 05/2011. Therapy discontinued in March 2018 because of progression of disease. 4.     Status post radiation therapy to the pelvic bone completed in June 2019 he received 50 Gy in 10 fractions.   Current therapy:    Eligard 22.5 mg every 3 months.  He will receive his next injection today and continued indefinitely.  Zytiga 1000 mg daily with prednisone 5 mg daily since May 2018.  Xgeva 120 mg every 3 months started on 06/20/2019.  This will be continued indefinitely.   Interim History:  David Huffman returns today for follow-up.  Since last visit, he reports feeling well without any major complications.  He denies any nausea, vomiting or abdominal pain.  He denies any complications related to Zytiga.  He denies any excessive fatigue or tiredness.       Medications: Updated on review. Current Outpatient Medications  Medication Sig Dispense Refill   abiraterone acetate (ZYTIGA) 250 MG tablet TAKE 4 TABLETS ('1000MG'$ ) BY MOUTH DAILY ON AN EMPTY STOMACH 1 HOUR BEFORE OR 2 HOURS AFTER A MEAL 120 tablet 2   Ascorbic Acid (VITAMIN C) 1000 MG tablet Take 1,000 mg by mouth daily.     b complex vitamins tablet Take 1 tablet by mouth daily.     Calcium Carb-Cholecalciferol (OYSTER SHELL CALCIUM W/D) 500-5 MG-MCG TABS Take 1 tablet by mouth 2 (two) times daily. 60 tablet 0    denosumab (XGEVA) 120 MG/1.7ML SOLN injection Inject 120 mg into the skin once.     leuprolide (LUPRON) 22.5 MG injection Inject 22.5 mg into the muscle every 3 (three) months.     Melatonin 10 MG CAPS Take 12 mg by mouth 2 (two) times daily at 8 am and 10 pm.     Multiple Vitamins-Minerals (MULTIVITAMIN PO) Take 1 tablet by mouth daily.     Omega-3 Fatty Acids (FISH OIL) 1000 MG CAPS Take 1,000 mg by mouth daily.     predniSONE (DELTASONE) 5 MG tablet TAKE 1 TABLET (5 MG TOTAL) BY MOUTH DAILY WITH BREAKFAST. 90 tablet 3   VITAMIN D, ERGOCALCIFEROL, PO Take 1 tablet by mouth daily.     No current facility-administered medications for this visit.    Allergies: No Known Allergies    Physical Exam:   Blood pressure 124/79, pulse 65, temperature 97.6 F (36.4 C), temperature source Oral, resp. rate 16, weight 158 lb 8 oz (71.9 kg), SpO2 100 %.     ECOG: 0   General appearance: Alert, awake without any distress. Head: Atraumatic without abnormalities Oropharynx: Without any thrush or ulcers. Eyes: No scleral icterus. Lymph nodes: No lymphadenopathy noted in the cervical, supraclavicular, or axillary nodes Heart:regular rate and rhythm, without any murmurs or gallops.   Lung: Clear to auscultation without any rhonchi, wheezes or dullness to percussion. Abdomin: Soft, nontender without any  shifting dullness or ascites. Musculoskeletal: No clubbing or cyanosis. Neurological: No motor or sensory deficits. Skin: No rashes or lesions.                Lab Results: Lab Results  Component Value Date   WBC 5.2 03/30/2022   HGB 14.7 03/30/2022   HCT 41.1 03/30/2022   MCV 88.2 03/30/2022   PLT 215 03/30/2022     Chemistry      Component Value Date/Time   NA 140 03/30/2022 0818   NA 142 08/18/2017 0754   K 4.7 03/30/2022 0818   K 4.2 08/18/2017 0754   CL 109 03/30/2022 0818   CL 106 01/03/2013 0916   CO2 26 03/30/2022 0818   CO2 25 08/18/2017 0754   BUN 21 (H)  03/30/2022 0818   BUN 14.0 08/18/2017 0754   CREATININE 0.90 03/30/2022 0818   CREATININE 1.00 06/03/2020 1047   CREATININE 1.0 08/18/2017 0754      Component Value Date/Time   CALCIUM 9.0 03/30/2022 0818   CALCIUM 9.5 08/18/2017 0754   ALKPHOS 61 03/30/2022 0818   ALKPHOS 106 08/18/2017 0754   AST 23 03/30/2022 0818   AST 31 08/18/2017 0754   ALT 20 03/30/2022 0818   ALT 39 08/18/2017 0754   BILITOT 1.4 (H) 03/30/2022 0818   BILITOT 1.26 (H) 08/18/2017 0754        Latest Reference Range & Units 12/22/21 09:33 03/30/22 08:18  Prostate Specific Ag, Serum 0.0 - 4.0 ng/mL <0.1 <0.1     ASSESSMENT AND PLAN:   61 year old man with:  1.  Advanced prostate cancer with disease to the bone diagnosed in 2018.  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  The natural course of his disease was discussed at this time and treatment choices were reviewed.  He continues to tolerate Zytiga reasonably well with PSA continues to be undetectable.  Long-term complications including hypertension and adrenal insufficiency were reiterated.  Different salvage therapy options such as Taxotere chemotherapy PARP inhibitor could be considered if he has relapsed disease.  I will update his staging scan before the next visit.  I recommended obtaining PSMA PET scan in January which she will consider at this time.   2. Androgen depravation: He will receive Eligard today and repeated in 3 months.  Long-term complication occluding sexual dysfunction and hot flashes were reviewed.  3. Bone health: He is currently on Xgeva which she will continue every 3 months.  I recommended continued calcium and vitamin D supplements.  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  4.  Sclerotic lesion in the lumbar spine and the ribs:   5. Follow-up: In 2 months for a follow-up.  30 minutes were spent on this encounter.  Time was dedicated to reviewing laboratory data, disease status update and outlining future  plan of care discussion.  Zola Button, MD 11/9/20238:12 AM

## 2022-07-03 ENCOUNTER — Telehealth: Payer: Self-pay | Admitting: *Deleted

## 2022-07-03 LAB — PROSTATE-SPECIFIC AG, SERUM (LABCORP): Prostate Specific Ag, Serum: <0.1 ng/mL (ref 0.0–4.0)

## 2022-07-03 NOTE — Telephone Encounter (Signed)
-----   Message from Wyatt Portela, MD sent at 07/03/2022  8:48 AM EST ----- Please let him know his PSA is still low

## 2022-07-03 NOTE — Telephone Encounter (Signed)
Notified of message below

## 2022-07-20 ENCOUNTER — Other Ambulatory Visit (HOSPITAL_COMMUNITY): Payer: Self-pay

## 2022-07-22 ENCOUNTER — Other Ambulatory Visit (HOSPITAL_COMMUNITY): Payer: Self-pay

## 2022-07-23 ENCOUNTER — Encounter: Payer: Self-pay | Admitting: Oncology

## 2022-07-23 ENCOUNTER — Other Ambulatory Visit: Payer: Self-pay | Admitting: Oncology

## 2022-07-23 DIAGNOSIS — C61 Malignant neoplasm of prostate: Secondary | ICD-10-CM

## 2022-07-24 ENCOUNTER — Other Ambulatory Visit (HOSPITAL_COMMUNITY): Payer: Self-pay

## 2022-07-24 ENCOUNTER — Other Ambulatory Visit: Payer: Self-pay | Admitting: Oncology

## 2022-07-24 ENCOUNTER — Encounter: Payer: Self-pay | Admitting: Oncology

## 2022-07-24 DIAGNOSIS — C61 Malignant neoplasm of prostate: Secondary | ICD-10-CM

## 2022-07-24 MED ORDER — OYSTER SHELL CALCIUM W/D 500-5 MG-MCG PO TABS
1.0000 | ORAL_TABLET | Freq: Two times a day (BID) | ORAL | 0 refills | Status: DC
  Filled 2022-07-24: qty 60, 30d supply, fill #0

## 2022-07-27 ENCOUNTER — Other Ambulatory Visit (HOSPITAL_COMMUNITY): Payer: Self-pay

## 2022-07-27 LAB — GUARDANT 360

## 2022-08-06 ENCOUNTER — Encounter: Payer: Self-pay | Admitting: *Deleted

## 2022-08-10 ENCOUNTER — Telehealth: Payer: Self-pay | Admitting: Oncology

## 2022-08-10 NOTE — Telephone Encounter (Signed)
Called patient regarding upcoming January appointment, patient is notified. 

## 2022-08-18 ENCOUNTER — Other Ambulatory Visit (HOSPITAL_COMMUNITY): Payer: Self-pay

## 2022-08-21 ENCOUNTER — Other Ambulatory Visit (HOSPITAL_COMMUNITY): Payer: Self-pay

## 2022-08-21 ENCOUNTER — Other Ambulatory Visit: Payer: Self-pay | Admitting: Oncology

## 2022-08-21 ENCOUNTER — Encounter: Payer: Self-pay | Admitting: Oncology

## 2022-08-21 DIAGNOSIS — C61 Malignant neoplasm of prostate: Secondary | ICD-10-CM

## 2022-08-21 MED ORDER — ABIRATERONE ACETATE 250 MG PO TABS
ORAL_TABLET | ORAL | 2 refills | Status: DC
  Filled 2022-08-21: qty 120, 30d supply, fill #0
  Filled 2022-09-16: qty 120, 30d supply, fill #1

## 2022-08-21 MED ORDER — OYSTER SHELL CALCIUM W/D 500-5 MG-MCG PO TABS
1.0000 | ORAL_TABLET | Freq: Two times a day (BID) | ORAL | 0 refills | Status: DC
  Filled 2022-08-21: qty 60, 30d supply, fill #0

## 2022-08-25 ENCOUNTER — Other Ambulatory Visit (HOSPITAL_COMMUNITY): Payer: Self-pay

## 2022-08-25 ENCOUNTER — Other Ambulatory Visit: Payer: Self-pay

## 2022-08-25 ENCOUNTER — Encounter: Payer: Self-pay | Admitting: Oncology

## 2022-08-27 ENCOUNTER — Other Ambulatory Visit: Payer: Self-pay

## 2022-08-28 ENCOUNTER — Encounter (HOSPITAL_COMMUNITY)
Admission: RE | Admit: 2022-08-28 | Discharge: 2022-08-28 | Disposition: A | Discharge status: Home / Self Care | Payer: BC Managed Care – PPO | Source: Ambulatory Visit | Attending: Oncology | Admitting: Oncology

## 2022-08-28 ENCOUNTER — Other Ambulatory Visit (HOSPITAL_COMMUNITY): Payer: Self-pay

## 2022-08-28 ENCOUNTER — Encounter (HOSPITAL_COMMUNITY): Payer: Self-pay

## 2022-08-28 DIAGNOSIS — C61 Malignant neoplasm of prostate: Secondary | ICD-10-CM | POA: Insufficient documentation

## 2022-08-28 MED ORDER — PIFLIFOLASTAT F 18 (PYLARIFY) INJECTION
9.0000 | Freq: Once | INTRAVENOUS | Status: AC
  Administered 2022-08-28: 9 via INTRAVENOUS

## 2022-08-31 ENCOUNTER — Other Ambulatory Visit (HOSPITAL_COMMUNITY): Payer: Self-pay

## 2022-09-03 ENCOUNTER — Inpatient Hospital Stay: Payer: BC Managed Care – PPO | Attending: Oncology

## 2022-09-03 ENCOUNTER — Inpatient Hospital Stay (HOSPITAL_BASED_OUTPATIENT_CLINIC_OR_DEPARTMENT_OTHER): Payer: BC Managed Care – PPO | Admitting: Oncology

## 2022-09-03 ENCOUNTER — Other Ambulatory Visit: Payer: Self-pay

## 2022-09-03 VITALS — BP 138/91 | HR 74 | Temp 97.3°F | Resp 20 | Wt 160.2 lb

## 2022-09-03 DIAGNOSIS — E291 Testicular hypofunction: Secondary | ICD-10-CM | POA: Insufficient documentation

## 2022-09-03 DIAGNOSIS — C61 Malignant neoplasm of prostate: Secondary | ICD-10-CM | POA: Insufficient documentation

## 2022-09-03 DIAGNOSIS — Z79899 Other long term (current) drug therapy: Secondary | ICD-10-CM | POA: Diagnosis not present

## 2022-09-03 DIAGNOSIS — C7951 Secondary malignant neoplasm of bone: Secondary | ICD-10-CM | POA: Diagnosis present

## 2022-09-03 DIAGNOSIS — Z9079 Acquired absence of other genital organ(s): Secondary | ICD-10-CM | POA: Diagnosis not present

## 2022-09-03 LAB — CBC WITH DIFFERENTIAL (CANCER CENTER ONLY)
Abs Immature Granulocytes: 0.01 10*3/uL (ref 0.00–0.07)
Basophils Absolute: 0 10*3/uL (ref 0.0–0.1)
Basophils Relative: 1 %
Eosinophils Absolute: 0.1 10*3/uL (ref 0.0–0.5)
Eosinophils Relative: 2 %
HCT: 40.6 % (ref 39.0–52.0)
Hemoglobin: 14.9 g/dL (ref 13.0–17.0)
Immature Granulocytes: 0 %
Lymphocytes Relative: 39 %
Lymphs Abs: 2.2 10*3/uL (ref 0.7–4.0)
MCH: 32 pg (ref 26.0–34.0)
MCHC: 36.7 g/dL — ABNORMAL HIGH (ref 30.0–36.0)
MCV: 87.3 fL (ref 80.0–100.0)
Monocytes Absolute: 0.5 10*3/uL (ref 0.1–1.0)
Monocytes Relative: 8 %
Neutro Abs: 2.8 10*3/uL (ref 1.7–7.7)
Neutrophils Relative %: 50 %
Platelet Count: 231 10*3/uL (ref 150–400)
RBC: 4.65 MIL/uL (ref 4.22–5.81)
RDW: 12.3 % (ref 11.5–15.5)
WBC Count: 5.7 10*3/uL (ref 4.0–10.5)
nRBC: 0 % (ref 0.0–0.2)

## 2022-09-03 LAB — CMP (CANCER CENTER ONLY)
ALT: 22 U/L (ref 0–44)
AST: 25 U/L (ref 15–41)
Albumin: 4.3 g/dL (ref 3.5–5.0)
Alkaline Phosphatase: 50 U/L (ref 38–126)
Anion gap: 7 (ref 5–15)
BUN: 17 mg/dL (ref 6–20)
CO2: 27 mmol/L (ref 22–32)
Calcium: 9.4 mg/dL (ref 8.9–10.3)
Chloride: 107 mmol/L (ref 98–111)
Creatinine: 0.85 mg/dL (ref 0.61–1.24)
GFR, Estimated: >60 mL/min (ref >60)
Glucose, Bld: 94 mg/dL (ref 70–99)
Potassium: 4 mmol/L (ref 3.5–5.1)
Sodium: 141 mmol/L (ref 135–145)
Total Bilirubin: 1.5 mg/dL — ABNORMAL HIGH (ref 0.3–1.2)
Total Protein: 6.9 g/dL (ref 6.5–8.1)

## 2022-09-03 NOTE — Progress Notes (Signed)
Hematology and Oncology Follow Up Visit  David Huffman [id number] [birth year] 62 y.o. 09/03/2022 8:08 AM  CC: David Messick, MD    Principle Diagnosis: 62 year old man with castration-resistant advanced prostate cancer with disease to the bone documented in 2018.  He was diagnosed in 2008 after presenting with Gleason score 7 and a PSA of 31 and localized disease.  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  The patient continued to have rise in his PSA up to 52 after prostatectomy without any measurable disease.  Lupron was started at that time. 3.     Casodex 50 mg daily added in 05/2011. Therapy discontinued in March 2018 because of progression of disease. 4.     Status post radiation therapy to the pelvic bone completed in June 2019 he received 50 Gy in 10 fractions.   Current therapy:    Eligard 22.5 mg every 3 months.  Next injection will be in February 2024.  Zytiga 1000 mg daily with prednisone 5 mg daily since May 2018.  Xgeva 120 mg every 3 months started on 06/20/2019.  This will be repeated in February 2024.   Interim History:  Mr. Zawistowski presents today for return evaluation.  Since last visit, he reports no major changes in his health.  He continues to do well without any complaints.  He denies any nausea vomiting or abdominal pain.  He denies any bone pain or pathological fractures.  Continues to be active and attends to activities of daily living.  He exercises regularly including riding bike and [facility].      Medications: Reviewed without changes. Current Outpatient Medications  Medication Sig Dispense Refill   abiraterone acetate (ZYTIGA) 250 MG tablet TAKE 4 TABLETS ('1000MG'$ ) BY MOUTH DAILY ON AN EMPTY STOMACH 1 HOUR BEFORE OR 2 HOURS AFTER A MEAL 120 tablet 2   Ascorbic Acid (VITAMIN C) 1000 MG tablet Take 1,000 mg by mouth daily.     b complex  vitamins tablet Take 1 tablet by mouth daily.     Calcium Carb-Cholecalciferol (OYSTER SHELL CALCIUM W/D) 500-5 MG-MCG TABS Take 1 tablet by mouth 2 (two) times daily. 60 tablet 0   denosumab (XGEVA) 120 MG/1.7ML SOLN injection Inject 120 mg into the skin once.     leuprolide (LUPRON) 22.5 MG injection Inject 22.5 mg into the muscle every 3 (three) months.     Melatonin 10 MG CAPS Take 12 mg by mouth 2 (two) times daily at 8 am and 10 pm.     Multiple Vitamins-Minerals (MULTIVITAMIN PO) Take 1 tablet by mouth daily.     Omega-3 Fatty Acids (FISH OIL) 1000 MG CAPS Take 1,000 mg by mouth daily.     predniSONE (DELTASONE) 5 MG tablet TAKE 1 TABLET (5 MG TOTAL) BY MOUTH DAILY WITH BREAKFAST. 90 tablet 3   VITAMIN D, ERGOCALCIFEROL, PO Take 1 tablet by mouth daily.     No current facility-administered medications for this visit.    Allergies: No Known Allergies    Physical Exam:   Blood pressure (!) 138/91, pulse 74, temperature (!) 97.3 F (36.3 C), resp. rate 20, weight 160 lb 3.2 oz (72.7 kg), SpO2 100 %.      ECOG: 0    General appearance: Comfortable appearing without any discomfort Head: Normocephalic without any trauma Oropharynx: Mucous membranes are moist and pink without any thrush or ulcers. Eyes: Pupils are  equal and round reactive to light. Lymph nodes: No cervical, supraclavicular, inguinal or axillary lymphadenopathy.   Heart:regular rate and rhythm.  S1 and S2 without leg edema. Lung: Clear without any rhonchi or wheezes.  No dullness to percussion. Abdomin: Soft, nontender, nondistended with good bowel sounds.  No hepatosplenomegaly. Musculoskeletal: No joint deformity or effusion.  Full range of motion noted. Neurological: No deficits noted on motor, sensory and deep tendon reflex exam. Skin: No petechial rash or dryness.  Appeared moist.                  Lab Results: Lab Results  Component Value Date   WBC 5.0 07/02/2022   HGB 14.8  07/02/2022   HCT 42.7 07/02/2022   MCV 89.9 07/02/2022   PLT 239 07/02/2022     Chemistry      Component Value Date/Time   NA 141 07/02/2022 0823   NA 142 08/18/2017 0754   K 4.2 07/02/2022 0823   K 4.2 08/18/2017 0754   CL 107 07/02/2022 0823   CL 106 01/03/2013 0916   CO2 29 07/02/2022 0823   CO2 25 08/18/2017 0754   BUN 21 (H) 07/02/2022 0823   BUN 14.0 08/18/2017 0754   CREATININE 1.04 07/02/2022 0823   CREATININE 1.00 06/03/2020 1047   CREATININE 1.0 08/18/2017 0754      Component Value Date/Time   CALCIUM 9.5 07/02/2022 0823   CALCIUM 9.5 08/18/2017 0754   ALKPHOS 53 07/02/2022 0823   ALKPHOS 106 08/18/2017 0754   AST 24 07/02/2022 0823   AST 31 08/18/2017 0754   ALT 22 07/02/2022 0823   ALT 39 08/18/2017 0754   BILITOT 1.8 (H) 07/02/2022 0823   BILITOT 1.26 (H) 08/18/2017 0754      Latest Reference Range & Units 12/22/21 09:33 03/30/22 08:18 07/02/22 08:23  Prostate Specific Ag, Serum 0.0 - 4.0 ng/mL <0.1 <0.1 <0.1      Narrative & Impression  CLINICAL DATA:  Metastatic prostate carcinoma. Restaging. Prior prostatectomy and radiation therapy.   EXAM: NUCLEAR MEDICINE PET SKULL BASE TO THIGH   TECHNIQUE: 9.2 mCi F18 Piflufolastat (Pylarify) was injected intravenously. Full-ring PET imaging was performed from the skull base to thigh after the radiotracer. CT data was obtained and used for attenuation correction and anatomic localization.   COMPARISON:  CT on 09/23/2021   FINDINGS: NECK   No radiotracer activity in neck lymph nodes.   Incidental CT finding: None.   CHEST   No radiotracer accumulation within mediastinal or hilar lymph nodes. No suspicious pulmonary nodules on the CT scan.   Incidental CT finding: None.   ABDOMEN/PELVIS   Prostate: Prior prostatectomy. No focal activity in the prostate bed.   Lymph nodes: No abnormal radiotracer accumulation within pelvic or abdominal nodes.   Liver: No evidence of liver metastasis.    Incidental CT finding: None.   SKELETON   Sclerotic bone metastases involving the right pubis and inferior pubic ramus unchanged in appearance by CT show mild radiotracer uptake, with SUV max of 2.7 and 1.9 respectively. A small focus of radiotracer uptake is also seen in the left inferior pubis SUV max of 2.8, which shows no CT correlate, but is suspicious for bone metastasis.   Old bilateral posterior rib fracture deformities are unchanged in appearance and show no radiotracer uptake.   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   Small focus of radiotracer uptake in  the left inferior pubis without CT correlate, but suspicious for bone metastasis.   No evidence of soft tissue metastatic disease.      ASSESSMENT AND PLAN:   62 year old man with:  1.  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  He presented with localized disease in 2008.  His disease status was updated at this time and treatment choices were reviewed.  His PSA remains undetectable and imaging studies continues to show very little residual disease at this time.  I see no evidence of progressing metastasis.  At this time, I recommended continuing Zytiga with the same dose and schedule and defer any alternative therapy unless she has progression of disease.  Taxotere chemotherapy would be his next option at this time.  At this time, he is agreeable to continue with Zytiga given his excellent response and tolerance.   2. Androgen depravation: Next Eligard will be repeated in February 2024 and repeated every 3 months indefinitely.  3. Bone health: This will be repeated in February 2024 continued for the time being.  4.  Sclerotic lesion in the lumbar spine and the ribs: No evidence of sclerotic lesion noted on his PET scan at this time.  These are likely  traumatic in nature.  5.  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  6.  Genetic considerations: Given his young age at the time of diagnosis his somatic and germline testing did not show any evidence of a BRCA mutation.  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  7. Follow-up: He will return in the next 4 to 6 weeks for repeat evaluation and Eligard and Xgeva injections.  30 minutes were dedicated to this visit.  The time was spent on updating disease status, treatment choices and outlining future plan of care reviewed.  Zola Button, MD 1/11/20248:08 AM

## 2022-09-04 ENCOUNTER — Telehealth: Payer: Self-pay | Admitting: *Deleted

## 2022-09-04 LAB — PROSTATE-SPECIFIC AG, SERUM (LABCORP): Prostate Specific Ag, Serum: <0.1 ng/mL (ref 0.0–4.0)

## 2022-09-04 NOTE — Telephone Encounter (Signed)
LM with note below 

## 2022-09-04 NOTE — Telephone Encounter (Signed)
-----  Message from Wyatt Portela, MD sent at 09/04/2022  9:19 AM EST ----- Please let him know his PSA is low

## 2022-09-10 ENCOUNTER — Other Ambulatory Visit (HOSPITAL_COMMUNITY): Payer: Self-pay

## 2022-09-16 ENCOUNTER — Other Ambulatory Visit (HOSPITAL_COMMUNITY): Payer: Self-pay

## 2022-09-16 ENCOUNTER — Other Ambulatory Visit: Payer: Self-pay

## 2022-09-16 ENCOUNTER — Encounter: Payer: Self-pay | Admitting: Oncology

## 2022-09-16 ENCOUNTER — Other Ambulatory Visit: Payer: Self-pay | Admitting: Hematology

## 2022-09-16 DIAGNOSIS — C61 Malignant neoplasm of prostate: Secondary | ICD-10-CM

## 2022-09-16 MED ORDER — OYSTER SHELL CALCIUM W/D 500-5 MG-MCG PO TABS
1.0000 | ORAL_TABLET | Freq: Two times a day (BID) | ORAL | 0 refills | Status: DC
  Filled 2022-09-16: qty 60, 30d supply, fill #0

## 2022-09-17 ENCOUNTER — Other Ambulatory Visit: Payer: Self-pay

## 2022-09-23 ENCOUNTER — Other Ambulatory Visit (HOSPITAL_COMMUNITY): Payer: Self-pay

## 2022-09-24 ENCOUNTER — Encounter: Payer: Self-pay | Admitting: Oncology

## 2022-10-07 NOTE — Assessment & Plan Note (Signed)
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  Status post prostatectomy, T2cN0 -The patient continued to have rise in his PSA up to 52 after prostatectomy without any measurable disease.  Lupron was started at that time. Casodex 50 mg daily added in 05/2011. Therapy discontinued in March 2018 because of progression of disease. -Status post radiation therapy to the pelvic bone completed in June 2019 he received 50 Gy in 10 fractions. -currently on Eligard 22.5 mg every 3 months, Zytiga 1000 mg daily with prednisone 5 mg daily since May 2018, and Xgeva 120 mg every 3 months.

## 2022-10-07 NOTE — Progress Notes (Unsigned)
[place]   [phone] [phone]   Clinic Follow up Note   Patient Care Team: Vivi Barrack, MD as PCP - General (Family Medicine) Thalia Bloodgood, [place] as Referring Physician (Optometry) Alen Blew, Mathis Dad, MD as Consulting Physician (Oncology)  Date of Service:  10/08/2022  CHIEF COMPLAINT: f/u of Prostate Cancer  CURRENT THERAPY:  Eligard 22.5 mg every 3 months.  He will receive his next injection today and continued indefinitely.   Zytiga 1000 mg daily with prednisone 5 mg daily since May 2018.   Xgeva 120 mg every 3 months started on 06/20/2019.  This will be continued indefinitely.    ASSESSMENT:  David Huffman is a 62 y.o. 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  Status post prostatectomy, T2cN0 -The patient continued to have rise in his PSA up to 52 after prostatectomy without any measurable disease.  Lupron was started at that time. Casodex 50 mg daily added in 05/2011. Therapy discontinued in March 2018 because of progression of disease. -Status post radiation therapy to the pelvic bone completed in June 2019 he received 50 Gy in 10 fractions. -currently on Eligard 22.5 mg every 3 months, Zytiga 1000 mg daily with prednisone 5 mg daily since May 2018, and Xgeva 120 mg every 3 months. -He is clinically doing well, no significant pain or other concerns.  Lab reviewed, his PSA has been undetectable, last pet scan showed minimal disease, he has had excellent response to treatment.    PLAN: -Discussing the PET scan findings and answered pt's questions  -Discuss previous Guardant 360 results -I refill Zytiga, Prednisone -lab reviewed  -proceed with the Eligard /Xgeva injections today and continue every 3 months  -lab, f/u and eligarg /xgeva in 3 months   SUMMARY OF ONCOLOGIC  HISTORY: Oncology History  Malignant neoplasm of prostate ([place])  09/18/2011 Initial Diagnosis   Malignant neoplasm of prostate ([place])   10/05/2020 Genetic Testing   Negative genetic testing on the CancerNext-Expanded+RNAinsight panel.  The CancerNext-Expanded gene panel offered by [facility] and includes sequencing and rearrangement analysis for the following 77 genes: AIP, ALK, APC*, ATM*, AXIN2, BAP1, BARD1, BLM, BMPR1A, BRCA1*, BRCA2*, BRIP1*, CDC73, CDH1*, CDK4, CDKN1B, CDKN2A, CHEK2*, CTNNA1, DICER1, FANCC, FH, FLCN, GALNT12, KIF1B, LZTR1, MAX, MEN1, MET, MLH1*, MSH2*, MSH3, MSH6*, MUTYH*, NBN, NF1*, NF2, NTHL1, PALB2*, PHOX2B, PMS2*, POT1, PRKAR1A, PTCH1, PTEN*, RAD51C*, RAD51D*, RB1, RECQL, RET, SDHA, SDHAF2, SDHB, SDHC, SDHD, SMAD4, SMARCA4, SMARCB1, SMARCE1, STK11, SUFU, TMEM127, TP53*, TSC1, TSC2, VHL and XRCC2 (sequencing and deletion/duplication); EGFR, EGLN1, HOXB13, KIT, MITF, PDGFRA, POLD1, and POLE (sequencing only); EPCAM and GREM1 (deletion/duplication only). DNA and RNA analyses performed for * genes. The report date is October 05, 2020.      INTERVAL HISTORY:  David Huffman is here for a follow up of  Prostate Cancer He was last seen by Dr.Shadad on 07/02/2022 He presents to the clinic accompanied by wife. Pt reports he is doing good. He states he feels good.Pt denies having pain and discomfort.  Pt denies having dental issues. Pt report he is very active. Pt denies having hot flashes. Pt stated he had a genetic testing. Pt state he still have some leakage and he fills like he can't never empty it all out.     All other systems were reviewed with the patient and  are negative.  MEDICAL HISTORY:  Past Medical History:  Diagnosis Date   Family history of breast cancer    Family history of prostate cancer    Prostate cancer ([place]) 08/2006    SURGICAL HISTORY: Past Surgical History:  Procedure Laterality Date   PROSTATE BIOPSY     PROSTATECTOMY     VASECTOMY      I  have reviewed the social history and family history with the patient and they are unchanged from previous note.  ALLERGIES:  has No Known Allergies.  MEDICATIONS:  Current Outpatient Medications  Medication Sig Dispense Refill   abiraterone acetate (ZYTIGA) 250 MG tablet TAKE 4 TABLETS (1000MG) BY MOUTH DAILY ON AN EMPTY STOMACH 1 HOUR BEFORE OR 2 HOURS AFTER A MEAL 120 tablet 2   Ascorbic Acid (VITAMIN C) 1000 MG tablet Take 1,000 mg by mouth daily.     b complex vitamins tablet Take 1 tablet by mouth daily.     Calcium Carb-Cholecalciferol (OYSTER SHELL CALCIUM W/D) 500-5 MG-MCG TABS Take 1 tablet by mouth 2 (two) times daily. 60 tablet 0   denosumab (XGEVA) 120 MG/1.7ML SOLN injection Inject 120 mg into the skin once.     leuprolide (LUPRON) 22.5 MG injection Inject 22.5 mg into the muscle every 3 (three) months.     Melatonin 10 MG CAPS Take 12 mg by mouth 2 (two) times daily at 8 am and 10 pm.     Multiple Vitamins-Minerals (MULTIVITAMIN PO) Take 1 tablet by mouth daily.     Omega-3 Fatty Acids (FISH OIL) 1000 MG CAPS Take 1,000 mg by mouth daily.     predniSONE (DELTASONE) 5 MG tablet TAKE 1 TABLET (5 MG TOTAL) BY MOUTH DAILY WITH BREAKFAST. 90 tablet 3   VITAMIN D, ERGOCALCIFEROL, PO Take 1 tablet by mouth daily.     No current facility-administered medications for this visit.    PHYSICAL EXAMINATION: ECOG PERFORMANCE STATUS: 0 - Asymptomatic  Vitals:   10/08/22 0802  BP: 125/78  Pulse: 64  Resp: 16  Temp: 97.8 F (36.6 C)  SpO2: 100%   Wt Readings from Last 3 Encounters:  10/08/22 158 lb 12.8 oz (72 kg)  09/03/22 160 lb 3.2 oz (72.7 kg)  07/02/22 158 lb 8 oz (71.9 kg)     NECK:(-) supple, thyroid normal size, non-tender, without nodularity LYMPH: (-) no palpable lymphadenopathy in the cervical, axillary  LUNGS: (-) clear to auscultation and percussion with normal breathing effort HEART: (-) regular rate & rhythm and no murmurs and no lower extremity  edema ABDOMEN:(-) abdomen soft, non-tender and normal bowel sounds  LABORATORY DATA:  I have reviewed the data as listed    Latest Ref Rng & Units 10/08/2022    7:37 AM 09/03/2022    8:17 AM 07/02/2022    8:23 AM  CBC  WBC 4.0 - 10.5 K/uL 5.1  5.7  5.0   Hemoglobin 13.0 - 17.0 g/dL 14.6  14.9  14.8   Hematocrit 39.0 - 52.0 % 41.5  40.6  42.7   Platelets 150 - 400 K/uL 229  231  239         Latest Ref Rng & Units 10/08/2022    7:37 AM 09/03/2022    8:17 AM 07/02/2022    8:23 AM  CMP  Glucose 70 - 99 mg/dL 92  94  85   BUN 6 - 20 mg/dL 22  17  21   $ Creatinine 0.61 - 1.24 mg/dL 1.19  0.85  1.04  Sodium 135 - 145 mmol/L 141  141  141   Potassium 3.5 - 5.1 mmol/L 4.1  4.0  4.2   Chloride 98 - 111 mmol/L 106  107  107   CO2 22 - 32 mmol/L 24  27  29   $ Calcium 8.9 - 10.3 mg/dL 9.4  9.4  9.5   Total Protein 6.5 - 8.1 g/dL 7.1  6.9  6.9   Total Bilirubin 0.3 - 1.2 mg/dL 1.5  1.5  1.8   Alkaline Phos 38 - 126 U/L 48  50  53   AST 15 - 41 U/L 26  25  24   $ ALT 0 - 44 U/L 21  22  22       $ RADIOGRAPHIC STUDIES: I have personally reviewed the radiological images as listed and agreed with the findings in the report. No results found.    No orders of the defined types were placed in this encounter.  All questions were answered. The patient knows to call the clinic with any problems, questions or concerns. No barriers to learning was detected. The total time spent in the appointment was 40 minutes.     Truitt Merle, MD 10/08/2022   Felicity Coyer, CMA, am acting as scribe for Truitt Merle, MD.   I have reviewed the above documentation for accuracy and completeness, and I agree with the above.

## 2022-10-08 ENCOUNTER — Other Ambulatory Visit (HOSPITAL_COMMUNITY): Payer: Self-pay

## 2022-10-08 ENCOUNTER — Other Ambulatory Visit: Payer: Self-pay

## 2022-10-08 ENCOUNTER — Inpatient Hospital Stay: Payer: BC Managed Care – PPO

## 2022-10-08 ENCOUNTER — Inpatient Hospital Stay: Payer: BC Managed Care – PPO | Attending: Oncology

## 2022-10-08 ENCOUNTER — Encounter: Payer: Self-pay | Admitting: Hematology

## 2022-10-08 ENCOUNTER — Inpatient Hospital Stay (HOSPITAL_BASED_OUTPATIENT_CLINIC_OR_DEPARTMENT_OTHER): Payer: BC Managed Care – PPO | Admitting: Hematology

## 2022-10-08 VITALS — BP 125/78 | HR 64 | Temp 97.8°F | Resp 16 | Ht 68.0 in | Wt 158.8 lb

## 2022-10-08 DIAGNOSIS — Z79899 Other long term (current) drug therapy: Secondary | ICD-10-CM | POA: Insufficient documentation

## 2022-10-08 DIAGNOSIS — C7951 Secondary malignant neoplasm of bone: Secondary | ICD-10-CM | POA: Diagnosis present

## 2022-10-08 DIAGNOSIS — C61 Malignant neoplasm of prostate: Secondary | ICD-10-CM

## 2022-10-08 DIAGNOSIS — Z5111 Encounter for antineoplastic chemotherapy: Secondary | ICD-10-CM | POA: Diagnosis present

## 2022-10-08 LAB — CMP (CANCER CENTER ONLY)
ALT: 21 U/L (ref 0–44)
AST: 26 U/L (ref 15–41)
Albumin: 4.4 g/dL (ref 3.5–5.0)
Alkaline Phosphatase: 48 U/L (ref 38–126)
Anion gap: 11 (ref 5–15)
BUN: 22 mg/dL — ABNORMAL HIGH (ref 6–20)
CO2: 24 mmol/L (ref 22–32)
Calcium: 9.4 mg/dL (ref 8.9–10.3)
Chloride: 106 mmol/L (ref 98–111)
Creatinine: 1.19 mg/dL (ref 0.61–1.24)
GFR, Estimated: >60 mL/min (ref >60)
Glucose, Bld: 92 mg/dL (ref 70–99)
Potassium: 4.1 mmol/L (ref 3.5–5.1)
Sodium: 141 mmol/L (ref 135–145)
Total Bilirubin: 1.5 mg/dL — ABNORMAL HIGH (ref 0.3–1.2)
Total Protein: 7.1 g/dL (ref 6.5–8.1)

## 2022-10-08 LAB — CBC WITH DIFFERENTIAL (CANCER CENTER ONLY)
Abs Immature Granulocytes: 0.01 10*3/uL (ref 0.00–0.07)
Basophils Absolute: 0 10*3/uL (ref 0.0–0.1)
Basophils Relative: 1 %
Eosinophils Absolute: 0.1 10*3/uL (ref 0.0–0.5)
Eosinophils Relative: 3 %
HCT: 41.5 % (ref 39.0–52.0)
Hemoglobin: 14.6 g/dL (ref 13.0–17.0)
Immature Granulocytes: 0 %
Lymphocytes Relative: 45 %
Lymphs Abs: 2.3 10*3/uL (ref 0.7–4.0)
MCH: 30.8 pg (ref 26.0–34.0)
MCHC: 35.2 g/dL (ref 30.0–36.0)
MCV: 87.6 fL (ref 80.0–100.0)
Monocytes Absolute: 0.5 10*3/uL (ref 0.1–1.0)
Monocytes Relative: 10 %
Neutro Abs: 2.1 10*3/uL (ref 1.7–7.7)
Neutrophils Relative %: 41 %
Platelet Count: 229 10*3/uL (ref 150–400)
RBC: 4.74 MIL/uL (ref 4.22–5.81)
RDW: 12.4 % (ref 11.5–15.5)
WBC Count: 5.1 10*3/uL (ref 4.0–10.5)
nRBC: 0 % (ref 0.0–0.2)

## 2022-10-08 MED ORDER — LEUPROLIDE ACETATE (3 MONTH) 22.5 MG ~~LOC~~ KIT
22.5000 mg | PACK | Freq: Once | SUBCUTANEOUS | Status: AC
  Administered 2022-10-08: 22.5 mg via SUBCUTANEOUS
  Filled 2022-10-08: qty 22.5

## 2022-10-08 MED ORDER — ABIRATERONE ACETATE 250 MG PO TABS
ORAL_TABLET | ORAL | 2 refills | Status: DC
  Filled 2022-10-08: qty 120, fill #0
  Filled 2022-10-21 (×2): qty 120, 30d supply, fill #0
  Filled 2022-11-17: qty 120, 30d supply, fill #1
  Filled 2022-12-18: qty 120, 30d supply, fill #2

## 2022-10-08 MED ORDER — DENOSUMAB 120 MG/1.7ML ~~LOC~~ SOLN
120.0000 mg | Freq: Once | SUBCUTANEOUS | Status: AC
  Administered 2022-10-08: 120 mg via SUBCUTANEOUS
  Filled 2022-10-08: qty 1.7

## 2022-10-10 LAB — PROSTATE-SPECIFIC AG, SERUM (LABCORP): Prostate Specific Ag, Serum: <0.1 ng/mL (ref 0.0–4.0)

## 2022-10-20 ENCOUNTER — Other Ambulatory Visit (HOSPITAL_COMMUNITY): Payer: Self-pay

## 2022-10-21 ENCOUNTER — Other Ambulatory Visit (HOSPITAL_COMMUNITY): Payer: Self-pay

## 2022-10-21 ENCOUNTER — Other Ambulatory Visit: Payer: Self-pay

## 2022-11-05 ENCOUNTER — Other Ambulatory Visit: Payer: Self-pay

## 2022-11-05 ENCOUNTER — Other Ambulatory Visit (HOSPITAL_COMMUNITY): Payer: Self-pay

## 2022-11-05 DIAGNOSIS — C61 Malignant neoplasm of prostate: Secondary | ICD-10-CM

## 2022-11-05 MED ORDER — PREDNISONE 5 MG PO TABS
5.0000 mg | ORAL_TABLET | Freq: Every day | ORAL | 3 refills | Status: DC
  Filled 2022-11-05: qty 90, fill #0
  Filled 2022-11-17: qty 30, 30d supply, fill #0
  Filled 2023-01-12: qty 30, 30d supply, fill #1
  Filled 2023-02-16: qty 30, 30d supply, fill #2
  Filled 2023-03-22: qty 30, 30d supply, fill #3
  Filled 2023-04-23: qty 30, 30d supply, fill #4
  Filled 2023-05-20: qty 30, 30d supply, fill #5
  Filled 2023-06-22: qty 30, 30d supply, fill #6
  Filled 2023-07-14: qty 30, 30d supply, fill #7
  Filled 2023-08-16: qty 30, 30d supply, fill #8
  Filled 2023-09-20: qty 30, 30d supply, fill #9

## 2022-11-17 ENCOUNTER — Other Ambulatory Visit: Payer: Self-pay

## 2022-11-17 ENCOUNTER — Other Ambulatory Visit (HOSPITAL_COMMUNITY): Payer: Self-pay

## 2022-11-17 ENCOUNTER — Other Ambulatory Visit: Payer: Self-pay | Admitting: Hematology

## 2022-11-17 ENCOUNTER — Encounter: Payer: Self-pay | Admitting: Hematology

## 2022-11-17 DIAGNOSIS — C61 Malignant neoplasm of prostate: Secondary | ICD-10-CM

## 2022-11-17 MED ORDER — OYSTER SHELL CALCIUM W/D 500-5 MG-MCG PO TABS
1.0000 | ORAL_TABLET | Freq: Two times a day (BID) | ORAL | 0 refills | Status: DC
  Filled 2022-11-17: qty 60, 30d supply, fill #0

## 2022-11-18 ENCOUNTER — Other Ambulatory Visit: Payer: Self-pay

## 2022-11-20 ENCOUNTER — Other Ambulatory Visit (HOSPITAL_COMMUNITY): Payer: Self-pay

## 2022-11-20 ENCOUNTER — Other Ambulatory Visit: Payer: Self-pay

## 2022-12-09 ENCOUNTER — Telehealth: Payer: Self-pay | Admitting: Pharmacy Technician

## 2022-12-09 ENCOUNTER — Other Ambulatory Visit (HOSPITAL_COMMUNITY): Payer: Self-pay

## 2022-12-09 NOTE — Telephone Encounter (Signed)
Oral Oncology Patient Advocate Encounter  Prior Authorization for Abiraterone has been approved.    PA# [id number] Effective dates: 12/09/22 through 12/09/23  Patient may continue to fill at [facility].    Jinger Neighbors, CPhT-Adv Oncology Pharmacy Patient Advocate [facility] Direct Number: [phone]  Fax: [phone]

## 2022-12-09 NOTE — Telephone Encounter (Signed)
Oral Oncology Patient Advocate Encounter   Received notification that prior authorization for Abiraterone is due for renewal.   PA submitted on 12/09/22 Key [id number] Status is pending     Jinger Neighbors, CPhT-Adv Oncology Pharmacy Patient Advocate [facility] Direct Number: [phone]  Fax: [phone]

## 2022-12-16 ENCOUNTER — Other Ambulatory Visit (HOSPITAL_COMMUNITY): Payer: Self-pay

## 2022-12-18 ENCOUNTER — Other Ambulatory Visit (HOSPITAL_COMMUNITY): Payer: Self-pay

## 2023-01-05 ENCOUNTER — Inpatient Hospital Stay: Payer: BC Managed Care – PPO | Attending: Oncology

## 2023-01-05 ENCOUNTER — Other Ambulatory Visit: Payer: Self-pay

## 2023-01-05 DIAGNOSIS — C7951 Secondary malignant neoplasm of bone: Secondary | ICD-10-CM | POA: Insufficient documentation

## 2023-01-05 DIAGNOSIS — Z5111 Encounter for antineoplastic chemotherapy: Secondary | ICD-10-CM | POA: Diagnosis present

## 2023-01-05 DIAGNOSIS — C61 Malignant neoplasm of prostate: Secondary | ICD-10-CM | POA: Diagnosis present

## 2023-01-05 DIAGNOSIS — Z79899 Other long term (current) drug therapy: Secondary | ICD-10-CM | POA: Diagnosis not present

## 2023-01-05 LAB — CMP (CANCER CENTER ONLY)
ALT: 19 U/L (ref 0–44)
AST: 20 U/L (ref 15–41)
Albumin: 4.4 g/dL (ref 3.5–5.0)
Alkaline Phosphatase: 54 U/L (ref 38–126)
Anion gap: 6 (ref 5–15)
BUN: 20 mg/dL (ref 6–20)
CO2: 29 mmol/L (ref 22–32)
Calcium: 9.4 mg/dL (ref 8.9–10.3)
Chloride: 106 mmol/L (ref 98–111)
Creatinine: 0.93 mg/dL (ref 0.61–1.24)
GFR, Estimated: >60 mL/min (ref >60)
Glucose, Bld: 95 mg/dL (ref 70–99)
Potassium: 4.5 mmol/L (ref 3.5–5.1)
Sodium: 141 mmol/L (ref 135–145)
Total Bilirubin: 1.7 mg/dL — ABNORMAL HIGH (ref 0.3–1.2)
Total Protein: 7 g/dL (ref 6.5–8.1)

## 2023-01-05 LAB — CBC WITH DIFFERENTIAL (CANCER CENTER ONLY)
Abs Immature Granulocytes: 0.02 10*3/uL (ref 0.00–0.07)
Basophils Absolute: 0 10*3/uL (ref 0.0–0.1)
Basophils Relative: 1 %
Eosinophils Absolute: 0.1 10*3/uL (ref 0.0–0.5)
Eosinophils Relative: 3 %
HCT: 43.6 % (ref 39.0–52.0)
Hemoglobin: 14.9 g/dL (ref 13.0–17.0)
Immature Granulocytes: 0 %
Lymphocytes Relative: 39 %
Lymphs Abs: 1.8 10*3/uL (ref 0.7–4.0)
MCH: 30.7 pg (ref 26.0–34.0)
MCHC: 34.2 g/dL (ref 30.0–36.0)
MCV: 89.9 fL (ref 80.0–100.0)
Monocytes Absolute: 0.4 10*3/uL (ref 0.1–1.0)
Monocytes Relative: 9 %
Neutro Abs: 2.2 10*3/uL (ref 1.7–7.7)
Neutrophils Relative %: 48 %
Platelet Count: 216 10*3/uL (ref 150–400)
RBC: 4.85 MIL/uL (ref 4.22–5.81)
RDW: 12.3 % (ref 11.5–15.5)
WBC Count: 4.5 10*3/uL (ref 4.0–10.5)
nRBC: 0 % (ref 0.0–0.2)

## 2023-01-06 LAB — PROSTATE-SPECIFIC AG, SERUM (LABCORP): Prostate Specific Ag, Serum: <0.1 ng/mL (ref 0.0–4.0)

## 2023-01-07 ENCOUNTER — Other Ambulatory Visit (HOSPITAL_COMMUNITY): Payer: Self-pay

## 2023-01-07 ENCOUNTER — Encounter: Payer: Self-pay | Admitting: Hematology

## 2023-01-07 ENCOUNTER — Other Ambulatory Visit: Payer: Self-pay

## 2023-01-07 ENCOUNTER — Inpatient Hospital Stay: Payer: BC Managed Care – PPO

## 2023-01-07 ENCOUNTER — Inpatient Hospital Stay (HOSPITAL_BASED_OUTPATIENT_CLINIC_OR_DEPARTMENT_OTHER): Payer: BC Managed Care – PPO | Admitting: Hematology

## 2023-01-07 VITALS — BP 138/84 | HR 61 | Temp 97.7°F | Resp 17 | Ht 68.0 in | Wt 156.8 lb

## 2023-01-07 DIAGNOSIS — C61 Malignant neoplasm of prostate: Secondary | ICD-10-CM

## 2023-01-07 MED ORDER — DENOSUMAB 120 MG/1.7ML ~~LOC~~ SOLN
120.0000 mg | Freq: Once | SUBCUTANEOUS | Status: AC
  Administered 2023-01-07: 120 mg via SUBCUTANEOUS
  Filled 2023-01-07: qty 1.7

## 2023-01-07 MED ORDER — OYSTER SHELL CALCIUM W/D 500-5 MG-MCG PO TABS
1.0000 | ORAL_TABLET | Freq: Two times a day (BID) | ORAL | 3 refills | Status: DC
  Filled 2023-01-07 – 2023-01-12 (×2): qty 60, 30d supply, fill #0
  Filled 2023-02-16: qty 60, 30d supply, fill #1
  Filled 2023-03-22: qty 60, 30d supply, fill #2
  Filled 2023-04-23: qty 60, 30d supply, fill #3

## 2023-01-07 MED ORDER — LEUPROLIDE ACETATE (3 MONTH) 22.5 MG ~~LOC~~ KIT
22.5000 mg | PACK | Freq: Once | SUBCUTANEOUS | Status: AC
  Administered 2023-01-07: 22.5 mg via SUBCUTANEOUS
  Filled 2023-01-07: qty 22.5

## 2023-01-07 NOTE — Assessment & Plan Note (Signed)
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  Status post prostatectomy, T2cN0 -The patient continued to have rise in his PSA up to 52 after prostatectomy without any measurable disease.  Lupron was started at that time. Casodex 50 mg daily added in 05/2011. Therapy discontinued in March 2018 because of progression of disease. -Status post radiation therapy to the pelvic bone completed in June 2019 he received 50 Gy in 10 fractions. -currently on Eligard 22.5 mg every 3 months, Zytiga 1000 mg daily with prednisone 5 mg daily since May 2018, and Xgeva 120 mg every 3 months. -He is clinically doing well, no significant pain or other concerns.  Lab reviewed, his PSA has been undetectable, last pet scan showed minimal disease, he has had excellent response to treatment.

## 2023-01-07 NOTE — Progress Notes (Signed)
[facility]   Telephone:(336) [phone] Fax:(336) [phone]   Clinic Follow up Note   Patient Care Team: Ardith Dark, MD as PCP - General (Family Medicine) Billie Ruddy, OD as Referring Physician (Optometry) Malachy Mood, MD as Attending Physician (Hematology and Oncology)  Date of Service:  01/07/2023  CHIEF COMPLAINT: f/u of Prostate Cancer   CURRENT THERAPY:   Eligard 22.5 mg every 3 months.  He will receive his next injection today and continued indefinitely.   Zytiga 1000 mg daily with prednisone 5 mg daily since May 2018.   Xgeva 120 mg every 3 months started on 06/20/2019.  This will be continued indefinitely.    ASSESSMENT:  David Huffman is a 62 y.o. 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  Status post prostatectomy, T2cN0 -The patient continued to have rise in his PSA up to 52 after prostatectomy without any measurable disease.  Lupron was started at that time. Casodex 50 mg daily added in 05/2011. Therapy discontinued in March 2018 because of progression of disease. -Status post radiation therapy to the pelvic bone completed in June 2019 he received 50 Gy in 10 fractions. -currently on Eligard 22.5 mg every 3 months, Zytiga 1000 mg daily with prednisone 5 mg daily since May 2018, and Xgeva 120 mg every 3 months. -He is clinically doing well, no significant pain or other concerns.  Lab reviewed, his PSA has been undetectable, last pet scan showed minimal disease, he has had excellent response to treatment. -plan to stop Xgeva in 05/2024 (5 years therapy) -continue calcium and vitD       PLAN: -lab reviewed -I prescribe Oyster shell calcium -recent PSA-undetectable -encourage the pt to stay active -continue with ELIGARD AND XGEVA every 3 months  -lab and f/u and injection in 3  months  SUMMARY OF ONCOLOGIC HISTORY: Oncology History Overview Note   Cancer Staging  Malignant neoplasm of prostate ([facility]) Staging form: Prostate, AJCC 7th Edition - Clinical: Stage IIB (T2c, N0, M0) - Signed by Benjiman Core, MD on 10/27/2013     Malignant neoplasm of prostate ([facility])  09/18/2011 Initial Diagnosis   Malignant neoplasm of prostate ([facility])   10/05/2020 Genetic Testing   Negative genetic testing on the CancerNext-Expanded+RNAinsight panel.  The CancerNext-Expanded gene panel offered by [facility] and includes sequencing and rearrangement analysis for the following 77 genes: AIP, ALK, APC*, ATM*, AXIN2, BAP1, BARD1, BLM, BMPR1A, BRCA1*, BRCA2*, BRIP1*, CDC73, CDH1*, CDK4, CDKN1B, CDKN2A, CHEK2*, CTNNA1, DICER1, FANCC, FH, FLCN, GALNT12, KIF1B, LZTR1, MAX, MEN1, MET, MLH1*, MSH2*, MSH3, MSH6*, MUTYH*, NBN, NF1*, NF2, NTHL1, PALB2*, PHOX2B, PMS2*, POT1, PRKAR1A, PTCH1, PTEN*, RAD51C*, RAD51D*, RB1, RECQL, RET, SDHA, SDHAF2, SDHB, SDHC, SDHD, SMAD4, SMARCA4, SMARCB1, SMARCE1, STK11, SUFU, TMEM127, TP53*, TSC1, TSC2, VHL and XRCC2 (sequencing and deletion/duplication); EGFR, EGLN1, HOXB13, KIT, MITF, PDGFRA, POLD1, and POLE (sequencing only); EPCAM and GREM1 (deletion/duplication only). DNA and RNA analyses performed for * genes. The report date is October 05, 2020.      INTERVAL HISTORY:  David Huffman is here for a follow up of Prostate Cancer. He was last seen by me on 10/08/2022. He presents to the clinic accompanied by wife.   All other systems were reviewed with the patient and are negative.  MEDICAL HISTORY:  Past Medical History:  Diagnosis Date   Family history of  breast cancer    Family history of prostate cancer    Prostate cancer ([facility]) 08/2006    SURGICAL HISTORY: Past Surgical History:  Procedure Laterality Date   PROSTATE BIOPSY     PROSTATECTOMY     VASECTOMY      I have reviewed the social history and family history with the patient and they are  unchanged from previous note.  ALLERGIES:  has No Known Allergies.  MEDICATIONS:  Current Outpatient Medications  Medication Sig Dispense Refill   abiraterone acetate (ZYTIGA) 250 MG tablet TAKE 4 TABLETS (1000MG ) BY MOUTH DAILY ON AN EMPTY STOMACH 1 HOUR BEFORE OR 2 HOURS AFTER A MEAL 120 tablet 2   Ascorbic Acid (VITAMIN C) 1000 MG tablet Take 1,000 mg by mouth daily.     b complex vitamins tablet Take 1 tablet by mouth daily.     Calcium Carb-Cholecalciferol (OYSTER SHELL CALCIUM W/D) 500-5 MG-MCG TABS Take 1 tablet by mouth 2 (two) times daily. 60 tablet 3   denosumab (XGEVA) 120 MG/1.7ML SOLN injection Inject 120 mg into the skin once.     leuprolide (LUPRON) 22.5 MG injection Inject 22.5 mg into the muscle every 3 (three) months.     Melatonin 10 MG CAPS Take 12 mg by mouth 2 (two) times daily at 8 am and 10 pm.     Multiple Vitamins-Minerals (MULTIVITAMIN PO) Take 1 tablet by mouth daily.     Omega-3 Fatty Acids (FISH OIL) 1000 MG CAPS Take 1,000 mg by mouth daily.     predniSONE (DELTASONE) 5 MG tablet Take 1 tablet (5 mg total) by mouth daily with breakfast. 90 tablet 3   VITAMIN D, ERGOCALCIFEROL, PO Take 1 tablet by mouth daily.     No current facility-administered medications for this visit.    PHYSICAL EXAMINATION: ECOG PERFORMANCE STATUS: 0 - Asymptomatic  Vitals:   01/07/23 0913  BP: 138/84  Pulse: 61  Resp: 17  Temp: 97.7 F (36.5 C)  SpO2: 100%   Wt Readings from Last 3 Encounters:  01/07/23 156 lb 12.8 oz (71.1 kg)  10/08/22 158 lb 12.8 oz (72 kg)  09/03/22 160 lb 3.2 oz (72.7 kg)     GENERAL:alert, no distress and comfortable SKIN: skin color normal, no rashes or significant lesions EYES: normal, Conjunctiva are pink and non-injected, sclera clear  NEURO: alert & oriented x 3 with fluent speech   LABORATORY DATA:  I have reviewed the data as listed    Latest Ref Rng & Units 01/05/2023    8:27 AM 10/08/2022    7:37 AM 09/03/2022    8:17 AM  CBC   WBC 4.0 - 10.5 K/uL 4.5  5.1  5.7   Hemoglobin 13.0 - 17.0 g/dL 16.1  09.6  04.5   Hematocrit 39.0 - 52.0 % 43.6  41.5  40.6   Platelets 150 - 400 K/uL 216  229  231         Latest Ref Rng & Units 01/05/2023    8:27 AM 10/08/2022    7:37 AM 09/03/2022    8:17 AM  CMP  Glucose 70 - 99 mg/dL 95  92  94   BUN 6 - 20 mg/dL 20  22  17    Creatinine 0.61 - 1.24 mg/dL 4.09  8.11  9.14   Sodium 135 - 145 mmol/L 141  141  141   Potassium 3.5 - 5.1 mmol/L 4.5  4.1  4.0   Chloride 98 - 111 mmol/L 106  106  107   CO2 22 - 32 mmol/L 29  24  27    Calcium 8.9 - 10.3 mg/dL 9.4  9.4  9.4   Total Protein 6.5 - 8.1 g/dL 7.0  7.1  6.9   Total Bilirubin 0.3 - 1.2 mg/dL 1.7  1.5  1.5   Alkaline Phos 38 - 126 U/L 54  48  50   AST 15 - 41 U/L 20  26  25    ALT 0 - 44 U/L 19  21  22        RADIOGRAPHIC STUDIES: I have personally reviewed the radiological images as listed and agreed with the findings in the report. No results found.    No orders of the defined types were placed in this encounter.  All questions were answered. The patient knows to call the clinic with any problems, questions or concerns. No barriers to learning was detected. The total time spent in the appointment was 25 minutes.     Malachy Mood, MD 01/07/2023   Carolin Coy, CMA, am acting as scribe for Malachy Mood, MD.   I have reviewed the above documentation for accuracy and completeness, and I agree with the above.

## 2023-01-07 NOTE — Patient Instructions (Signed)
Leuprolide Suspension for Injection (Prostate Cancer) What is this medication? LEUPROLIDE (loo PROE lide) reduces the symptoms of prostate cancer. It works by decreasing levels of the hormone testosterone in the body. This prevents prostate cancer cells from spreading or growing. This medicine may be used for other purposes; ask your health care provider or pharmacist if you have questions. COMMON BRAND NAME(S): Eligard, Lupron Depot, Lupron Depot-Ped, Lutrate Depot, Viadur What should I tell my care team before I take this medication? They need to know if you have any of these conditions: Diabetes Heart disease Heart failure High or low levels of electrolytes, such as magnesium, potassium, or sodium in your blood Irregular heartbeat or rhythm Seizures An unusual or allergic reaction to leuprolide, other medications, foods, dyes, or preservatives Pregnant or trying to get pregnant Breast-feeding How should I use this medication? This medication is injected under the skin or into a muscle. It is given by your care team in a hospital or clinic setting. Talk to your care team about the use of this medication in children. Special care may be needed. Overdosage: If you think you have taken too much of this medicine contact a poison control center or emergency room at once. NOTE: This medicine is only for you. Do not share this medicine with others. What if I miss a dose? Keep appointments for follow-up doses. It is important not to miss your dose. Call your care team if you are unable to keep an appointment. What may interact with this medication? Do not take this medication with any of the following: Cisapride Dronedarone Ketoconazole Levoketoconazole Pimozide Thioridazine This medication may also interact with the following: Other medications that cause heart rhythm changes This list may not describe all possible interactions. Give your health care provider a list of all the medicines,  herbs, non-prescription drugs, or dietary supplements you use. Also tell them if you smoke, drink alcohol, or use illegal drugs. Some items may interact with your medicine. What should I watch for while using this medication? Visit your care team for regular checks on your progress. Tell your care team if your symptoms do not start to get better or if they get worse. This medication may increase blood sugar. The risk may be higher in patients who already have diabetes. Ask your care team what you can do to lower the risk of diabetes while taking this medication. This medication may cause infertility. Talk to your care team if you are concerned about your fertility. Heart attacks and strokes have been reported with the use of this medication. Get emergency help if you develop signs or symptoms of a heart attack or stroke. Talk to your care team about the risks and benefits of this medication. What side effects may I notice from receiving this medication? Side effects that you should report to your care team as soon as possible: Allergic reactions--skin rash, itching, hives, swelling of the face, lips, tongue, or throat Heart attack--pain or tightness in the chest, shoulders, arms, or jaw, nausea, shortness of breath, cold or clammy skin, feeling faint or lightheaded Heart rhythm changes--fast or irregular heartbeat, dizziness, feeling faint or lightheaded, chest pain, trouble breathing High blood sugar (hyperglycemia)--increased thirst or amount of urine, unusual weakness or fatigue, blurry vision Mood swings, irritability, hostility Seizures Stroke--sudden numbness or weakness of the face, arm, or leg, trouble speaking, confusion, trouble walking, loss of balance or coordination, dizziness, severe headache, change in vision Thoughts of suicide or self-harm, worsening mood, feelings of depression Side   effects that usually do not require medical attention (report to your care team if they continue or  are bothersome): Bone pain Change in sex drive or performance General discomfort and fatigue Hot flashes Muscle pain Pain, redness, or irritation at injection site Swelling of the ankles, hands, or feet This list may not describe all possible side effects. Call your doctor for medical advice about side effects. You may report side effects to FDA at 1-800-FDA-1088. Where should I keep my medication? This medication is given in a hospital or clinic. It will not be stored at home. NOTE: This sheet is a summary. It may not cover all possible information. If you have questions about this medicine, talk to your doctor, pharmacist, or health care provider.  2023 Elsevier/Gold Standard (2021-10-20 00:00:00) Denosumab Injection (Oncology) What is this medication? DENOSUMAB (den oh SUE mab) prevents weakened bones caused by cancer. It may also be used to treat noncancerous bone tumors that cannot be removed by surgery. It can also be used to treat high calcium levels in the blood caused by cancer. It works by blocking a protein that causes bones to break down quickly. This slows down the release of calcium from bones, which lowers calcium levels in your blood. It also makes your bones stronger and less likely to break (fracture). This medicine may be used for other purposes; ask your health care provider or pharmacist if you have questions. COMMON BRAND NAME(S): XGEVA What should I tell my care team before I take this medication? They need to know if you have any of these conditions: Dental disease Having surgery or tooth extraction Infection Kidney disease Low levels of calcium or vitamin D in the blood Malnutrition On hemodialysis Skin conditions or sensitivity Thyroid or parathyroid disease An unusual reaction to denosumab, other medications, foods, dyes, or preservatives Pregnant or trying to get pregnant Breast-feeding How should I use this medication? This medication is for injection  under the skin. It is given by your care team in a hospital or clinic setting. A special MedGuide will be given to you before each treatment. Be sure to read this information carefully each time. Talk to your care team about the use of this medication in children. While it may be prescribed for children as young as 13 years for selected conditions, precautions do apply. Overdosage: If you think you have taken too much of this medicine contact a poison control center or emergency room at once. NOTE: This medicine is only for you. Do not share this medicine with others. What if I miss a dose? Keep appointments for follow-up doses. It is important not to miss your dose. Call your care team if you are unable to keep an appointment. What may interact with this medication? Do not take this medication with any of the following: Other medications containing denosumab This medication may also interact with the following: Medications that lower your chance of fighting infection Steroid medications, such as prednisone or cortisone This list may not describe all possible interactions. Give your health care provider a list of all the medicines, herbs, non-prescription drugs, or dietary supplements you use. Also tell them if you smoke, drink alcohol, or use illegal drugs. Some items may interact with your medicine. What should I watch for while using this medication? Your condition will be monitored carefully while you are receiving this medication. You may need blood work while taking this medication. This medication may increase your risk of getting an infection. Call your care team for   advice if you get a fever, chills, sore throat, or other symptoms of a cold or flu. Do not treat yourself. Try to avoid being around people who are sick. You should make sure you get enough calcium and vitamin D while you are taking this medication, unless your care team tells you not to. Discuss the foods you eat and the  vitamins you take with your care team. Some people who take this medication have severe bone, joint, or muscle pain. This medication may also increase your risk for jaw problems or a broken thigh bone. Tell your care team right away if you have severe pain in your jaw, bones, joints, or muscles. Tell your care team if you have any pain that does not go away or that gets worse. Talk to your care team if you may be pregnant. Serious birth defects can occur if you take this medication during pregnancy and for 5 months after the last dose. You will need a negative pregnancy test before starting this medication. Contraception is recommended while taking this medication and for 5 months after the last dose. Your care team can help you find the option that works for you. What side effects may I notice from receiving this medication? Side effects that you should report to your care team as soon as possible: Allergic reactions--skin rash, itching, hives, swelling of the face, lips, tongue, or throat Bone, joint, or muscle pain Low calcium level--muscle pain or cramps, confusion, tingling, or numbness in the hands or feet Osteonecrosis of the jaw--pain, swelling, or redness in the mouth, numbness of the jaw, poor healing after dental work, unusual discharge from the mouth, visible bones in the mouth Side effects that usually do not require medical attention (report to your care team if they continue or are bothersome): Cough Diarrhea Fatigue Headache Nausea This list may not describe all possible side effects. Call your doctor for medical advice about side effects. You may report side effects to FDA at 1-800-FDA-1088. Where should I keep my medication? This medication is given in a hospital or clinic. It will not be stored at home. NOTE: This sheet is a summary. It may not cover all possible information. If you have questions about this medicine, talk to your doctor, pharmacist, or health care provider.   2023 Elsevier/Gold Standard (2021-12-29 00:00:00)  

## 2023-01-12 ENCOUNTER — Encounter: Payer: Self-pay | Admitting: Hematology

## 2023-01-12 ENCOUNTER — Other Ambulatory Visit: Payer: Self-pay

## 2023-01-12 ENCOUNTER — Other Ambulatory Visit (HOSPITAL_COMMUNITY): Payer: Self-pay

## 2023-01-12 ENCOUNTER — Other Ambulatory Visit: Payer: Self-pay | Admitting: Hematology

## 2023-01-12 DIAGNOSIS — C61 Malignant neoplasm of prostate: Secondary | ICD-10-CM

## 2023-01-12 MED ORDER — ABIRATERONE ACETATE 250 MG PO TABS
ORAL_TABLET | ORAL | 2 refills | Status: DC
  Filled 2023-01-12: qty 120, 30d supply, fill #0
  Filled 2023-02-16: qty 120, 30d supply, fill #1
  Filled 2023-03-22: qty 120, 30d supply, fill #2

## 2023-01-13 ENCOUNTER — Other Ambulatory Visit (HOSPITAL_COMMUNITY): Payer: Self-pay

## 2023-01-13 ENCOUNTER — Other Ambulatory Visit: Payer: Self-pay

## 2023-01-15 ENCOUNTER — Other Ambulatory Visit (HOSPITAL_COMMUNITY): Payer: Self-pay

## 2023-01-15 ENCOUNTER — Other Ambulatory Visit: Payer: Self-pay

## 2023-02-09 ENCOUNTER — Other Ambulatory Visit (HOSPITAL_COMMUNITY): Payer: Self-pay

## 2023-02-12 ENCOUNTER — Other Ambulatory Visit (HOSPITAL_COMMUNITY): Payer: Self-pay

## 2023-02-15 ENCOUNTER — Encounter (HOSPITAL_COMMUNITY): Payer: Self-pay

## 2023-02-15 ENCOUNTER — Other Ambulatory Visit (HOSPITAL_COMMUNITY): Payer: Self-pay

## 2023-02-16 ENCOUNTER — Other Ambulatory Visit (HOSPITAL_COMMUNITY): Payer: Self-pay

## 2023-02-19 ENCOUNTER — Encounter: Payer: Self-pay | Admitting: Hematology

## 2023-02-19 ENCOUNTER — Other Ambulatory Visit (HOSPITAL_COMMUNITY): Payer: Self-pay

## 2023-03-16 ENCOUNTER — Other Ambulatory Visit (HOSPITAL_COMMUNITY): Payer: Self-pay

## 2023-03-18 ENCOUNTER — Other Ambulatory Visit (HOSPITAL_COMMUNITY): Payer: Self-pay

## 2023-03-22 ENCOUNTER — Encounter (HOSPITAL_COMMUNITY): Payer: Self-pay

## 2023-03-22 ENCOUNTER — Other Ambulatory Visit (HOSPITAL_COMMUNITY): Payer: Self-pay

## 2023-03-22 ENCOUNTER — Other Ambulatory Visit: Payer: Self-pay

## 2023-03-23 ENCOUNTER — Encounter: Payer: Self-pay | Admitting: Hematology

## 2023-03-23 ENCOUNTER — Other Ambulatory Visit: Payer: Self-pay

## 2023-04-06 ENCOUNTER — Other Ambulatory Visit: Payer: Self-pay

## 2023-04-06 ENCOUNTER — Inpatient Hospital Stay: Payer: BC Managed Care – PPO | Attending: Oncology

## 2023-04-06 DIAGNOSIS — Z79899 Other long term (current) drug therapy: Secondary | ICD-10-CM | POA: Insufficient documentation

## 2023-04-06 DIAGNOSIS — C7951 Secondary malignant neoplasm of bone: Secondary | ICD-10-CM | POA: Diagnosis present

## 2023-04-06 DIAGNOSIS — C61 Malignant neoplasm of prostate: Secondary | ICD-10-CM | POA: Diagnosis present

## 2023-04-06 DIAGNOSIS — Z5111 Encounter for antineoplastic chemotherapy: Secondary | ICD-10-CM | POA: Diagnosis present

## 2023-04-06 LAB — CMP (CANCER CENTER ONLY)
ALT: 17 U/L (ref 0–44)
AST: 20 U/L (ref 15–41)
Albumin: 4.3 g/dL (ref 3.5–5.0)
Alkaline Phosphatase: 58 U/L (ref 38–126)
Anion gap: 4 — ABNORMAL LOW (ref 5–15)
BUN: 20 mg/dL (ref 6–20)
CO2: 30 mmol/L (ref 22–32)
Calcium: 9.6 mg/dL (ref 8.9–10.3)
Chloride: 106 mmol/L (ref 98–111)
Creatinine: 1 mg/dL (ref 0.61–1.24)
GFR, Estimated: >60 mL/min (ref >60)
Glucose, Bld: 80 mg/dL (ref 70–99)
Potassium: 4.4 mmol/L (ref 3.5–5.1)
Sodium: 140 mmol/L (ref 135–145)
Total Bilirubin: 2.1 mg/dL — ABNORMAL HIGH (ref 0.3–1.2)
Total Protein: 7 g/dL (ref 6.5–8.1)

## 2023-04-06 LAB — CBC WITH DIFFERENTIAL (CANCER CENTER ONLY)
Abs Immature Granulocytes: 0.03 10*3/uL (ref 0.00–0.07)
Basophils Absolute: 0.1 10*3/uL (ref 0.0–0.1)
Basophils Relative: 1 %
Eosinophils Absolute: 0.2 10*3/uL (ref 0.0–0.5)
Eosinophils Relative: 3 %
HCT: 42.4 % (ref 39.0–52.0)
Hemoglobin: 14.7 g/dL (ref 13.0–17.0)
Immature Granulocytes: 1 %
Lymphocytes Relative: 40 %
Lymphs Abs: 2.1 10*3/uL (ref 0.7–4.0)
MCH: 30.7 pg (ref 26.0–34.0)
MCHC: 34.7 g/dL (ref 30.0–36.0)
MCV: 88.5 fL (ref 80.0–100.0)
Monocytes Absolute: 0.5 10*3/uL (ref 0.1–1.0)
Monocytes Relative: 9 %
Neutro Abs: 2.5 10*3/uL (ref 1.7–7.7)
Neutrophils Relative %: 46 %
Platelet Count: 228 10*3/uL (ref 150–400)
RBC: 4.79 MIL/uL (ref 4.22–5.81)
RDW: 12.4 % (ref 11.5–15.5)
WBC Count: 5.3 10*3/uL (ref 4.0–10.5)
nRBC: 0 % (ref 0.0–0.2)

## 2023-04-08 ENCOUNTER — Inpatient Hospital Stay (HOSPITAL_BASED_OUTPATIENT_CLINIC_OR_DEPARTMENT_OTHER): Payer: BC Managed Care – PPO | Admitting: Hematology

## 2023-04-08 ENCOUNTER — Other Ambulatory Visit (HOSPITAL_COMMUNITY): Payer: Self-pay

## 2023-04-08 ENCOUNTER — Inpatient Hospital Stay: Payer: BC Managed Care – PPO

## 2023-04-08 ENCOUNTER — Other Ambulatory Visit: Payer: Self-pay

## 2023-04-08 VITALS — BP 116/84 | HR 63 | Temp 97.6°F | Resp 16 | Ht 68.0 in | Wt 159.1 lb

## 2023-04-08 DIAGNOSIS — C61 Malignant neoplasm of prostate: Secondary | ICD-10-CM | POA: Diagnosis not present

## 2023-04-08 MED ORDER — ABIRATERONE ACETATE 250 MG PO TABS
ORAL_TABLET | ORAL | 2 refills | Status: DC
Start: 2023-04-08 — End: 2023-07-14
  Filled 2023-04-08: qty 120, fill #0
  Filled 2023-04-23: qty 120, 30d supply, fill #0
  Filled 2023-05-20: qty 120, 30d supply, fill #1
  Filled 2023-06-22: qty 120, 30d supply, fill #2

## 2023-04-08 MED ORDER — DENOSUMAB 120 MG/1.7ML ~~LOC~~ SOLN
120.0000 mg | Freq: Once | SUBCUTANEOUS | Status: DC
  Filled 2023-04-08: qty 1.7

## 2023-04-08 MED ORDER — LEUPROLIDE ACETATE (3 MONTH) 22.5 MG ~~LOC~~ KIT
22.5000 mg | PACK | Freq: Once | SUBCUTANEOUS | Status: AC
  Administered 2023-04-08: 22.5 mg via SUBCUTANEOUS
  Filled 2023-04-08: qty 22.5

## 2023-04-08 NOTE — Assessment & Plan Note (Signed)
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  Status post prostatectomy, T2cN0 -The patient continued to have rise in his PSA up to 52 after prostatectomy without any measurable disease.  Lupron was started at that time. Casodex 50 mg daily added in 05/2011. Therapy discontinued in March 2018 because of progression of disease. -Status post radiation therapy to the pelvic bone completed in June 2019 he received 50 Gy in 10 fractions. -currently on Eligard 22.5 mg every 3 months, Zytiga 1000 mg daily with prednisone 5 mg daily since May 2018, and Xgeva 120 mg every 3 months. -He is clinically doing well, no significant pain or other concerns.  Lab reviewed, his PSA has been undetectable, last pet scan showed minimal disease, he has had excellent response to treatment.

## 2023-04-08 NOTE — Progress Notes (Signed)
Pt. Seen Dr. Mosetta Putt today.  Here for injections Eligard and Xgeva.  Pt. States he was informed via Dr. Mosetta Putt will start to get Xgeva Q 6 months instead.  Secure chatted Dr. Mosetta Putt and Olegario Shearer.  Lachelle responded to secure chat.  Stating pt. will ONLY receive the Eligard today.  No Xgeva given

## 2023-04-08 NOTE — Progress Notes (Signed)
[facility]   [phone] [phone]   Clinic Follow up Note   Patient Care Team: Pcp, No as PCP - General Billie Ruddy, OD as Referring Physician (Optometry) Malachy Mood, MD as Attending Physician (Hematology and Oncology)  Date of Service:  04/08/2023  CHIEF COMPLAINT: f/u of Prostate Cancer   CURRENT THERAPY:   Eligard 22.5 mg every 3 months.  He will receive his next injection today and continued indefinitely.   Zytiga 1000 mg daily with prednisone 5 mg daily since May 2018.   Xgeva 120 mg every 3 months started on 06/20/2019.  This will be continued indefinitely.  ASSESSMENT:  David Huffman is a 62 y.o. 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  Status post prostatectomy, T2cN0 -The patient continued to have rise in his PSA up to 52 after prostatectomy without any measurable disease.  Lupron was started at that time. Casodex 50 mg daily added in 05/2011. Therapy discontinued in March 2018 because of progression of disease. -Status post radiation therapy to the pelvic bone completed in June 2019 he received 50 Gy in 10 fractions. -currently on Eligard 22.5 mg every 3 months, Zytiga 1000 mg daily with prednisone 5 mg daily since May 2018, and Xgeva 120 mg every 3 months. -He is clinically doing well, no significant pain or other concerns.  Lab reviewed, his PSA has been undetectable, last pet scan showed minimal disease, he has had excellent response to treatment.  -Will continue Eligard every 3 months, he has been on Xgeva for almost 4 years, will change to every 6 months  -We also discussed the role of imaging including CT or PSMA PET scan.  Given his undetectable PSA level, and clinically doing very well, I do not feel he needs a repeated image, but will repeat PSMA if his PSA start  rising or if he develops any new symptoms     PLAN: -lab reviewed -PSA-reviewed -0.1 -I recommend pt to stay active - I refill Zytiga -No Xgeva today, change to every 6 months  -Proceed with Eligard injection today and continue every 3 months -labs and f/u in 3 months  SUMMARY OF ONCOLOGIC HISTORY: Oncology History Overview Note   Cancer Staging  Malignant neoplasm of prostate (HCC) Staging form: Prostate, AJCC 7th Edition - Clinical: Stage IIB (T2c, N0, M0) - Signed by Benjiman Core, MD on 10/27/2013     Malignant neoplasm of prostate (HCC)  09/18/2011 Initial Diagnosis   Malignant neoplasm of prostate (HCC)   10/05/2020 Genetic Testing   Negative genetic testing on the CancerNext-Expanded+RNAinsight panel.  The CancerNext-Expanded gene panel offered by [facility] and includes sequencing and rearrangement analysis for the following 77 genes: AIP, ALK, APC*, ATM*, AXIN2, BAP1, BARD1, BLM, BMPR1A, BRCA1*, BRCA2*, BRIP1*, CDC73, CDH1*, CDK4, CDKN1B, CDKN2A, CHEK2*, CTNNA1, DICER1, FANCC, FH, FLCN, GALNT12, KIF1B, LZTR1, MAX, MEN1, MET, MLH1*, MSH2*, MSH3, MSH6*, MUTYH*, NBN, NF1*, NF2, NTHL1, PALB2*, PHOX2B, PMS2*, POT1, PRKAR1A, PTCH1, PTEN*, RAD51C*, RAD51D*, RB1, RECQL, RET, SDHA, SDHAF2, SDHB, SDHC, SDHD, SMAD4, SMARCA4, SMARCB1, SMARCE1, STK11, SUFU, TMEM127, TP53*, TSC1, TSC2, VHL and XRCC2 (sequencing and deletion/duplication); EGFR, EGLN1, HOXB13, KIT, MITF, PDGFRA, POLD1, and POLE (sequencing only); EPCAM and GREM1 (deletion/duplication only). DNA and RNA analyses performed for * genes. The report date is October 05, 2020.      INTERVAL  HISTORY:  David Huffman is here for a follow up of Prostate Cancer. He was last seen by me on 01/07/2023. He presents to the clinic accompanied by his wife. Pt state that he is doing well. Pt state that his appetite is good.     All other systems were reviewed with the patient and are negative.  MEDICAL HISTORY:  Past Medical History:   Diagnosis Date   Family history of breast cancer    Family history of prostate cancer    Prostate cancer (HCC) 08/2006    SURGICAL HISTORY: Past Surgical History:  Procedure Laterality Date   PROSTATE BIOPSY     PROSTATECTOMY     VASECTOMY      I have reviewed the social history and family history with the patient and they are unchanged from previous note.  ALLERGIES:  has No Known Allergies.  MEDICATIONS:  Current Outpatient Medications  Medication Sig Dispense Refill   abiraterone acetate (ZYTIGA) 250 MG tablet TAKE 4 TABLETS (1000MG ) BY MOUTH DAILY ON AN EMPTY STOMACH 1 HOUR BEFORE OR 2 HOURS AFTER A MEAL 120 tablet 2   Ascorbic Acid (VITAMIN C) 1000 MG tablet Take 1,000 mg by mouth daily.     b complex vitamins tablet Take 1 tablet by mouth daily.     Calcium Carb-Cholecalciferol (OYSTER SHELL CALCIUM W/D) 500-5 MG-MCG TABS Take 1 tablet by mouth 2 (two) times daily. 60 tablet 3   denosumab (XGEVA) 120 MG/1.7ML SOLN injection Inject 120 mg into the skin once.     leuprolide (LUPRON) 22.5 MG injection Inject 22.5 mg into the muscle every 3 (three) months.     Melatonin 10 MG CAPS Take 12 mg by mouth 2 (two) times daily at 8 am and 10 pm.     Multiple Vitamins-Minerals (MULTIVITAMIN PO) Take 1 tablet by mouth daily.     Omega-3 Fatty Acids (FISH OIL) 1000 MG CAPS Take 1,000 mg by mouth daily.     predniSONE (DELTASONE) 5 MG tablet Take 1 tablet (5 mg total) by mouth daily with breakfast. 90 tablet 3   VITAMIN D, ERGOCALCIFEROL, PO Take 1 tablet by mouth daily.     No current facility-administered medications for this visit.   Facility-Administered Medications Ordered in Other Visits  Medication Dose Route Frequency Provider Last Rate Last Admin   denosumab (XGEVA) injection 120 mg  120 mg Subcutaneous Once Malachy Mood, MD        PHYSICAL EXAMINATION: ECOG PERFORMANCE STATUS: 0 - Asymptomatic  Vitals:   04/08/23 0909  BP: 116/84  Pulse: 63  Resp: 16  Temp: 97.6 F  (36.4 C)  SpO2: 100%   Wt Readings from Last 3 Encounters:  04/08/23 159 lb 1.6 oz (72.2 kg)  01/07/23 156 lb 12.8 oz (71.1 kg)  10/08/22 158 lb 12.8 oz (72 kg)     GENERAL:alert, no distress and comfortable SKIN: skin color normal, no rashes or significant lesions EYES: normal, Conjunctiva are pink and non-injected, sclera clear  NEURO: alert & oriented x 3 with fluent speech   LABORATORY DATA:  I have reviewed the data as listed    Latest Ref Rng & Units 04/06/2023    9:07 AM 01/05/2023    8:27 AM 10/08/2022    7:37 AM  CBC  WBC 4.0 - 10.5 K/uL 5.3  4.5  5.1   Hemoglobin 13.0 - 17.0 g/dL 16.1  09.6  04.5   Hematocrit 39.0 - 52.0 % 42.4  43.6  41.5   Platelets  150 - 400 K/uL 228  216  229         Latest Ref Rng & Units 04/06/2023    9:07 AM 01/05/2023    8:27 AM 10/08/2022    7:37 AM  CMP  Glucose 70 - 99 mg/dL 80  95  92   BUN 6 - 20 mg/dL 20  20  22    Creatinine 0.61 - 1.24 mg/dL 1.61  0.96  0.45   Sodium 135 - 145 mmol/L 140  141  141   Potassium 3.5 - 5.1 mmol/L 4.4  4.5  4.1   Chloride 98 - 111 mmol/L 106  106  106   CO2 22 - 32 mmol/L 30  29  24    Calcium 8.9 - 10.3 mg/dL 9.6  9.4  9.4   Total Protein 6.5 - 8.1 g/dL 7.0  7.0  7.1   Total Bilirubin 0.3 - 1.2 mg/dL 2.1  1.7  1.5   Alkaline Phos 38 - 126 U/L 58  54  48   AST 15 - 41 U/L 20  20  26    ALT 0 - 44 U/L 17  19  21        RADIOGRAPHIC STUDIES: I have personally reviewed the radiological images as listed and agreed with the findings in the report. No results found.    No orders of the defined types were placed in this encounter.  All questions were answered. The patient knows to call the clinic with any problems, questions or concerns. No barriers to learning was detected. The total time spent in the appointment was 25 minutes.     Malachy Mood, MD 04/08/2023   Carolin Coy, CMA, am acting as scribe for Malachy Mood, MD.   I have reviewed the above documentation for accuracy and completeness,  and I agree with the above.

## 2023-04-15 ENCOUNTER — Other Ambulatory Visit (HOSPITAL_COMMUNITY): Payer: Self-pay

## 2023-04-21 ENCOUNTER — Other Ambulatory Visit (HOSPITAL_COMMUNITY): Payer: Self-pay

## 2023-04-21 ENCOUNTER — Encounter (HOSPITAL_COMMUNITY): Payer: Self-pay

## 2023-04-23 ENCOUNTER — Other Ambulatory Visit: Payer: Self-pay

## 2023-04-23 ENCOUNTER — Encounter: Payer: Self-pay | Admitting: Hematology

## 2023-04-27 ENCOUNTER — Other Ambulatory Visit: Payer: Self-pay

## 2023-05-14 ENCOUNTER — Other Ambulatory Visit (HOSPITAL_COMMUNITY): Payer: Self-pay

## 2023-05-17 ENCOUNTER — Other Ambulatory Visit: Payer: Self-pay

## 2023-05-20 ENCOUNTER — Other Ambulatory Visit: Payer: Self-pay

## 2023-05-20 ENCOUNTER — Other Ambulatory Visit: Payer: Self-pay | Admitting: Hematology

## 2023-05-20 DIAGNOSIS — C61 Malignant neoplasm of prostate: Secondary | ICD-10-CM

## 2023-05-20 MED ORDER — OYSTER SHELL CALCIUM W/D 500-5 MG-MCG PO TABS
1.0000 | ORAL_TABLET | Freq: Two times a day (BID) | ORAL | 3 refills | Status: DC
Start: 2023-05-20 — End: 2023-10-07
  Filled 2023-05-20: qty 60, 30d supply, fill #0
  Filled 2023-07-14: qty 60, 30d supply, fill #1
  Filled 2023-08-26: qty 60, 30d supply, fill #2
  Filled 2023-09-20: qty 60, 30d supply, fill #3

## 2023-05-20 NOTE — Progress Notes (Signed)
Specialty Pharmacy Refill Coordination Note  David Huffman is a 62 y.o. male contacted today regarding refills of specialty medication(s) Abiraterone Acetate .  Patient requested Delivery  on 05/27/23  to verified address [address]  [place] [address]   Medication will be filled on 05/26/23.

## 2023-05-21 ENCOUNTER — Other Ambulatory Visit: Payer: Self-pay

## 2023-05-26 ENCOUNTER — Encounter: Payer: Self-pay | Admitting: Hematology

## 2023-05-26 ENCOUNTER — Other Ambulatory Visit: Payer: Self-pay

## 2023-06-16 ENCOUNTER — Other Ambulatory Visit: Payer: Self-pay

## 2023-06-17 ENCOUNTER — Other Ambulatory Visit: Payer: Self-pay

## 2023-06-21 ENCOUNTER — Other Ambulatory Visit: Payer: Self-pay

## 2023-06-22 ENCOUNTER — Other Ambulatory Visit (HOSPITAL_COMMUNITY): Payer: Self-pay

## 2023-06-22 ENCOUNTER — Other Ambulatory Visit: Payer: Self-pay

## 2023-06-22 NOTE — Progress Notes (Signed)
Specialty Pharmacy Ongoing Clinical Assessment Note  Hogan Funaro is a 62 y.o. male who is being followed by the specialty pharmacy service for RxSp Oncology   Patient's specialty medication(s) reviewed today: Abiraterone Acetate   Missed doses in the last 4 weeks: 0   Patient/Caregiver did not have any additional questions or concerns.   Therapeutic benefit summary: Patient is achieving benefit   Adverse events/side effects summary: Experienced adverse events/side effects (fatigue, tolerable at this time)   Patient's therapy is appropriate to: Continue    Goals Addressed             This Visit's Progress    Slow Disease Progression       Patient is on track. Patient will maintain adherence.  PSA remains <0.1 ng/mL as of 04/06/23.         Follow up:  6 months  Servando Snare Specialty Pharmacist

## 2023-06-22 NOTE — Progress Notes (Signed)
Specialty Pharmacy Refill Coordination Note  David Huffman is a 62 y.o. male contacted today regarding refills of specialty medication(s) Abiraterone Acetate   Patient requested Delivery   Delivery date: 06/25/23   Verified address: [address]   [place] [address]   Medication will be filled on 06/24/23.

## 2023-07-06 ENCOUNTER — Ambulatory Visit: Payer: BC Managed Care – PPO | Admitting: Hematology

## 2023-07-06 ENCOUNTER — Ambulatory Visit: Payer: BC Managed Care – PPO

## 2023-07-06 ENCOUNTER — Inpatient Hospital Stay: Payer: BC Managed Care – PPO | Attending: Oncology

## 2023-07-06 DIAGNOSIS — C61 Malignant neoplasm of prostate: Secondary | ICD-10-CM

## 2023-07-06 DIAGNOSIS — Z79899 Other long term (current) drug therapy: Secondary | ICD-10-CM | POA: Insufficient documentation

## 2023-07-06 DIAGNOSIS — C7951 Secondary malignant neoplasm of bone: Secondary | ICD-10-CM | POA: Diagnosis present

## 2023-07-06 DIAGNOSIS — Z5111 Encounter for antineoplastic chemotherapy: Secondary | ICD-10-CM | POA: Diagnosis present

## 2023-07-06 LAB — CBC WITH DIFFERENTIAL (CANCER CENTER ONLY)
Abs Immature Granulocytes: 0.01 10*3/uL (ref 0.00–0.07)
Basophils Absolute: 0 10*3/uL (ref 0.0–0.1)
Basophils Relative: 0 %
Eosinophils Absolute: 0.1 10*3/uL (ref 0.0–0.5)
Eosinophils Relative: 3 %
HCT: 42.4 % (ref 39.0–52.0)
Hemoglobin: 14.6 g/dL (ref 13.0–17.0)
Immature Granulocytes: 0 %
Lymphocytes Relative: 38 %
Lymphs Abs: 1.9 10*3/uL (ref 0.7–4.0)
MCH: 30.6 pg (ref 26.0–34.0)
MCHC: 34.4 g/dL (ref 30.0–36.0)
MCV: 88.9 fL (ref 80.0–100.0)
Monocytes Absolute: 0.5 10*3/uL (ref 0.1–1.0)
Monocytes Relative: 10 %
Neutro Abs: 2.4 10*3/uL (ref 1.7–7.7)
Neutrophils Relative %: 49 %
Platelet Count: 229 10*3/uL (ref 150–400)
RBC: 4.77 MIL/uL (ref 4.22–5.81)
RDW: 12.1 % (ref 11.5–15.5)
WBC Count: 4.9 10*3/uL (ref 4.0–10.5)
nRBC: 0 % (ref 0.0–0.2)

## 2023-07-06 LAB — CMP (CANCER CENTER ONLY)
ALT: 17 U/L (ref 0–44)
AST: 21 U/L (ref 15–41)
Albumin: 4.3 g/dL (ref 3.5–5.0)
Alkaline Phosphatase: 55 U/L (ref 38–126)
Anion gap: 6 (ref 5–15)
BUN: 19 mg/dL (ref 6–20)
CO2: 30 mmol/L (ref 22–32)
Calcium: 9.8 mg/dL (ref 8.9–10.3)
Chloride: 105 mmol/L (ref 98–111)
Creatinine: 1.07 mg/dL (ref 0.61–1.24)
GFR, Estimated: >60 mL/min (ref >60)
Glucose, Bld: 96 mg/dL (ref 70–99)
Potassium: 4.3 mmol/L (ref 3.5–5.1)
Sodium: 141 mmol/L (ref 135–145)
Total Bilirubin: 1.8 mg/dL — ABNORMAL HIGH (ref <1.2)
Total Protein: 6.8 g/dL (ref 6.5–8.1)

## 2023-07-07 LAB — PROSTATE-SPECIFIC AG, SERUM (LABCORP): Prostate Specific Ag, Serum: <0.1 ng/mL (ref 0.0–4.0)

## 2023-07-08 ENCOUNTER — Inpatient Hospital Stay: Payer: BC Managed Care – PPO

## 2023-07-08 ENCOUNTER — Encounter: Payer: Self-pay | Admitting: Hematology

## 2023-07-08 ENCOUNTER — Inpatient Hospital Stay: Payer: BC Managed Care – PPO | Admitting: Hematology

## 2023-07-08 VITALS — BP 123/78 | HR 76 | Temp 97.5°F | Resp 18 | Ht 68.0 in | Wt 159.1 lb

## 2023-07-08 DIAGNOSIS — C61 Malignant neoplasm of prostate: Secondary | ICD-10-CM | POA: Diagnosis not present

## 2023-07-08 MED ORDER — LEUPROLIDE ACETATE (3 MONTH) 22.5 MG ~~LOC~~ KIT
22.5000 mg | PACK | Freq: Once | SUBCUTANEOUS | Status: AC
Start: 2023-07-08 — End: 2023-07-08
  Administered 2023-07-08: 22.5 mg via SUBCUTANEOUS
  Filled 2023-07-08: qty 22.5

## 2023-07-08 MED ORDER — DENOSUMAB 120 MG/1.7ML ~~LOC~~ SOLN
120.0000 mg | Freq: Once | SUBCUTANEOUS | Status: AC
  Administered 2023-07-08: 120 mg via SUBCUTANEOUS
  Filled 2023-07-08: qty 1.7

## 2023-07-08 NOTE — Progress Notes (Signed)
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  Status post prostatectomy, T2cN0 -The patient continued to have rise in his PSA up to 52 after prostatectomy without any measurable disease.  Lupron was started at that time. Casodex 50 mg daily added in 05/2011. Therapy discontinued in March 2018 because of progression of disease. -Status post radiation therapy to the pelvic bone completed in June 2019 he received 50 Gy in 10 fractions. -currently on Eligard 22.5 mg every 3 months, Zytiga 1000 mg daily with prednisone 5 mg daily since May 2018, and Xgeva 120 mg every 3 months. -He is clinically doing well, no significant pain or other concerns.  Lab reviewed, his PSA has been undetectable, last pet scan showed minimal disease, he has had excellent response to treatment.     Assessment and Plan    Metastatic Prostate Cancer PSA levels undetectable, indicating good disease control. On Zytiga and Eligard for six years, well-tolerated but causing fatigue. Last PET scan (Jan 2024) showed stable bone metastasis with mild uptake. Increased fatigue likely due to job demands and treatment side effects. Discussed PET scan necessity and insurance pre-approval. - Administer Eligard injection every three months - Administer Xgeva injection every six months - Order PET scan for mid-February 2025, patient wants to be reassured by image - Schedule lab work  one week before the office visit - Discuss PET scan results at the next visit - Consider reducing strenuous exercise during periods of increased physical activity at work  Ear Wax Impaction Sensation of clogged left ear, likely due to ear wax impaction. Over-the-counter treatments ineffective. Hearing occasionally impaired. - Refer to primary care physician for ear examination and potential wax removal - Advise using ear drops to soften the wax  General Health Maintenance Good health, stable weight, good sleep, healthy diet. Considering anxiety management. - Consider discussing low-dose citalopram for anxiety with primary care physician  Follow-up - Schedule follow-up appointment in three months - Ensure PET scan is pre-approved by insurance - Schedule PET scan and lab work for the same day.       SUMMARY OF ONCOLOGIC HISTORY: Oncology History Overview Note   Cancer Staging  Malignant neoplasm of prostate (HCC) Staging form: Prostate, AJCC 7th Edition - Clinical: Stage IIB (T2c, N0, M0) - Signed by Benjiman Core, MD on 10/27/2013     Malignant neoplasm of prostate (HCC)  09/18/2011 Initial Diagnosis   Malignant neoplasm of prostate (HCC)   10/05/2020 Genetic Testing   Negative genetic testing on the CancerNext-Expanded+RNAinsight panel.  The CancerNext-Expanded gene panel offered by [facility] and includes sequencing and rearrangement analysis for the following 77 genes: AIP, ALK, APC*, ATM*, AXIN2, BAP1, BARD1, BLM, BMPR1A, BRCA1*, BRCA2*, BRIP1*, CDC73, CDH1*, CDK4, CDKN1B, CDKN2A, CHEK2*, CTNNA1, DICER1, FANCC, FH, FLCN, GALNT12, KIF1B, LZTR1, MAX, MEN1, MET, MLH1*, MSH2*, MSH3, MSH6*, MUTYH*, NBN, NF1*, NF2, NTHL1, PALB2*, PHOX2B, PMS2*, POT1, PRKAR1A, PTCH1, PTEN*, RAD51C*, RAD51D*, RB1, RECQL, RET, SDHA, SDHAF2, SDHB, SDHC,  SDHD, SMAD4, SMARCA4, SMARCB1, SMARCE1, STK11, SUFU, TMEM127, TP53*, TSC1, TSC2, VHL and XRCC2 (sequencing and deletion/duplication); EGFR, EGLN1,  HOXB13, KIT, MITF, PDGFRA, POLD1, and POLE (sequencing only); EPCAM and GREM1 (deletion/duplication only). DNA and RNA analyses performed for * genes. The report date is October 05, 2020.      Discussed the use of AI scribe software for clinical note transcription with the patient, who gave verbal consent to proceed.  History of Present Illness   A 62 year old individual with a history of metastatic prostate cancer presents for a routine follow-up. He reports feeling generally well, with his main concern being fatigue. He describes his job as physically demanding and also engages in regular exercise, both of which he believes may contribute to his fatigue. He denies any difficulty performing his job or daily activities due to fatigue. He also mentions a sensation of ear wax build-up in his left ear, which he believes may be affecting his hearing slightly. He has tried over-the-counter wax removal treatments with limited success. He is currently on Zytiga for his prostate cancer and receives Eligard injections. His PSA levels are undetectable, indicating good control of his prostate cancer. He expresses interest in scheduling a PET scan for reassurance and to monitor the status of his disease.         All other systems were reviewed with the patient and are negative.  MEDICAL HISTORY:  Past Medical History:  Diagnosis Date   Family history of breast cancer    Family history of prostate cancer    Prostate cancer (HCC) 08/2006    SURGICAL HISTORY: Past Surgical History:  Procedure Laterality Date   PROSTATE BIOPSY     PROSTATECTOMY     VASECTOMY      I have reviewed the social history and family history with the patient and they are unchanged from previous note.  ALLERGIES:  has No Known Allergies.  MEDICATIONS:  Current Outpatient Medications  Medication Sig Dispense Refill   abiraterone acetate (ZYTIGA) 250 MG tablet TAKE 4 TABLETS (1000MG ) BY MOUTH DAILY ON AN EMPTY STOMACH 1  HOUR BEFORE OR 2 HOURS AFTER A MEAL 120 tablet 2   Ascorbic Acid (VITAMIN C) 1000 MG tablet Take 1,000 mg by mouth daily.     b complex vitamins tablet Take 1 tablet by mouth daily.     Calcium Carb-Cholecalciferol (OYSTER SHELL CALCIUM W/D) 500-5 MG-MCG TABS Take 1 tablet by mouth 2 (two) times daily. 60 tablet 3   denosumab (XGEVA) 120 MG/1.7ML SOLN injection Inject 120 mg into the skin once.     leuprolide (LUPRON) 22.5 MG injection Inject 22.5 mg into the muscle every 3 (three) months.     Melatonin 10 MG CAPS Take 12 mg by mouth 2 (two) times daily at 8 am and 10 pm.     Multiple Vitamins-Minerals (MULTIVITAMIN PO) Take 1 tablet by mouth daily.     Omega-3 Fatty Acids (FISH OIL) 1000 MG CAPS Take 1,000 mg by mouth daily.     predniSONE (DELTASONE) 5 MG tablet Take 1 tablet (5 mg total) by mouth daily with breakfast. 90 tablet 3   VITAMIN D, ERGOCALCIFEROL, PO Take 1 tablet by mouth daily.     No current facility-administered medications for this visit.    PHYSICAL EXAMINATION: ECOG PERFORMANCE STATUS: 0 - Asymptomatic  Vitals:   07/08/23 0905  BP: 123/78  Pulse: 76  Resp: 18  Temp: (!) 97.5 F (36.4 C)  SpO2: 100%   Wt Readings from Last  3 Encounters:  07/08/23 159 lb 1.6 oz (72.2 kg)  04/08/23 159 lb 1.6 oz (72.2 kg)  01/07/23 156 lb 12.8 oz (71.1 kg)     GENERAL:alert, no distress and comfortable SKIN: skin color, texture, turgor are normal, no rashes or significant lesions EYES: normal, Conjunctiva are pink and non-injected, sclera clear EAR: (+) ear wax in left ear  NECK: supple, thyroid normal size, non-tender, without nodularity LYMPH:  no palpable lymphadenopathy in the cervical, axillary  LUNGS: clear to auscultation and percussion with normal breathing effort HEART: regular rate & rhythm and no murmurs and no lower extremity edema ABDOMEN:abdomen soft, non-tender and normal bowel sounds Musculoskeletal:no cyanosis of digits and no clubbing  NEURO: alert &  oriented x 3 with fluent speech, no focal motor/sensory deficits   LABORATORY DATA:  I have reviewed the data as listed    Latest Ref Rng & Units 07/06/2023    8:18 AM 04/06/2023    9:07 AM 01/05/2023    8:27 AM  CBC  WBC 4.0 - 10.5 K/uL 4.9  5.3  4.5   Hemoglobin 13.0 - 17.0 g/dL 40.9  81.1  91.4   Hematocrit 39.0 - 52.0 % 42.4  42.4  43.6   Platelets 150 - 400 K/uL 229  228  216         Latest Ref Rng & Units 07/06/2023    8:18 AM 04/06/2023    9:07 AM 01/05/2023    8:27 AM  CMP  Glucose 70 - 99 mg/dL 96  80  95   BUN 6 - 20 mg/dL 19  20  20    Creatinine 0.61 - 1.24 mg/dL 7.82  9.56  2.13   Sodium 135 - 145 mmol/L 141  140  141   Potassium 3.5 - 5.1 mmol/L 4.3  4.4  4.5   Chloride 98 - 111 mmol/L 105  106  106   CO2 22 - 32 mmol/L 30  30  29    Calcium 8.9 - 10.3 mg/dL 9.8  9.6  9.4   Total Protein 6.5 - 8.1 g/dL 6.8  7.0  7.0   Total Bilirubin <1.2 mg/dL 1.8  2.1  1.7   Alkaline Phos 38 - 126 U/L 55  58  54   AST 15 - 41 U/L 21  20  20    ALT 0 - 44 U/L 17  17  19        RADIOGRAPHIC STUDIES: I have personally reviewed the radiological images as listed and agreed with the findings in the report. No results found.    Orders Placed This Encounter  Procedures   NM PET (PSMA) SKULL TO MID THIGH    Standing Status:   Future    Standing Expiration Date:   07/07/2024    Order Specific Question:   If indicated for the ordered procedure, I authorize the administration of a radiopharmaceutical per Radiology protocol    Answer:   Yes    Order Specific Question:   Preferred imaging location?    Answer:   [facility]   All questions were answered. The patient knows to call the clinic with any problems, questions or concerns. No barriers to learning was detected. The total time spent in the appointment was 30 minutes.     Malachy Mood, MD 07/08/2023

## 2023-07-08 NOTE — Assessment & Plan Note (Signed)
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  Status post prostatectomy, T2cN0 -The patient continued to have rise in his PSA up to 52 after prostatectomy without any measurable disease.  Lupron was started at that time. Casodex 50 mg daily added in 05/2011. Therapy discontinued in March 2018 because of progression of disease. -Status post radiation therapy to the pelvic bone completed in June 2019 he received 50 Gy in 10 fractions. -currently on Eligard 22.5 mg every 3 months, Zytiga 1000 mg daily with prednisone 5 mg daily since May 2018, and Xgeva 120 mg every 3 months. -He is clinically doing well, no significant pain or other concerns.  Lab reviewed, his PSA has been undetectable, last pet scan showed minimal disease, he has had excellent response to treatment.

## 2023-07-14 ENCOUNTER — Other Ambulatory Visit: Payer: Self-pay

## 2023-07-14 ENCOUNTER — Encounter: Payer: Self-pay | Admitting: Hematology

## 2023-07-14 ENCOUNTER — Other Ambulatory Visit (HOSPITAL_COMMUNITY): Payer: Self-pay

## 2023-07-14 ENCOUNTER — Other Ambulatory Visit: Payer: Self-pay | Admitting: Hematology

## 2023-07-14 DIAGNOSIS — C61 Malignant neoplasm of prostate: Secondary | ICD-10-CM

## 2023-07-14 MED ORDER — ABIRATERONE ACETATE 250 MG PO TABS
ORAL_TABLET | ORAL | 2 refills | Status: DC
  Filled 2023-07-14: qty 120, 30d supply, fill #0
  Filled 2023-08-16: qty 120, 30d supply, fill #1
  Filled 2023-09-20: qty 120, 30d supply, fill #2

## 2023-07-14 NOTE — Progress Notes (Signed)
Specialty Pharmacy Refill Coordination Note  David Huffman is a 62 y.o. male contacted today regarding refills of specialty medication(s) Abiraterone Acetate   Patient requested Delivery   Delivery date: 07/20/23   Verified address: [address]   [place] [address]   Medication will be filled on 07/19/23.  Refill request pending.

## 2023-07-19 ENCOUNTER — Other Ambulatory Visit: Payer: Self-pay

## 2023-08-12 ENCOUNTER — Other Ambulatory Visit: Payer: Self-pay

## 2023-08-16 ENCOUNTER — Other Ambulatory Visit: Payer: Self-pay

## 2023-08-16 NOTE — Progress Notes (Signed)
Specialty Pharmacy Refill Coordination Note  David Huffman is a 62 y.o. male contacted today regarding refills of specialty medication(s) Zytiga & Prednisone.  Patient requested (Patient-Rptd) Delivery   Delivery date: (Patient-Rptd) 08/24/23   Verified address: (Patient-Rptd) [address] [place] [address]   Medication will be filled on 08/23/23.

## 2023-08-23 ENCOUNTER — Other Ambulatory Visit: Payer: Self-pay

## 2023-08-26 ENCOUNTER — Other Ambulatory Visit (HOSPITAL_COMMUNITY): Payer: Self-pay

## 2023-08-26 ENCOUNTER — Other Ambulatory Visit: Payer: Self-pay

## 2023-08-26 ENCOUNTER — Encounter: Payer: Self-pay | Admitting: Hematology

## 2023-08-27 ENCOUNTER — Other Ambulatory Visit: Payer: Self-pay

## 2023-09-09 ENCOUNTER — Other Ambulatory Visit: Payer: Self-pay

## 2023-09-10 ENCOUNTER — Encounter: Payer: Self-pay | Admitting: Internal Medicine

## 2023-09-10 ENCOUNTER — Ambulatory Visit: Payer: BC Managed Care – PPO | Admitting: Internal Medicine

## 2023-09-10 ENCOUNTER — Other Ambulatory Visit: Payer: Self-pay

## 2023-09-10 ENCOUNTER — Encounter: Payer: Self-pay | Admitting: Hematology

## 2023-09-10 ENCOUNTER — Other Ambulatory Visit (HOSPITAL_COMMUNITY): Payer: Self-pay

## 2023-09-10 VITALS — BP 120/78 | HR 64 | Temp 97.1°F | Ht 68.0 in | Wt 157.4 lb

## 2023-09-10 DIAGNOSIS — E65 Localized adiposity: Secondary | ICD-10-CM | POA: Diagnosis not present

## 2023-09-10 DIAGNOSIS — B009 Herpesviral infection, unspecified: Secondary | ICD-10-CM

## 2023-09-10 DIAGNOSIS — C61 Malignant neoplasm of prostate: Secondary | ICD-10-CM

## 2023-09-10 DIAGNOSIS — J320 Chronic maxillary sinusitis: Secondary | ICD-10-CM

## 2023-09-10 DIAGNOSIS — Z79899 Other long term (current) drug therapy: Secondary | ICD-10-CM | POA: Diagnosis not present

## 2023-09-10 MED ORDER — VALACYCLOVIR HCL 500 MG PO TABS
500.0000 mg | ORAL_TABLET | Freq: Two times a day (BID) | ORAL | 0 refills | Status: AC
  Filled 2023-09-10: qty 30, 15d supply, fill #0

## 2023-09-10 MED ORDER — FLUTICASONE PROPIONATE 50 MCG/ACT NA SUSP
2.0000 | Freq: Every day | NASAL | 6 refills | Status: AC
  Filled 2023-09-10: qty 16, 30d supply, fill #0

## 2023-09-10 MED ORDER — PSEUDOEPHEDRINE HCL ER 120 MG PO TB12
120.0000 mg | ORAL_TABLET | Freq: Two times a day (BID) | ORAL | 0 refills | Status: DC
  Filled 2023-09-10: qty 20, 10d supply, fill #0

## 2023-09-10 MED ORDER — LORATADINE 10 MG PO TABS
10.0000 mg | ORAL_TABLET | Freq: Every day | ORAL | 11 refills | Status: AC
  Filled 2023-09-10: qty 30, 30d supply, fill #0

## 2023-09-10 MED ORDER — AMOXICILLIN-POT CLAVULANATE 875-125 MG PO TABS
1.0000 | ORAL_TABLET | Freq: Two times a day (BID) | ORAL | 0 refills | Status: DC
  Filled 2023-09-10: qty 20, 10d supply, fill #0

## 2023-09-10 MED ORDER — SIMPLY SALINE 0.9 % NA AERS
2.0000 | INHALATION_SPRAY | NASAL | 11 refills | Status: AC
  Filled 2023-09-10: qty 127, fill #0

## 2023-09-10 NOTE — Progress Notes (Unsigned)
[facility]  Phone: [phone]  - Medical Office Visit -  Visit Date: 09/10/2023 Patient: David Huffman   DOB: 1961/01/07   63 y.o. Male  MRN: [id number] Patient Care Team: Lula Olszewski, MD as PCP - General (Internal Medicine) Billie Ruddy, OD as Referring Physician (Optometry) Malachy Mood, MD as Attending Physician (Hematology and Oncology) Today's Health Care Provider: Lula Olszewski, MD  ===========================================   Assessment & Plan Prostate cancer [facility]) Prostate Cancer (Stage 2B) Diagnosed with stage 2B prostate cancer after a high PSA and core biopsy. Managed with Lupron and Zytiga, with regular follow-ups at the cancer center. Monitor for side effects such as weight gain, energy issues, and severe side effects including joint pain, hot flashes, hypertension, fluid retention, hypokalemia, liver function issues, severe bone and muscle pain, diabetes, heart problems, and spinal cord compression. Continue Lupron and Zytiga as prescribed. Monitor potassium and liver function. Follow up with oncologist Dr. Blake Divine every three months for blood work and management. Central adiposity  High risk medication use  Chronic maxillary sinusitis Chronic Sinusitis Presents with sinus and ear congestion, postnasal drip, and cough for the past week, possibly due to allergies, infection, or environmental factors like workplace dust. Absence of ear pain suggests infection is less likely. Ear irrigation was discussed, noting a minimal risk of eardrum perforation, with Debrox as an alternative. Start Flonase nasal spray and recommend Simply Saline nasal spray, Claritin, and Sudafed. Prescribe Augmentin if symptoms persist. Perform ear irrigation to remove wax blockage. 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 Discussed treatment options, preferring episodic treatment due to infrequency. Prescribe Valtrex (valacyclovir) 30 tablets for episodic treatment. Educate on taking Valtrex at the onset of symptoms to prevent outbreaks.  General Health Maintenance Declined an annual physical exam, citing regular blood work and oncologist follow-ups. Encouraged to consider annual wellness visits for comprehensive health monitoring.  Follow-up Schedule follow-up appointments as needed. Encourage monitoring symptoms and reporting any changes.    Diagnoses and all orders for this visit: Prostate cancer (HCC) Central adiposity High risk medication use Chronic maxillary sinusitis -     fluticasone (FLONASE) 50 MCG/ACT nasal spray; Place 2 sprays into both nostrils daily. -     Saline (SIMPLY SALINE) 0.9 % AERS; Place 2 each into the nose as directed. Use nightly for sinus hygiene long-term.  Can also be used as many times daily as desired to assist with clearing congested sinuses. -     loratadine (CLARITIN) 10 MG tablet; Take 1 tablet (10 mg total) by mouth daily. -     pseudoephedrine (SUDAFED) 120 MG 12 hr tablet; Take 1 tablet (120 mg total) by mouth 2 (two) times daily. -     amoxicillin-clavulanate (AUGMENTIN) 875-125 MG tablet; Take 1 tablet by mouth 2 (two) times daily. HSV (herpes simplex virus) infection -     valACYclovir (VALTREX) 500 MG tablet; Take 1 tablet (500 mg total) by mouth 2 (two) times daily for 4 days. Take for 3 days at a time in burst at first sign of outbreak   Future Appointments  Date Time Provider Department Center  09/30/2023  8:30 AM [facility] LAB [facility] None  10/07/2023  9:00 AM Malachy Mood, MD [facility] None  10/07/2023  9:45 AM [facility] FLUSH [facility] None      Subjective  63 y.o. male who has Malignant neoplasm of  prostate (HCC); Family history of prostate cancer; Family history of breast cancer; Central adiposity; and High risk medication use on their  problem list. His reasons/main concerns/chief complaints for today's office visit are New Patient (Initial Visit), Ears clogged (Mostly left ear.), and Nasal Congestion   History of Present Illness Patient with a history of stage 2B prostate cancer, presents with recent onset of sinus congestion, postnasal drip, cough, and ear congestion. The patient reports these symptoms have been present for approximately one week, with the exception of the ear congestion which has been ongoing for a longer duration. The patient attributes the ear congestion to the use of earbuds and exposure to a loud and dusty environment at work.  In addition to the aforementioned symptoms, the patient also reports experiencing energy issues, which he attributes to his current medications, Lupron and Zytiga, prescribed for his prostate cancer. The patient has been on these medications for a significant period, and he is managed by an oncologist at a cancer center.  The patient also reports a recent skin abrasion in the genital area, which has since healed. The patient believes this may have been a herpes outbreak, contracted from his spouse prior to their marriage. The patient reports these outbreaks are infrequent, occurring less than once a year, typically when he is tired or has been working hard.  The patient is otherwise active, enjoying activities such as working out and [employer]. He maintains a positive outlook despite his prostate cancer diagnosis, which he describes as a moderate setback rather than a minor hiccup or a life-destroying event.  Past Medical History - Stage 2B prostate cancer - Chronic sinusitis  Medications reviewed and modified: Current Outpatient Medications on File Prior to Visit  Medication Sig   abiraterone acetate (ZYTIGA) 250 MG tablet TAKE 4 TABLETS (1000MG ) BY MOUTH DAILY ON AN EMPTY STOMACH 1 HOUR BEFORE OR 2 HOURS AFTER A MEAL   Ascorbic Acid (VITAMIN C) 1000 MG tablet Take 1,000 mg  by mouth daily.   b complex vitamins tablet Take 1 tablet by mouth daily.   Calcium Carb-Cholecalciferol (OYSTER SHELL CALCIUM W/D) 500-5 MG-MCG TABS Take 1 tablet by mouth 2 (two) times daily.   denosumab (XGEVA) 120 MG/1.7ML SOLN injection Inject 120 mg into the skin once.   leuprolide (LUPRON) 22.5 MG injection Inject 22.5 mg into the muscle every 3 (three) months.   Melatonin 10 MG CAPS Take 12 mg by mouth 2 (two) times daily at 8 am and 10 pm.   Multiple Vitamins-Minerals (MULTIVITAMIN PO) Take 1 tablet by mouth daily.   Omega-3 Fatty Acids (FISH OIL) 1000 MG CAPS Take 1,000 mg by mouth daily.   predniSONE (DELTASONE) 5 MG tablet Take 1 tablet (5 mg total) by mouth daily with breakfast.   VITAMIN D, ERGOCALCIFEROL, PO Take 1 tablet by mouth daily.   No current facility-administered medications on file prior to visit.  There are no discontinued medications.  Problems: has Malignant neoplasm of prostate (HCC); Family history of prostate cancer; Family history of breast cancer; Central adiposity; and High risk medication use on their problem list. Current Meds  Medication Sig   abiraterone acetate (ZYTIGA) 250 MG tablet TAKE 4 TABLETS (1000MG ) BY MOUTH DAILY ON AN EMPTY STOMACH 1 HOUR BEFORE OR 2 HOURS AFTER A MEAL   amoxicillin-clavulanate (AUGMENTIN) 875-125 MG tablet Take 1 tablet by mouth 2 (two) times daily.   Ascorbic Acid (VITAMIN C) 1000 MG tablet Take 1,000 mg by mouth daily.   b complex  vitamins tablet Take 1 tablet by mouth daily.   Calcium Carb-Cholecalciferol (OYSTER SHELL CALCIUM W/D) 500-5 MG-MCG TABS Take 1 tablet by mouth 2 (two) times daily.   denosumab (XGEVA) 120 MG/1.7ML SOLN injection Inject 120 mg into the skin once.   fluticasone (FLONASE) 50 MCG/ACT nasal spray Place 2 sprays into both nostrils daily.   leuprolide (LUPRON) 22.5 MG injection Inject 22.5 mg into the muscle every 3 (three) months.   loratadine (CLARITIN) 10 MG tablet Take 1 tablet (10 mg total) by  mouth daily.   Melatonin 10 MG CAPS Take 12 mg by mouth 2 (two) times daily at 8 am and 10 pm.   Multiple Vitamins-Minerals (MULTIVITAMIN PO) Take 1 tablet by mouth daily.   Omega-3 Fatty Acids (FISH OIL) 1000 MG CAPS Take 1,000 mg by mouth daily.   predniSONE (DELTASONE) 5 MG tablet Take 1 tablet (5 mg total) by mouth daily with breakfast.   pseudoephedrine (SUDAFED) 120 MG 12 hr tablet Take 1 tablet (120 mg total) by mouth 2 (two) times daily.   Saline (SIMPLY SALINE) 0.9 % AERS Place 2 each into the nose as directed. Use nightly for sinus hygiene long-term.  Can also be used as many times daily as desired to assist with clearing congested sinuses.   valACYclovir (VALTREX) 500 MG tablet Take 1 tablet (500 mg total) by mouth 2 (two) times daily for 4 days. Take for 3 days at a time in burst at first sign of outbreak   VITAMIN D, ERGOCALCIFEROL, PO Take 1 tablet by mouth daily.   Allergies:  No Known Allergies Past Medical History:  has a past medical history of Family history of breast cancer, Family history of prostate cancer, and Prostate cancer (HCC) (08/2006). Past Surgical History:   has a past surgical history that includes Vasectomy; Prostate biopsy; and Prostatectomy. Social History:   reports that he has never smoked. He has never used smokeless tobacco. He reports current alcohol use. He reports that he does not use drugs. Family History:  family history includes Brain cancer in his father; Breast cancer (age of onset: 97) in his paternal grandmother; Cancer in his paternal grandfather, paternal uncle, and paternal uncle; Cancer (age of onset: 27) in his father; Clotting disorder in his maternal grandmother; Diabetes in his maternal aunt; Leukemia in his father; Lymphoma in his mother; Other in his maternal grandfather. Depression Screen and Health Maintenance:    09/10/2023    9:32 AM 06/03/2020    9:34 AM 11/29/2017    2:15 PM 05/02/2014    9:09 AM  PHQ 2/9 Scores  PHQ - 2 Score 0 0 0 0    Health Maintenance  Topic Date Due   COVID-19 Vaccine (1) 09/26/2023 (Originally 08/31/1965)   INFLUENZA VACCINE  11/22/2023 (Originally 03/25/2023)   Zoster Vaccines- Shingrix (1 of 2) 12/09/2023 (Originally 08/30/1981)   Colonoscopy  03/23/2024   DTaP/Tdap/Td (2 - Td or Tdap) 06/03/2030   Hepatitis C Screening  Completed   HIV Screening  Completed   HPV VACCINES  Aged Out   Immunization History  Administered Date(s) Administered   Hep A / Hep B 12/07/2011, 01/13/2012   Tdap 06/03/2020     Objective   Physical ExamBP 120/78   Pulse 64   Temp (!) 97.1 F (36.2 C) (Temporal)   Ht 5\' 8"  (1.727 m)   Wt 157 lb 6.4 oz (71.4 kg)   SpO2 100%   BMI 23.93 kg/m  Wt Readings from Last 10 Encounters:  09/10/23 157 lb 6.4 oz (71.4 kg)  07/08/23 159 lb 1.6 oz (72.2 kg)  04/08/23 159 lb 1.6 oz (72.2 kg)  01/07/23 156 lb 12.8 oz (71.1 kg)  10/08/22 158 lb 12.8 oz (72 kg)  09/03/22 160 lb 3.2 oz (72.7 kg)  07/02/22 158 lb 8 oz (71.9 kg)  04/02/22 156 lb 4.8 oz (70.9 kg)  12/26/21 152 lb 14.4 oz (69.4 kg)  09/26/21 155 lb 8 oz (70.5 kg)  Vital signs reviewed.  Nursing notes reviewed. Weight trend reviewed. Abnormalities and problem-specific physical exam findings:  HEENT: Right ear canal initially blocked with wax, left ear canal totally blocked. SKIN: Lesion consistent with healed abrasion, previously blistered. General Appearance:  Well developed, well nourished, well-groomed, healthy-appearing male with Body mass index is 23.93 kg/m. No acute distress appreciable.   Skin: Clear and well-hydrated. Pulmonary:  Normal work of breathing at rest, no respiratory distress apparent. SpO2: 100 %  Musculoskeletal: He demonstrates smooth and coordinated movements throughout all major joints.All extremities are intact.  Neurological:  Awake, alert, oriented, and engaged.  No obvious focal neurological deficits or cognitive impairments.  Sensorium seems unclouded.  Psychiatric:  Appropriate mood,  pleasant and cooperative demeanor, cheerful and engaged during the exam  Reviewed Results & Data Results            No results found for any visits on 09/10/23.  Appointment on 07/06/2023  Component Date Value   Sodium 07/06/2023 141    Potassium 07/06/2023 4.3    Chloride 07/06/2023 105    CO2 07/06/2023 30    Glucose, Bld 07/06/2023 96    BUN 07/06/2023 19    Creatinine 07/06/2023 1.07    Calcium 07/06/2023 9.8    Total Protein 07/06/2023 6.8    Albumin 07/06/2023 4.3    AST 07/06/2023 21    ALT 07/06/2023 17    Alkaline Phosphatase 07/06/2023 55    Total Bilirubin 07/06/2023 1.8 (H)    GFR, Estimated 07/06/2023 >60    Anion gap 07/06/2023 6    WBC Count 07/06/2023 4.9    RBC 07/06/2023 4.77    Hemoglobin 07/06/2023 14.6    HCT 07/06/2023 42.4    MCV 07/06/2023 88.9    MCH 07/06/2023 30.6    MCHC 07/06/2023 34.4    RDW 07/06/2023 12.1    Platelet Count 07/06/2023 229    nRBC 07/06/2023 0.0    Neutrophils Relative % 07/06/2023 49    Neutro Abs 07/06/2023 2.4    Lymphocytes Relative 07/06/2023 38    Lymphs Abs 07/06/2023 1.9    Monocytes Relative 07/06/2023 10    Monocytes Absolute 07/06/2023 0.5    Eosinophils Relative 07/06/2023 3    Eosinophils Absolute 07/06/2023 0.1    Basophils Relative 07/06/2023 0    Basophils Absolute 07/06/2023 0.0    Immature Granulocytes 07/06/2023 0    Abs Immature Granulocytes 07/06/2023 0.01    Prostate Specific Ag, Se* 07/06/2023 <0.1   Appointment on 04/06/2023  Component Date Value   Sodium 04/06/2023 140    Potassium 04/06/2023 4.4    Chloride 04/06/2023 106    CO2 04/06/2023 30    Glucose, Bld 04/06/2023 80    BUN 04/06/2023 20    Creatinine 04/06/2023 1.00    Calcium 04/06/2023 9.6    Total Protein 04/06/2023 7.0    Albumin 04/06/2023 4.3    AST 04/06/2023 20    ALT 04/06/2023 17    Alkaline Phosphatase 04/06/2023 58    Total Bilirubin 04/06/2023 2.1 (  H)    GFR, Estimated 04/06/2023 >60    Anion gap 04/06/2023 4  (L)    WBC Count 04/06/2023 5.3    RBC 04/06/2023 4.79    Hemoglobin 04/06/2023 14.7    HCT 04/06/2023 42.4    MCV 04/06/2023 88.5    MCH 04/06/2023 30.7    MCHC 04/06/2023 34.7    RDW 04/06/2023 12.4    Platelet Count 04/06/2023 228    nRBC 04/06/2023 0.0    Neutrophils Relative % 04/06/2023 46    Neutro Abs 04/06/2023 2.5    Lymphocytes Relative 04/06/2023 40    Lymphs Abs 04/06/2023 2.1    Monocytes Relative 04/06/2023 9    Monocytes Absolute 04/06/2023 0.5    Eosinophils Relative 04/06/2023 3    Eosinophils Absolute 04/06/2023 0.2    Basophils Relative 04/06/2023 1    Basophils Absolute 04/06/2023 0.1    Immature Granulocytes 04/06/2023 1    Abs Immature Granulocytes 04/06/2023 0.03    Prostate Specific Ag, Se* 04/06/2023 <0.1   Appointment on 01/05/2023  Component Date Value   Sodium 01/05/2023 141    Potassium 01/05/2023 4.5    Chloride 01/05/2023 106    CO2 01/05/2023 29    Glucose, Bld 01/05/2023 95    BUN 01/05/2023 20    Creatinine 01/05/2023 0.93    Calcium 01/05/2023 9.4    Total Protein 01/05/2023 7.0    Albumin 01/05/2023 4.4    AST 01/05/2023 20    ALT 01/05/2023 19    Alkaline Phosphatase 01/05/2023 54    Total Bilirubin 01/05/2023 1.7 (H)    GFR, Estimated 01/05/2023 >60    Anion gap 01/05/2023 6    WBC Count 01/05/2023 4.5    RBC 01/05/2023 4.85    Hemoglobin 01/05/2023 14.9    HCT 01/05/2023 43.6    MCV 01/05/2023 89.9    MCH 01/05/2023 30.7    MCHC 01/05/2023 34.2    RDW 01/05/2023 12.3    Platelet Count 01/05/2023 216    nRBC 01/05/2023 0.0    Neutrophils Relative % 01/05/2023 48    Neutro Abs 01/05/2023 2.2    Lymphocytes Relative 01/05/2023 39    Lymphs Abs 01/05/2023 1.8    Monocytes Relative 01/05/2023 9    Monocytes Absolute 01/05/2023 0.4    Eosinophils Relative 01/05/2023 3    Eosinophils Absolute 01/05/2023 0.1    Basophils Relative 01/05/2023 1    Basophils Absolute 01/05/2023 0.0    Immature Granulocytes 01/05/2023 0    Abs  Immature Granulocytes 01/05/2023 0.02    Prostate Specific Ag, Se* 01/05/2023 <0.1   Appointment on 10/08/2022  Component Date Value   Prostate Specific Ag, Se* 10/08/2022 <0.1    Sodium 10/08/2022 141    Potassium 10/08/2022 4.1    Chloride 10/08/2022 106    CO2 10/08/2022 24    Glucose, Bld 10/08/2022 92    BUN 10/08/2022 22 (H)    Creatinine 10/08/2022 1.19    Calcium 10/08/2022 9.4    Total Protein 10/08/2022 7.1    Albumin 10/08/2022 4.4    AST 10/08/2022 26    ALT 10/08/2022 21    Alkaline Phosphatase 10/08/2022 48    Total Bilirubin 10/08/2022 1.5 (H)    GFR, Estimated 10/08/2022 >60    Anion gap 10/08/2022 11    WBC Count 10/08/2022 5.1    RBC 10/08/2022 4.74    Hemoglobin 10/08/2022 14.6    HCT 10/08/2022 41.5    MCV 10/08/2022 87.6    MCH 10/08/2022  30.8    MCHC 10/08/2022 35.2    RDW 10/08/2022 12.4    Platelet Count 10/08/2022 229    nRBC 10/08/2022 0.0    Neutrophils Relative % 10/08/2022 41    Neutro Abs 10/08/2022 2.1    Lymphocytes Relative 10/08/2022 45    Lymphs Abs 10/08/2022 2.3    Monocytes Relative 10/08/2022 10    Monocytes Absolute 10/08/2022 0.5    Eosinophils Relative 10/08/2022 3    Eosinophils Absolute 10/08/2022 0.1    Basophils Relative 10/08/2022 1    Basophils Absolute 10/08/2022 0.0    Immature Granulocytes 10/08/2022 0    Abs Immature Granulocytes 10/08/2022 0.01    No image results found.   No results found.  NM PET (PSMA) SKULL TO MID THIGH Result Date: 08/31/2022 CLINICAL DATA:  Metastatic prostate carcinoma. Restaging. Prior prostatectomy and radiation therapy. EXAM: NUCLEAR MEDICINE PET SKULL BASE TO THIGH TECHNIQUE: 9.2 mCi F18 Piflufolastat (Pylarify) was injected intravenously. Full-ring PET imaging was performed from the skull base to thigh after the radiotracer. CT data was obtained and used for attenuation correction and anatomic localization. COMPARISON:  CT on 09/23/2021 FINDINGS: NECK No radiotracer activity in neck lymph  nodes. Incidental CT finding: None. CHEST No radiotracer accumulation within mediastinal or hilar lymph nodes. No suspicious pulmonary nodules on the CT scan. Incidental CT finding: None. ABDOMEN/PELVIS Prostate: Prior prostatectomy. No focal activity in the prostate bed. Lymph nodes: No abnormal radiotracer accumulation within pelvic or abdominal nodes. Liver: No evidence of liver metastasis. Incidental CT finding: None. 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 A small focus of radiotracer uptake is also seen in the left inferior pubis SUV max of 2.8, which shows no CT correlate, but is suspicious for bone metastasis. Old bilateral posterior rib fracture deformities are unchanged in appearance and show no radiotracer uptake. 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 Small focus of radiotracer uptake in the left inferior pubis without CT correlate, but suspicious for bone metastasis. No evidence of soft tissue metastatic disease. Electronically Signed   By: Danae Orleans M.D.   On: 08/31/2022 13:04          Additional notes: Initial Appointment Goals:  This initial visit focused on establishing a foundation for the patient's care. We collaboratively reviewed his medical history and medications in detail, updating the chart as shown in the encounter. Given the extensive information, we prioritized addressing his most pressing concerns, which he reported were: New Patient (Initial Visit), Ears clogged (Mostly left ear.), and Nasal Congestion  While the complexity of the patient's medical picture may necessitate further evaluation in subsequent visits, we were able to develop a preliminary care plan together. To expedite a comprehensive plan at the next visit,  we encouraged the patient to gather relevant medical records from previous providers. This collaborative approach will ensure a more complete understanding of the patient's health and inform the development of a personalized care plan. We look forward to continuing the conversation and working together with the patient on achieving his health goals.   Collaborative Documentation:  Today's encounter utilized real-time, dynamic patient engagement.  Patients actively participate by directly reviewing and assisting in updating their medical records through a shared screen. This transparency empowers patients to visually confirm chart updates made by the healthcare provider.  This collaborative approach facilitates problem management as we jointly update the problem list, problem overview, and assessment/plan. Ultimately, this process enhances chart accuracy and completeness, fostering shared decision-making, patient education, and informed consent for tests and treatments.  Collaborative Treatment Planning:  Treatment plans were discussed and reviewed in detail.  Explained medication safety and potential side effects.  Encouraged participation and answered all patient questions, confirming understanding and comfort with the plan. Encouraged patient to contact our office if they have any questions or concerns. Agreed on patient returning to office if symptoms worsen, persist, or new symptoms develop. Discussed precautions in case of needing to visit the Emergency Department.  ----------------------------------------------------- Lula Olszewski, MD  09/11/2023 11:07 AM  [facility]:  [phone]

## 2023-09-11 NOTE — Patient Instructions (Signed)
VISIT SUMMARY:  [name] visited today with concerns about sinus congestion, postnasal drip, cough, and ear congestion. He also discussed his ongoing prostate cancer treatment and a recent skin abrasion in the genital area. We reviewed his symptoms and treatment plans for each condition.  YOUR PLAN:  -CHRONIC SINUSITIS: Chronic sinusitis is a condition where the cavities around the nasal passages become inflamed and swollen for an extended period. For your symptoms, we recommend starting Flonase nasal spray and using Simply Saline nasal spray, Claritin, and Sudafed. If symptoms persist, we will prescribe Augmentin. We also performed ear irrigation to remove any wax blockage.  -PROSTATE CANCER (STAGE 2B): Stage 2B prostate cancer means the cancer is more advanced but still confined to the prostate. You are currently managed with Lupron and Zytiga, and we will continue monitoring for side effects. Please continue your medications as prescribed and follow up with your oncologist, Dr. Blake Divine, every three months for blood work and management.  -HERPES SIMPLEX VIRUS (HSV): Herpes Simplex Virus (HSV) is a common viral infection that causes sores, usually around the mouth or genitals. You reported a healed genital ulceration, likely due to HSV. Given the infrequency of your outbreaks, we discussed and decided on episodic treatment. We prescribed Valtrex (valacyclovir) 30 tablets to take at the onset of symptoms to prevent outbreaks.  -GENERAL HEALTH MAINTENANCE: Although you declined an annual physical exam, we encourage you to consider annual wellness visits for comprehensive health monitoring. Regular blood work and follow-ups with your oncologist are important, but a full physical exam can help catch other potential health issues early.  INSTRUCTIONS:  Please monitor your symptoms and report any changes. Follow up with your oncologist, Dr. Blake Divine, every three months for blood work and management. Schedule  follow-up appointments as needed.

## 2023-09-20 ENCOUNTER — Other Ambulatory Visit: Payer: Self-pay

## 2023-09-20 NOTE — Progress Notes (Signed)
Specialty Pharmacy Refill Coordination Note  David Huffman is a 63 y.o. male contacted today regarding refills of specialty medication(s) Abiraterone Acetate (ZYTIGA)   Patient requested Delivery   Delivery date: 09/23/23   Verified address: [address]   Medication will be filled on 09/22/23. Patient also requested Prednisone & Calcium.

## 2023-09-21 IMAGING — CT CT CHEST-ABD-PELV W/ CM
3 of 5 series · 15 of 36 positions shown, 17 images · IV contrast (OMNIPAQUE)
Comparison: 09/20/2020

CLINICAL DATA: Metastatic prostate cancer restaging

EXAM:
CT CHEST, ABDOMEN, AND PELVIS WITH CONTRAST
TECHNIQUE: Multidetector CT imaging of the chest, abdomen and pelvis was
performed following the standard protocol during bolus
administration of intravenous contrast.

[Series 2: cap with · axial · 0.77mm/px · z∈[-644,-109]mm · 9 of 135 slices shown, 11 images]
[im 14/135  mediastinal]
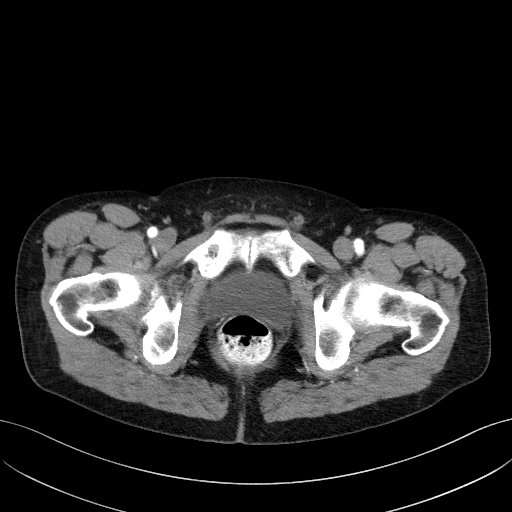
[im 14/135  bone]
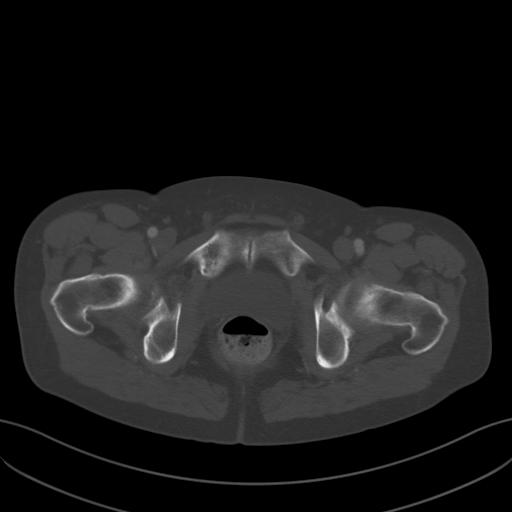
[im 27/135  mediastinal]
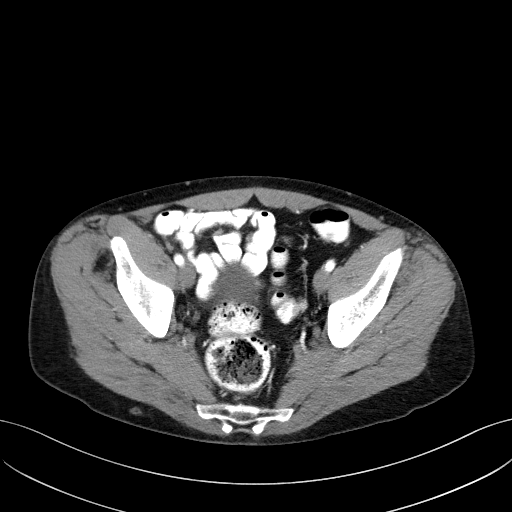
[im 41/135  mediastinal]
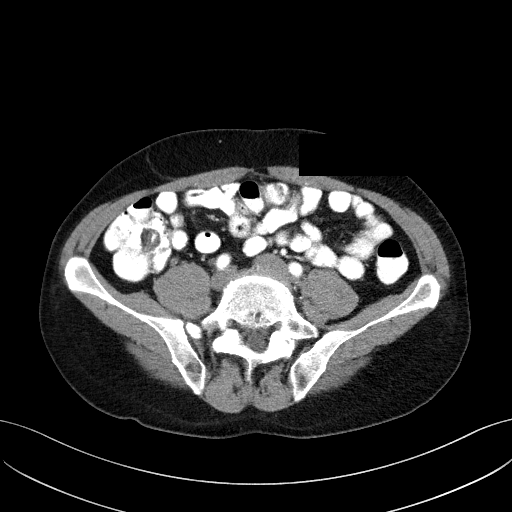
[im 54/135  mediastinal]
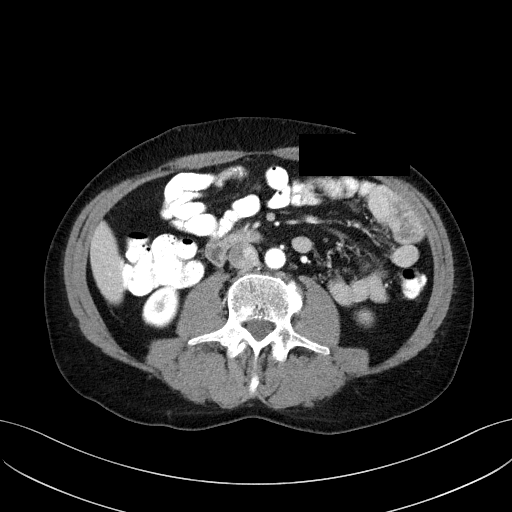
[im 68/135  mediastinal]
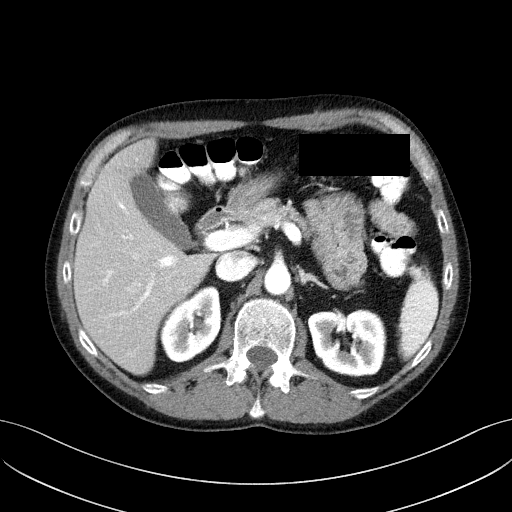
[im 81/135  mediastinal]
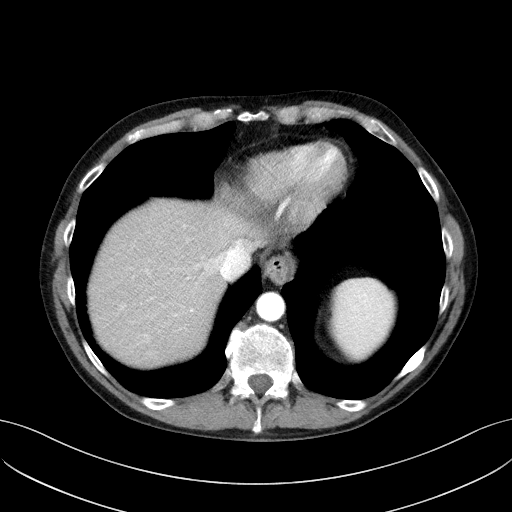
[im 94/135  mediastinal]
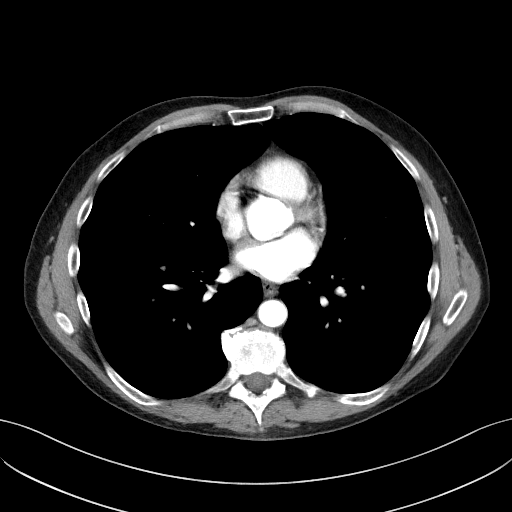
[im 108/135  mediastinal]
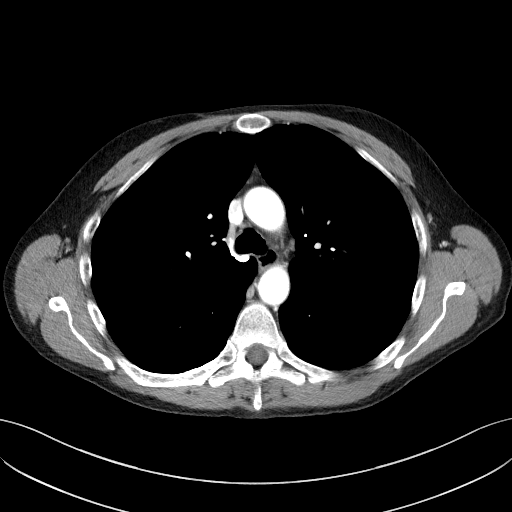
[im 121/135  mediastinal]
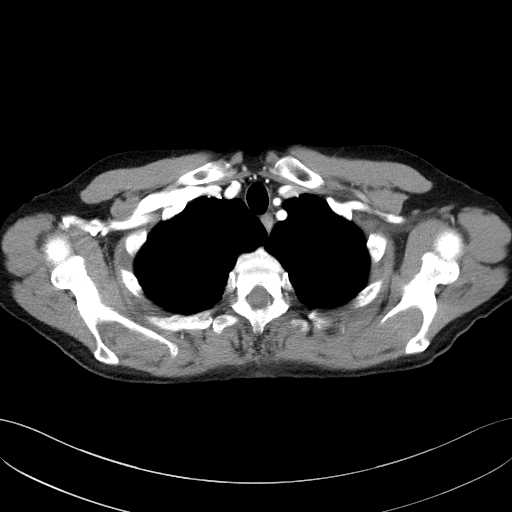
[im 121/135  bone]
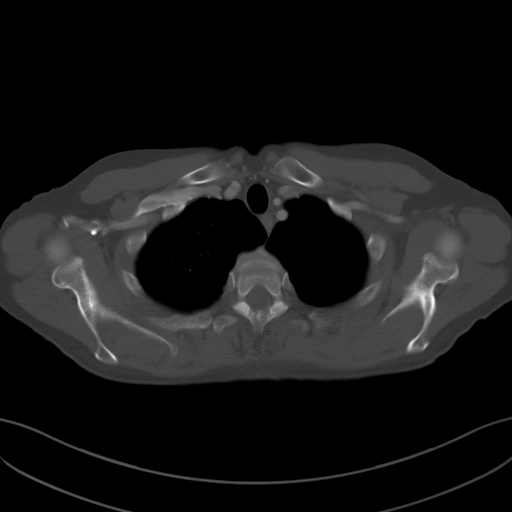

[Series 4: coronals · coronal · 0.93mm/px · 3 of 142 slices shown]
[im 29/142  mediastinal]
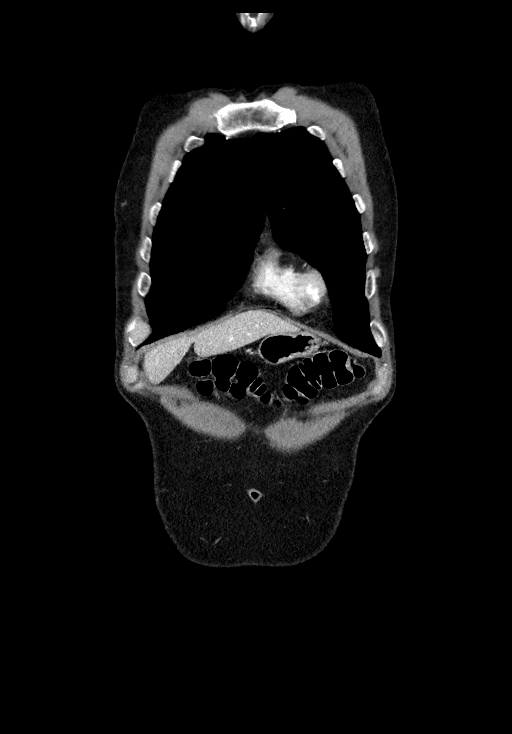
[im 57/142  mediastinal]
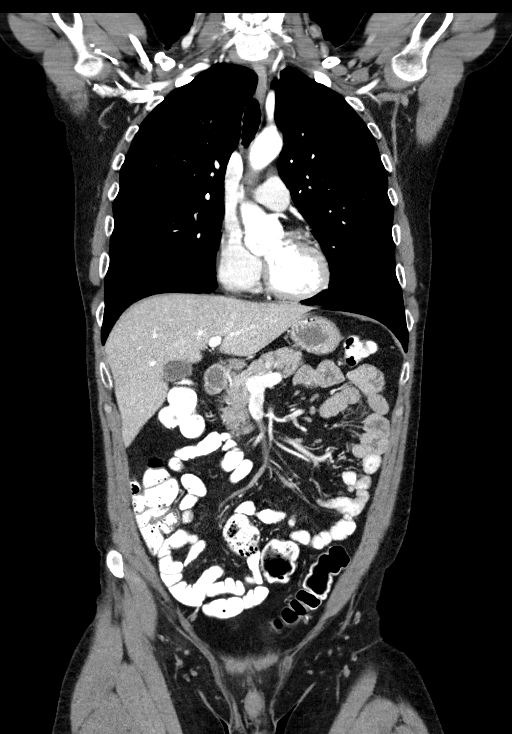
[im 85/142  mediastinal]
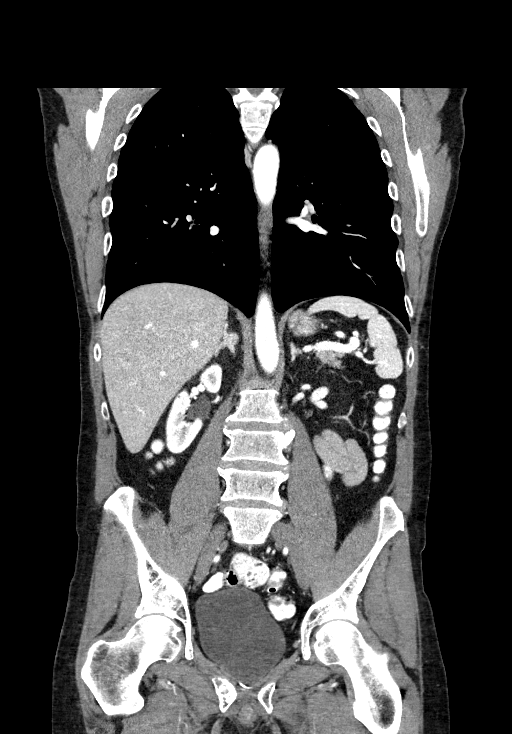

[Series 6: lung · axial · 0.77mm/px · z∈[-347,-269]mm · 3 of 167 slices shown]
[im 13/167  bone]
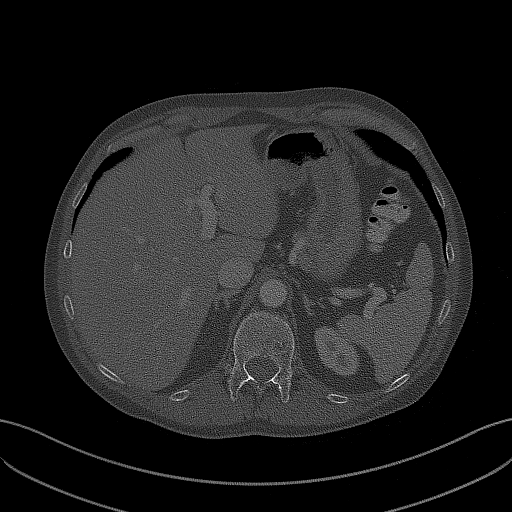
[im 39/167  bone]
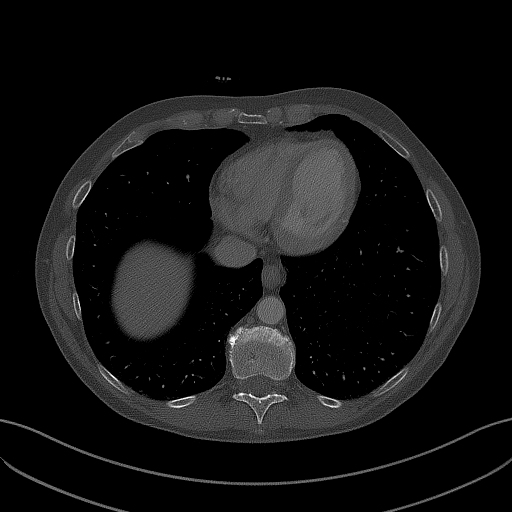
[im 52/167  bone]
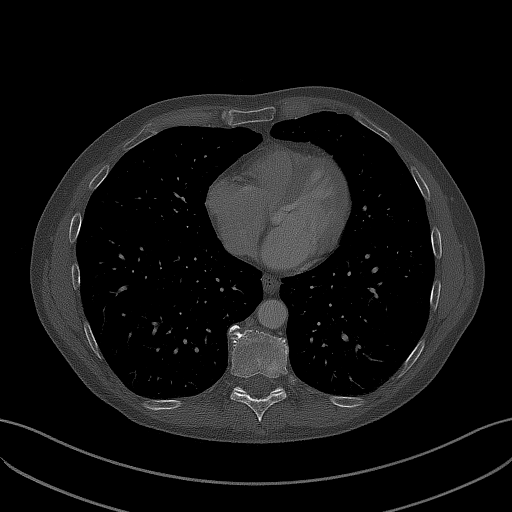

[15 of 36 positions shown; findings below may reference images not displayed]

RADIATION DOSE REDUCTION: This exam was performed according to the
departmental dose-optimization program which includes automated
exposure control, adjustment of the mA and/or kV according to
patient size and/or use of iterative reconstruction technique.

CONTRAST:  100mL OMNIPAQUE IOHEXOL 300 MG/ML SOLN, additional oral
enteric contrast
FINDINGS: CT CHEST FINDINGS

Cardiovascular: No significant vascular findings. Normal heart size.
No pericardial effusion.

Mediastinum/Nodes: No enlarged mediastinal, hilar, or axillary lymph
nodes. Thyroid gland, trachea, and esophagus demonstrate no
significant findings.

Lungs/Pleura: Lungs are clear. No pleural effusion or pneumothorax.

Musculoskeletal: No chest wall mass. There are 13 rib-bearing
vertebral bodies, a transitional T13 vertebral body with diminutive
accessory ribs and 5 lumbar type non rib-bearing vertebral bodies.
Unchanged small, densely sclerotic bone island of T5. Unchanged
occasional small vertebral body hemangiomata. Redemonstrated
sclerotic, chronic fractures of the posterior left fourth rib,
posterior right sixth through eighth ribs, and the posterior left
ninth and tenth ribs.

CT ABDOMEN PELVIS FINDINGS

Hepatobiliary: No solid liver abnormality is seen. No gallstones,
gallbladder wall thickening, or biliary dilatation.

Pancreas: Unremarkable. No pancreatic ductal dilatation or
surrounding inflammatory changes.

Spleen: Normal in size without significant abnormality.

Adrenals/Urinary Tract: Adrenal glands are unremarkable. Kidneys are
normal, without renal calculi, solid lesion, or hydronephrosis.
Bladder is unremarkable.

Stomach/Bowel: Stomach is within normal limits. Appendix appears
normal. No evidence of bowel wall thickening, distention, or
inflammatory changes.

Vascular/Lymphatic: No significant vascular findings are present. No
enlarged abdominal or pelvic lymph nodes.

Reproductive: Status post prostatectomy.

Other: No abdominal wall hernia or abnormality. No ascites.

Musculoskeletal: No acute osseous findings. Unchanged sclerotic
osseous lesions of the right pubic symphysis (series 2, image 124)
and inferior pubic ramus (series 2, image 130).
IMPRESSION: 1. Status post prostatectomy.
2. Unchanged sclerotic osseous metastatic lesions of the right pubic
symphysis and right inferior pubic ramus.
3. No CT evidence of new osseous or soft tissue metastatic disease
in the chest, abdomen, or pelvis.
4. Unchanged sclerotic fractures of the bilateral posterior ribs.

## 2023-09-22 ENCOUNTER — Other Ambulatory Visit: Payer: Self-pay

## 2023-09-22 ENCOUNTER — Encounter: Payer: Self-pay | Admitting: Hematology

## 2023-09-30 ENCOUNTER — Other Ambulatory Visit: Payer: BC Managed Care – PPO

## 2023-09-30 ENCOUNTER — Inpatient Hospital Stay: Payer: BC Managed Care – PPO | Attending: Oncology

## 2023-09-30 ENCOUNTER — Encounter (HOSPITAL_COMMUNITY)
Admission: RE | Admit: 2023-09-30 | Discharge: 2023-09-30 | Disposition: A | Discharge status: Home / Self Care | Payer: BC Managed Care – PPO | Source: Ambulatory Visit | Attending: Hematology | Admitting: Hematology

## 2023-09-30 DIAGNOSIS — Z5111 Encounter for antineoplastic chemotherapy: Secondary | ICD-10-CM | POA: Insufficient documentation

## 2023-09-30 DIAGNOSIS — C7951 Secondary malignant neoplasm of bone: Secondary | ICD-10-CM | POA: Insufficient documentation

## 2023-09-30 DIAGNOSIS — C61 Malignant neoplasm of prostate: Secondary | ICD-10-CM | POA: Insufficient documentation

## 2023-09-30 LAB — CBC WITH DIFFERENTIAL (CANCER CENTER ONLY)
Abs Immature Granulocytes: 0.02 10*3/uL (ref 0.00–0.07)
Basophils Absolute: 0 10*3/uL (ref 0.0–0.1)
Basophils Relative: 1 %
Eosinophils Absolute: 0.1 10*3/uL (ref 0.0–0.5)
Eosinophils Relative: 2 %
HCT: 42.9 % (ref 39.0–52.0)
Hemoglobin: 14.7 g/dL (ref 13.0–17.0)
Immature Granulocytes: 0 %
Lymphocytes Relative: 23 %
Lymphs Abs: 1.3 10*3/uL (ref 0.7–4.0)
MCH: 30.2 pg (ref 26.0–34.0)
MCHC: 34.3 g/dL (ref 30.0–36.0)
MCV: 88.1 fL (ref 80.0–100.0)
Monocytes Absolute: 0.3 10*3/uL (ref 0.1–1.0)
Monocytes Relative: 6 %
Neutro Abs: 3.8 10*3/uL (ref 1.7–7.7)
Neutrophils Relative %: 68 %
Platelet Count: 246 10*3/uL (ref 150–400)
RBC: 4.87 MIL/uL (ref 4.22–5.81)
RDW: 12.1 % (ref 11.5–15.5)
WBC Count: 5.6 10*3/uL (ref 4.0–10.5)
nRBC: 0 % (ref 0.0–0.2)

## 2023-09-30 LAB — CMP (CANCER CENTER ONLY)
ALT: 20 U/L (ref 0–44)
AST: 21 U/L (ref 15–41)
Albumin: 4.4 g/dL (ref 3.5–5.0)
Alkaline Phosphatase: 62 U/L (ref 38–126)
Anion gap: 5 (ref 5–15)
BUN: 17 mg/dL (ref 8–23)
CO2: 30 mmol/L (ref 22–32)
Calcium: 10.1 mg/dL (ref 8.9–10.3)
Chloride: 106 mmol/L (ref 98–111)
Creatinine: 1.04 mg/dL (ref 0.61–1.24)
GFR, Estimated: >60 mL/min (ref >60)
Glucose, Bld: 108 mg/dL — ABNORMAL HIGH (ref 70–99)
Potassium: 4.5 mmol/L (ref 3.5–5.1)
Sodium: 141 mmol/L (ref 135–145)
Total Bilirubin: 1.5 mg/dL — ABNORMAL HIGH (ref 0.0–1.2)
Total Protein: 7.2 g/dL (ref 6.5–8.1)

## 2023-09-30 MED ORDER — FLOTUFOLASTAT F 18 GALLIUM 296-5846 MBQ/ML IV SOLN
8.5620 | Freq: Once | INTRAVENOUS | Status: AC
  Administered 2023-09-30: 8.562 via INTRAVENOUS
  Filled 2023-09-30: qty 9

## 2023-10-01 LAB — PROSTATE-SPECIFIC AG, SERUM (LABCORP): Prostate Specific Ag, Serum: <0.1 ng/mL (ref 0.0–4.0)

## 2023-10-07 ENCOUNTER — Encounter: Payer: Self-pay | Admitting: Hematology

## 2023-10-07 ENCOUNTER — Other Ambulatory Visit: Payer: Self-pay

## 2023-10-07 ENCOUNTER — Inpatient Hospital Stay: Payer: BC Managed Care – PPO

## 2023-10-07 ENCOUNTER — Other Ambulatory Visit (HOSPITAL_COMMUNITY): Payer: Self-pay

## 2023-10-07 ENCOUNTER — Inpatient Hospital Stay (HOSPITAL_BASED_OUTPATIENT_CLINIC_OR_DEPARTMENT_OTHER): Payer: BC Managed Care – PPO | Admitting: Hematology

## 2023-10-07 VITALS — BP 127/65 | HR 64 | Temp 97.6°F | Resp 16 | Wt 160.0 lb

## 2023-10-07 DIAGNOSIS — C61 Malignant neoplasm of prostate: Secondary | ICD-10-CM

## 2023-10-07 MED ORDER — DENOSUMAB 120 MG/1.7ML ~~LOC~~ SOLN
120.0000 mg | Freq: Once | SUBCUTANEOUS | Status: AC
  Administered 2023-10-07: 120 mg via SUBCUTANEOUS
  Filled 2023-10-07: qty 1.7

## 2023-10-07 MED ORDER — ABIRATERONE ACETATE 250 MG PO TABS
ORAL_TABLET | ORAL | 2 refills | Status: DC
  Filled 2023-10-07: qty 120, fill #0
  Filled 2023-10-22: qty 120, 30d supply, fill #0
  Filled 2023-11-15: qty 120, 30d supply, fill #1
  Filled 2023-12-21: qty 120, 30d supply, fill #2

## 2023-10-07 MED ORDER — LEUPROLIDE ACETATE (3 MONTH) 22.5 MG ~~LOC~~ KIT
22.5000 mg | PACK | Freq: Once | SUBCUTANEOUS | Status: AC
  Administered 2023-10-07: 22.5 mg via SUBCUTANEOUS
  Filled 2023-10-07: qty 22.5

## 2023-10-07 MED ORDER — PREDNISONE 5 MG PO TABS
5.0000 mg | ORAL_TABLET | Freq: Every day | ORAL | 3 refills | Status: DC
  Filled 2023-10-07: qty 90, 90d supply, fill #0
  Filled 2023-10-22: qty 30, 30d supply, fill #0
  Filled 2023-11-15: qty 30, 30d supply, fill #1
  Filled 2023-12-21: qty 30, 30d supply, fill #2
  Filled 2024-01-21: qty 30, 30d supply, fill #3
  Filled 2024-02-21: qty 30, 30d supply, fill #4
  Filled 2024-03-13 – 2024-03-15 (×2): qty 30, 30d supply, fill #5
  Filled 2024-04-10: qty 30, 30d supply, fill #6
  Filled 2024-04-26 – 2024-05-03 (×2): qty 30, 30d supply, fill #7
  Filled 2024-05-23: qty 30, 30d supply, fill #8
  Filled 2024-06-15: qty 30, 30d supply, fill #9
  Filled 2024-07-12 – 2024-07-14 (×3): qty 30, 30d supply, fill #10
  Filled 2024-08-18 – 2024-08-22 (×2): qty 30, 30d supply, fill #11

## 2023-10-07 MED ORDER — OYSTER SHELL CALCIUM W/D 500-5 MG-MCG PO TABS
1.0000 | ORAL_TABLET | Freq: Two times a day (BID) | ORAL | 3 refills | Status: DC
  Filled 2023-10-07 – 2023-10-25 (×2): qty 60, 30d supply, fill #0
  Filled 2023-11-15: qty 60, 30d supply, fill #1
  Filled 2023-12-21: qty 60, 30d supply, fill #2
  Filled 2024-01-21: qty 60, 30d supply, fill #3

## 2023-10-07 NOTE — Progress Notes (Signed)
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  Status post prostatectomy, T2cN0 -The patient continued to have rise in his PSA up to 52 after prostatectomy without any measurable disease.  Lupron was started at that time. Casodex 50 mg daily added in 05/2011. Therapy discontinued in March 2018 because of progression of disease. -Status post radiation therapy to the pelvic bone completed in June 2019 he received 50 Gy in 10 fractions. -currently on Eligard 22.5 mg every 3 months, Zytiga 1000 mg daily with prednisone 5 mg daily since May 2018, and Xgeva 120 mg every 3 months. -He is clinically doing well, no significant pain or other concerns.  Lab reviewed, his PSA has been undetectable, last pet scan showed minimal disease, he has had excellent response to treatment.    Assessment and Plan    Metastatic Prostate Cancer Diagnosed in 2008. Recent PET scan shows a single site of active metastatic disease in the left inferior pubic ramus with increased uptake compared to last year. PSA levels remain low, and other lab results (CBC, CMP) are normal. Discussed nonspecific uptake but noted bone uptake is usually specific. Currently on Zytiga, maintaining low PSA levels. Discussed risks and benefits of additional radiation therapy versus watchful waiting. Patient prefers to avoid radiation and is open to  repeating PET scan in six months if no immediate intervention is needed. - Discuss PET scan results with radiologist and radiation oncologist (Dr. Kathrynn Running) - Consider additional radiation therapy to the left inferior pubic ramus if recommended by Dr. Kathrynn Running - If no radiation, repeat PET scan in six months - Continue Zytiga  General Health Maintenance Reports feeling well with good energy levels and continues to exercise. Participating in 'dry February'. - Encourage continued exercise and healthy lifestyle choices  Plan -I personally reviewed his PET scan images with patient and his wife, and will discuss with radiologist and rad onc Dr. Kathrynn Running to see if he would benefit from additional radiation to the left ramus bone metastasis -Will proceed Eligard and Xgeva injection today and continue every 3 months - follow-up in three months - Call with recommendations from radiologist and radiation oncologist.         SUMMARY OF ONCOLOGIC HISTORY: Oncology History Overview Note   Cancer Staging  Malignant neoplasm of prostate [facility]) Staging form: Prostate, AJCC 7th Edition - Clinical: Stage IIB (T2c, N0, M0) - Signed by Benjiman Core, MD on 10/27/2013     Malignant neoplasm of prostate (HCC)  09/18/2011 Initial Diagnosis   Malignant neoplasm of prostate (HCC)   10/05/2020 Genetic Testing   Negative genetic testing on the CancerNext-Expanded+RNAinsight panel.  The CancerNext-Expanded gene panel offered by [facility] and includes sequencing and rearrangement analysis for the following 77 genes: AIP, ALK, APC*, ATM*, AXIN2, BAP1, BARD1, BLM, BMPR1A, BRCA1*, BRCA2*, BRIP1*, CDC73, CDH1*, CDK4, CDKN1B, CDKN2A, CHEK2*, CTNNA1, DICER1, FANCC, FH, FLCN, GALNT12, KIF1B, LZTR1, MAX, MEN1, MET, MLH1*, MSH2*,  MSH3, MSH6*, MUTYH*, NBN, NF1*, NF2, NTHL1, PALB2*, PHOX2B, PMS2*, POT1, PRKAR1A, PTCH1, PTEN*, RAD51C*, RAD51D*, RB1, RECQL, RET, SDHA, SDHAF2, SDHB, SDHC, SDHD, SMAD4, SMARCA4, SMARCB1,  SMARCE1, STK11, SUFU, TMEM127, TP53*, TSC1, TSC2, VHL and XRCC2 (sequencing and deletion/duplication); EGFR, EGLN1, HOXB13, KIT, MITF, PDGFRA, POLD1, and POLE (sequencing only); EPCAM and GREM1 (deletion/duplication only). DNA and RNA analyses performed for * genes. The report date is October 05, 2020.      Discussed the use of AI scribe software for clinical note transcription with the patient, who gave verbal consent to proceed.  History of Present Illness   The patient, with a history of prostate cancer, presents for a routine follow-up. He reports feeling well overall, with no new symptoms or complaints. He continues to exercise regularly and has good energy levels. He is currently on Zytiga, which has kept his PSA levels low. He has not required any refills of his medications recently. The patient underwent radiation treatment in 2019, which he tolerated well. He has not had any chemotherapy infusions. The patient's partner is present and actively involved in the conversation.         All other systems were reviewed with the patient and are negative.  MEDICAL HISTORY:  Past Medical History:  Diagnosis Date   Family history of breast cancer    Family history of prostate cancer    Prostate cancer (HCC) 08/2006    SURGICAL HISTORY: Past Surgical History:  Procedure Laterality Date   PROSTATE BIOPSY     PROSTATECTOMY     VASECTOMY      I have reviewed the social history and family history with the patient and they are unchanged from previous note.  ALLERGIES:  has no known allergies.  MEDICATIONS:  Current Outpatient Medications  Medication Sig Dispense Refill   abiraterone acetate (ZYTIGA) 250 MG tablet TAKE 4 TABLETS (1000MG ) BY MOUTH DAILY ON AN EMPTY STOMACH 1 HOUR BEFORE OR 2 HOURS AFTER A MEAL 120 tablet 2   amoxicillin-clavulanate (AUGMENTIN) 875-125 MG tablet Take 1 tablet by mouth 2 (two) times daily. 20 tablet 0   Ascorbic Acid (VITAMIN C) 1000 MG tablet Take 1,000  mg by mouth daily.     b complex vitamins tablet Take 1 tablet by mouth daily.     Calcium Carb-Cholecalciferol (OYSTER SHELL CALCIUM W/D) 500-5 MG-MCG TABS Take 1 tablet by mouth 2 (two) times daily. 60 tablet 3   denosumab (XGEVA) 120 MG/1.7ML SOLN injection Inject 120 mg into the skin once.     fluticasone (FLONASE) 50 MCG/ACT nasal spray Place 2 sprays into both nostrils daily. 16 g 6   leuprolide (LUPRON) 22.5 MG injection Inject 22.5 mg into the muscle every 3 (three) months.     loratadine (CLARITIN) 10 MG tablet Take 1 tablet (10 mg total) by mouth daily. 30 tablet 11   Melatonin 10 MG CAPS Take 12 mg by mouth 2 (two) times daily at 8 am and 10 pm.     Multiple Vitamins-Minerals (MULTIVITAMIN PO) Take 1 tablet by mouth daily.     Omega-3 Fatty Acids (FISH OIL) 1000 MG CAPS Take 1,000 mg by mouth daily.     predniSONE (DELTASONE) 5 MG tablet Take 1 tablet (5 mg total) by mouth daily with breakfast. 90 tablet 3   pseudoephedrine (SUDAFED) 120 MG 12 hr tablet Take 1 tablet (120 mg total) by mouth 2 (two) times daily. 20 tablet 0   Saline (SIMPLY SALINE) 0.9 % AERS Place 2 each into the nose  as directed. Use nightly for sinus hygiene long-term.  Can also be used as many times daily as desired to assist with clearing congested sinuses. 127 mL 11   VITAMIN D, ERGOCALCIFEROL, PO Take 1 tablet by mouth daily.     No current facility-administered medications for this visit.    PHYSICAL EXAMINATION: ECOG PERFORMANCE STATUS: 0 - Asymptomatic  Vitals:   10/07/23 0916  BP: 127/65  Pulse: 64  Resp: 16  Temp: 97.6 F (36.4 C)  SpO2: 100%   Wt Readings from Last 3 Encounters:  10/07/23 160 lb (72.6 kg)  09/10/23 157 lb 6.4 oz (71.4 kg)  07/08/23 159 lb 1.6 oz (72.2 kg)     GENERAL:alert, no distress and comfortable SKIN: skin color, texture, turgor are normal, no rashes or significant lesions EYES: normal, Conjunctiva are pink and non-injected, sclera clear NECK: supple, thyroid normal  size, non-tender, without nodularity LYMPH:  no palpable lymphadenopathy in the cervical, axillary  LUNGS: clear to auscultation and percussion with normal breathing effort HEART: regular rate & rhythm and no murmurs and no lower extremity edema ABDOMEN:abdomen soft, non-tender and normal bowel sounds Musculoskeletal:no cyanosis of digits and no clubbing  NEURO: alert & oriented x 3 with fluent speech, no focal motor/sensory deficits   LABORATORY DATA:  I have reviewed the data as listed    Latest Ref Rng & Units 09/30/2023    2:43 PM 07/06/2023    8:18 AM 04/06/2023    9:07 AM  CBC  WBC 4.0 - 10.5 K/uL 5.6  4.9  5.3   Hemoglobin 13.0 - 17.0 g/dL 82.9  56.2  13.0   Hematocrit 39.0 - 52.0 % 42.9  42.4  42.4   Platelets 150 - 400 K/uL 246  229  228         Latest Ref Rng & Units 09/30/2023    2:43 PM 07/06/2023    8:18 AM 04/06/2023    9:07 AM  CMP  Glucose 70 - 99 mg/dL 865  96  80   BUN 8 - 23 mg/dL 17  19  20    Creatinine 0.61 - 1.24 mg/dL 7.84  6.96  2.95   Sodium 135 - 145 mmol/L 141  141  140   Potassium 3.5 - 5.1 mmol/L 4.5  4.3  4.4   Chloride 98 - 111 mmol/L 106  105  106   CO2 22 - 32 mmol/L 30  30  30    Calcium 8.9 - 10.3 mg/dL 28.4  9.8  9.6   Total Protein 6.5 - 8.1 g/dL 7.2  6.8  7.0   Total Bilirubin 0.0 - 1.2 mg/dL 1.5  1.8  2.1   Alkaline Phos 38 - 126 U/L 62  55  58   AST 15 - 41 U/L 21  21  20    ALT 0 - 44 U/L 20  17  17        RADIOGRAPHIC STUDIES: I have personally reviewed the radiological images as listed and agreed with the findings in the report. No results found.    No orders of the defined types were placed in this encounter.  All questions were answered. The patient knows to call the clinic with any problems, questions or concerns. No barriers to learning was detected. The total time spent in the appointment was 25 minutes.     Malachy Mood, MD 10/07/2023

## 2023-10-07 NOTE — Assessment & Plan Note (Signed)
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  Status post prostatectomy, T2cN0 -The patient continued to have rise in his PSA up to 52 after prostatectomy without any measurable disease.  Lupron was started at that time. Casodex 50 mg daily added in 05/2011. Therapy discontinued in March 2018 because of progression of disease. -Status post radiation therapy to the pelvic bone completed in June 2019 he received 50 Gy in 10 fractions. -currently on Eligard 22.5 mg every 3 months, Zytiga 1000 mg daily with prednisone 5 mg daily since May 2018, and Xgeva 120 mg every 3 months. -He is clinically doing well, no significant pain or other concerns.  Lab reviewed, his PSA has been undetectable, last pet scan showed minimal disease, he has had excellent response to treatment.

## 2023-10-12 ENCOUNTER — Other Ambulatory Visit: Payer: Self-pay

## 2023-10-12 NOTE — Progress Notes (Signed)
 Histology and Location of Primary Cancer: Stage IV prostate carcinoma with bone mets  Prostatectomy (2008)  Sites of Visceral and Bony Metastatic Disease: Left inferior pubic ramus  Location(s) of Symptomatic Metastases: Left inferior pubic ramus  09/30/2023 Dr. Malachy Mood NM PET (PSMA) Skull to Mid Thigh CLINICAL DATA: Prostate carcinoma with biochemical recurrence.   IMPRESSION: 1. Single site of active metastatic prostate cancer skeletal metastasis in the LEFT inferior pubic ramus. 2. No evidence of nodal metastasis or visceral metastasis.    Past/Anticipated chemotherapy by medical oncology, if any:  Dr. Malachy Mood   Pain on a scale of 0-10 is:  0/10   If Spine Met(s), symptoms, if any, include: Bowel/Bladder retention or incontinence (please describe): No Numbness or weakness in extremities (please describe):  No Current Decadron regimen, if applicable: No  Ambulatory status? Walker? Wheelchair?: Independent  SAFETY ISSUES: Prior radiation? Yes, pelvic bone (01/2018) Pacemaker/ICD? No Possible current pregnancy? Male Is the patient on methotrexate? No  Current Complaints / other details:

## 2023-10-13 ENCOUNTER — Telehealth: Payer: Self-pay

## 2023-10-15 ENCOUNTER — Other Ambulatory Visit: Payer: Self-pay

## 2023-10-17 NOTE — Progress Notes (Unsigned)
 [facility] CONSULT NOTE  Patient Care Team: Lula Olszewski, MD as PCP - General (Internal Medicine) Billie Ruddy, OD as Referring Physician (Optometry) Malachy Mood, MD as Attending Physician (Hematology and Oncology)  ASSESSMENT & PLAN:  David Huffman is a 63 y.o.male with unremarkable history being seen at [facility] for prostate cancer.  Patient location: home  Physician location: [facility]  Current diagnosis: mCRPC Initial diagnosis: 2008 T2cN0, Gleason 4+3 disease Germline testing: Negative genetic testing on the CancerNext-Expanded+RNAinsight panel.  Somatic testing: Guardant 360 Treatment: ADT/AAP  The patient was counseled on the natural history of prostate cancer and the standard treatment options that are available for prostate cancer. I am unclear if the PET activity is real given her PSA is <0.1 Dr. Kathrynn Running had communicated after the referral to consider MRI. I have ordered this. We will follow after imaging. He is seeing Dr. Kathrynn Running tomorrow.  Malignant neoplasm of prostate Continue ADT/AAP Obtain MRI pelvis for evaluation of iliac lesion. Planning on seeing Dr. Kathrynn Running tomorrow If no clear lesion on MR, consider short term follow up include LDH in about 4-5 weeks.  At risk for side effect of medication Supportive last bone mineral density study was in 2020 with osteoporosis calcium (1000-1200 mg daily from food and supplements) and vitamin D3 (1000 IU daily) Has been on Xgeva. Will continue Routine dental care Prevent diabetes Aggressive cardiovascular risk management Weight-bearing exercises (30 minutes per day) Limit alcohol consumption and avoid smoking   Orders Placed This Encounter  Procedures   MR Pelvis W Contrast    Standing Status:   Future    Expected Date:   10/25/2023    Expiration Date:   10/17/2024    Reason for Exam (SYMPTOM  OR DIAGNOSIS REQUIRED):   eval for the avid PSMA PET  LEFT inferior pubic ramus lesion if  metastasis? PSA <0.1    If indicated for the ordered procedure, I authorize the administration of contrast media per Radiology protocol:   Yes    What is the patient's sedation requirement?:   No Sedation    Does the patient have a pacemaker or implanted devices?:   No    Preferred imaging location?:   [facility] (table limit - 550 lbs)   The total time spent in the appointment was 30 minutes encounter with patients including review of chart and various tests results, discussions about plan of care and coordination of care plan  All questions were answered. The patient knows to call the clinic with any problems, questions or concerns. No barriers to learning was detected.  Melven Sartorius, MD 2/24/202510:41 PM  CHIEF COMPLAINTS/PURPOSE OF CONSULTATION:  Prostate cancer  HISTORY OF PRESENTING ILLNESS:  David Huffman 63 y.o. male is being follow up through phone visit at his request because of prostate cancer.  I have reviewed his chart and materials related to his cancer extensively and collaborated history with the patient. Summary of oncologic history is as follows:  Records show he was orgininally diagnosed of prostate cancer in March 2008 with Gleason score 7 and PSA of 31. He is status post prostatectomy done in 2008 with final pathology revealing stage T2cN0, Gleason 4+3 disease. Post-operatively the PSA continued to rise, up to as high as 52. He was started on ADT with Lupron, given every 3 months. Our record showed PSA nadir at 0.15 on 02/18/2009. PSA was 2.42 on 06/17/11 and records showed he started ADT with Casodex in January 2013.  Lowest  PSA was 0.65 in 05/2012. He continued therapy with Lupron and Casodex. His PSA started to rise and as of June 2018 it was 5.7.  He was switched to [facility] in May 2018. His bone scan from March 2018 showed areas of increased activity in the pelvis. He had an MRI of the right hip on 12/04/2016 which showed metastatic lesions in the right pubic  body and the right inferior and superior pubic rami. 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   He underwent radiation and completed in June 2019.  The target lesion in the right pubis and inferior pubic ramus were treated to 50 Gy in 5 fractions of 10 Gy and his PSA continues to be trending down, less than detectable as of 08/2019.  He continues on AAP with ADT and Xgeva. He has good PS and mainly wants to discuss recent PET findings today.  Oncology History Overview Note   Cancer Staging  Malignant neoplasm of prostate [facility]) Staging form: Prostate, AJCC 7th Edition - Clinical: Stage IIB (T2c, N0, M0) - Signed by Benjiman Core, MD on 10/27/2013     Malignant neoplasm of prostate [facility])  10/2006 Initial Diagnosis   Malignant neoplasm of prostate (HCC) Gleason score 7 and PSA of 31   2008 Surgery   Prostatectomy  pT2cN0, Gleason 4+3 disease. Rising PSA post-op to 52.   03/03/2007 Tumor Marker   PSA 39.89   02/18/2009 Tumor Marker   PSA 0.15. nadir   10/05/2020 Genetic Testing   Negative genetic testing on the CancerNext-Expanded+RNAinsight panel.  The CancerNext-Expanded gene panel offered by [facility] and includes sequencing and rearrangement analysis for the following 77 genes: AIP, ALK, APC*, ATM*, AXIN2, BAP1, BARD1, BLM, BMPR1A, BRCA1*, BRCA2*, BRIP1*, CDC73, CDH1*, CDK4, CDKN1B, CDKN2A, CHEK2*, CTNNA1, DICER1, FANCC, FH, FLCN, GALNT12, KIF1B, LZTR1, MAX, MEN1, MET, MLH1*, MSH2*, MSH3, MSH6*, MUTYH*, NBN, NF1*, NF2, NTHL1, PALB2*, PHOX2B, PMS2*, POT1, PRKAR1A, PTCH1, PTEN*, RAD51C*, RAD51D*, RB1, RECQL, RET, SDHA, SDHAF2, SDHB, SDHC, SDHD, SMAD4, SMARCA4, SMARCB1, SMARCE1, STK11, SUFU, TMEM127, TP53*, TSC1, TSC2, VHL and XRCC2 (sequencing and deletion/duplication); EGFR, EGLN1, HOXB13, KIT, MITF, PDGFRA,  POLD1, and POLE (sequencing only); EPCAM and GREM1 (deletion/duplication only). DNA and RNA analyses performed for * genes. The report date is October 05, 2020.   09/23/2021 Imaging   BONE SCAN There are new foci of increased tracer uptake in the lower lumbar spine and 1 of the right upper ribs. Findings suggest possible skeletal metastatic disease. Please correlate with laboratory findings.   09/23/2021 Imaging   CT CAP IMPRESSION: 1. Status post prostatectomy. 2. 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 3. No CT evidence of new osseous or soft tissue metastatic disease in the chest, abdomen, or pelvis. 4. Unchanged sclerotic fractures of the bilateral posterior ribs.   08/28/2022 PET scan   PSMA PET Stable CT appearance of sclerotic bone metastases involving the right pubis and inferior pubic ramus, which show mild radiotracer uptake.   Small focus of radiotracer uptake in the left inferior pubis without CT correlate, but suspicious for bone metastasis.   09/30/2023 PET scan   PSMA PET 1. Single site of active metastatic prostate cancer skeletal metastasis in the LEFT inferior pubic ramus. 2. No evidence of nodal metastasis or visceral metastasis.   09/30/2023 Tumor Marker  PSA<0.1 SINCE 09/19/19       MEDICAL HISTORY:  Past Medical History:  Diagnosis Date   Family history of breast cancer    Family history of prostate cancer    Prostate cancer (HCC) 08/2006    SURGICAL HISTORY: Past Surgical History:  Procedure Laterality Date   PROSTATE BIOPSY     PROSTATECTOMY     VASECTOMY      SOCIAL HISTORY: Social History   Socioeconomic History   Marital status: Married    Spouse name: Not on file   Number of children: Not on file   Years of education: Not on file   Highest education level: Not on file  Occupational History   Not on file  Tobacco Use   Smoking status: Never   Smokeless tobacco: Never   Vaping Use   Vaping status: Never Used  Substance and Sexual Activity   Alcohol use: Yes    Comment: social   Drug use: Never   Sexual activity: Not Currently  Other Topics Concern   Not on file  Social History Narrative   L[place] to [place]. Works for [employer]. Active runs and rides mountain bikes. Two children: 1 boy, 1 girl.    Social Drivers of Corporate investment banker Strain: Not on file  Food Insecurity: Not on file  Transportation Needs: Not on file  Physical Activity: Not on file  Stress: Not on file  Social Connections: Not on file  Intimate Partner Violence: Not on file    FAMILY HISTORY: Family History  Problem Relation Age of Onset   Cancer Father 90       prostate   Leukemia Father        CLL   Brain cancer Father    Cancer Paternal Grandfather        prostate   Cancer Paternal Uncle        prostate   Lymphoma Mother    Diabetes Maternal Aunt    Clotting disorder Maternal Grandmother    Other Maternal Grandfather        heart problem due to rheumatic fever   Breast cancer Paternal Grandmother 49   Cancer Paternal Uncle        tumor on his spine    ALLERGIES:  has no known allergies.  MEDICATIONS:  Current Outpatient Medications  Medication Sig Dispense Refill   abiraterone acetate (ZYTIGA) 250 MG tablet TAKE 4 TABLETS (1000MG ) BY MOUTH DAILY ON AN EMPTY STOMACH 1 HOUR BEFORE OR 2 HOURS AFTER A MEAL 120 tablet 2   amoxicillin-clavulanate (AUGMENTIN) 875-125 MG tablet Take 1 tablet by mouth 2 (two) times daily. 20 tablet 0   Ascorbic Acid (VITAMIN C) 1000 MG tablet Take 1,000 mg by mouth daily.     b complex vitamins tablet Take 1 tablet by mouth daily.     Calcium Carb-Cholecalciferol (OYSTER SHELL CALCIUM W/D) 500-5 MG-MCG TABS Take 1 tablet by mouth 2 (two) times daily. 60 tablet 3   denosumab (XGEVA) 120 MG/1.7ML SOLN injection Inject 120 mg into the skin once.     fluticasone (FLONASE) 50 MCG/ACT nasal spray Place 2 sprays into  both nostrils daily. 16 g 6   leuprolide (LUPRON) 22.5 MG injection Inject 22.5 mg into the muscle every 3 (three) months.     loratadine (CLARITIN) 10 MG tablet Take 1 tablet (10 mg total) by mouth daily. 30 tablet 11   Melatonin 10 MG CAPS Take 12 mg by mouth 2 (two) times daily  at 8 am and 10 pm.     Multiple Vitamins-Minerals (MULTIVITAMIN PO) Take 1 tablet by mouth daily.     Omega-3 Fatty Acids (FISH OIL) 1000 MG CAPS Take 1,000 mg by mouth daily.     predniSONE (DELTASONE) 5 MG tablet Take 1 tablet (5 mg total) by mouth daily with breakfast. 90 tablet 3   pseudoephedrine (SUDAFED) 120 MG 12 hr tablet Take 1 tablet (120 mg total) by mouth 2 (two) times daily. 20 tablet 0   Saline (SIMPLY SALINE) 0.9 % AERS Place 2 each into the nose as directed. Use nightly for sinus hygiene long-term.  Can also be used as many times daily as desired to assist with clearing congested sinuses. 127 mL 11   VITAMIN D, ERGOCALCIFEROL, PO Take 1 tablet by mouth daily.     No current facility-administered medications for this visit.    REVIEW OF SYSTEMS:   All relevant systems were reviewed with the patient and are negative.  PHYSICAL EXAMINATION: ECOG PERFORMANCE STATUS: 0 - Asymptomatic  NA  LABORATORY DATA:  I have reviewed the data as listed Lab Results  Component Value Date   WBC 5.6 09/30/2023   HGB 14.7 09/30/2023   HCT 42.9 09/30/2023   MCV 88.1 09/30/2023   PLT 246 09/30/2023   Recent Labs    04/06/23 0907 07/06/23 0818 09/30/23 1443  NA 140 141 141  K 4.4 4.3 4.5  CL 106 105 106  CO2 30 30 30   GLUCOSE 80 96 108*  BUN 20 19 17   CREATININE 1.00 1.07 1.04  CALCIUM 9.6 9.8 10.1  GFRNONAA >60 >60 >60  PROT 7.0 6.8 7.2  ALBUMIN 4.3 4.3 4.4  AST 20 21 21   ALT 17 17 20   ALKPHOS 58 55 62  BILITOT 2.1* 1.8* 1.5*    RADIOGRAPHIC STUDIES: I have personally reviewed the radiological images as listed and agreed with the findings in the report. NM PET (PSMA) SKULL TO MID  THIGH Result Date: 10/04/2023 CLINICAL DATA:  Prostate carcinoma with biochemical recurrence. EXAM: NUCLEAR MEDICINE PET SKULL BASE TO THIGH TECHNIQUE: 8.5 mCi Flotufolastat (Posluma) was injected intravenously. Full-ring PET imaging was performed from the skull base to thigh after the radiotracer. CT data was obtained and used for attenuation correction and anatomic localization. COMPARISON:  PSMA PET scan 08/28/2022 FINDINGS: NECK No radiotracer activity in neck lymph nodes. Incidental CT finding: None. CHEST No radiotracer accumulation within mediastinal or hilar lymph nodes. No suspicious pulmonary nodules on the CT scan. Incidental CT finding: Benign pulmonary cyst in the LEFT lower lobe. ABDOMEN/PELVIS Prostate: No focal activity in prostatectomy bed. Lymph nodes: No abnormal radiotracer accumulation within pelvic or abdominal nodes. Liver: No evidence of liver metastasis. Incidental CT finding: None. SKELETON Focus of intense radiotracer activity in the LEFT inferior pubic ramus with SUV max equal 9.8 is increased in activity from comparison exam ( SUV max equal 2.8). No CT correlation for the lesion (image 198). Sclerotic lesions in the RIGHT ischium are unchanged without significant radiotracer activity. Very mild radiotracer activity in the LEFT parasymphysis ischium is nonspecific. No additional sites of radiotracer activity within the skeleton. IMPRESSION: 1. Single site of active metastatic prostate cancer skeletal metastasis in the LEFT inferior pubic ramus. 2. No evidence of nodal metastasis or visceral metastasis. Electronically Signed   By: Genevive Bi M.D.   On: 10/04/2023 09:54

## 2023-10-18 ENCOUNTER — Other Ambulatory Visit: Payer: Self-pay

## 2023-10-18 ENCOUNTER — Inpatient Hospital Stay (HOSPITAL_BASED_OUTPATIENT_CLINIC_OR_DEPARTMENT_OTHER): Payer: BC Managed Care – PPO

## 2023-10-18 DIAGNOSIS — Z9189 Other specified personal risk factors, not elsewhere classified: Secondary | ICD-10-CM | POA: Insufficient documentation

## 2023-10-18 DIAGNOSIS — C61 Malignant neoplasm of prostate: Secondary | ICD-10-CM

## 2023-10-18 DIAGNOSIS — M899 Disorder of bone, unspecified: Secondary | ICD-10-CM

## 2023-10-18 NOTE — Assessment & Plan Note (Signed)
 Supportive last bone mineral density study was in 2020 with osteoporosis calcium (1000-1200 mg daily from food and supplements) and vitamin D3 (1000 IU daily) Has been on Xgeva. Will continue Routine dental care Prevent diabetes Aggressive cardiovascular risk management Weight-bearing exercises (30 minutes per day) Limit alcohol consumption and avoid smoking

## 2023-10-18 NOTE — Assessment & Plan Note (Signed)
 Continue ADT/AAP Obtain MRI pelvis for evaluation of iliac lesion. Planning on seeing Dr. Kathrynn Running tomorrow If no clear lesion on MR, consider short term follow up include LDH in about 4-5 weeks.

## 2023-10-18 NOTE — Progress Notes (Signed)
 Radiation Oncology         [phone] ________________________________  Outpatient Consultation  Name: David Huffman MRN: [id number]  Date of Service: 10/19/2023 DOB: March 05, 1961  CC:Lula Olszewski, MD  Malachy Mood, MD   REFERRING PHYSICIAN: Malachy Mood, MD  DIAGNOSIS: There were no encounter diagnoses.  No diagnosis found.  HISTORY OF PRESENT ILLNESS: David Huffman is a 63 y.o. male seen at the request of Dr. Mosetta Putt for metastatic prostate cancer. We previously met the patient in 11/2017 for the same diagnosis. In summary, he was initially diagnosed with Gleason 7 prostate cancer in 2008 with a PSA of 31. He underwent prostatectomy in 10/2006 showing stage pT2cN0 Gleason 4+3 disease. His postop PSA remained significantly elevated at 29.32 and rose to 52 by 04/2007. He was started on ADT with Lupron at that time. PSA reached a nadir of 0.4 but rose to 2.42 by 05/2011. He was started on Casodex at that time, and continued ADT. A recurrent rise in PSA to 4.7 in 10/2016 prompted a bone scan and subsequent MRI that showed metastatic lesions in the pelvis. His Casodex was changed to Zytiga in 12/2016, but his PSA only dropped to 2.9 before rising again. Repeat scans in 09/2017 showed persistent and stable disease in the pelvis, prompting referral to [place]. The areas of disease in the right pubis and inferior pubic ramus were treated with SBRT in 01/2018. His PSA became undetectable in 08/2019.  Since that time, he has continued on ADT, currently with Eligard, and Zytiga, with Xgeva every 3 months for bone health. His most recent PSMA PET scan from 09/30/23 showed a single site of active metastatic disease in left inferior pubic ramus. His PSA has remained undetectable. At follow up with Dr. Mosetta Putt on 10/07/23, he denied any pain to the area. ***  PREVIOUS RADIATION THERAPY: Yes  01/26/18 - 02/04/18: The target lesion in the right pubis and inferior pubic ramus were treated to 50 Gy in 5 fractions of 10 Gy (SBRT)  PAST  MEDICAL HISTORY:  Past Medical History:  Diagnosis Date   Family history of breast cancer    Family history of prostate cancer    Prostate cancer (HCC) 08/2006      PAST SURGICAL HISTORY: Past Surgical History:  Procedure Laterality Date   PROSTATE BIOPSY     PROSTATECTOMY     VASECTOMY      FAMILY HISTORY:  Family History  Problem Relation Age of Onset   Cancer Father 91       prostate   Leukemia Father        CLL   Brain cancer Father    Cancer Paternal Grandfather        prostate   Cancer Paternal Uncle        prostate   Lymphoma Mother    Diabetes Maternal Aunt    Clotting disorder Maternal Grandmother    Other Maternal Grandfather        heart problem due to rheumatic fever   Breast cancer Paternal Grandmother 20   Cancer Paternal Uncle        tumor on his spine    SOCIAL HISTORY:  Social History   Socioeconomic History   Marital status: Married    Spouse name: Not on file   Number of children: Not on file   Years of education: Not on file   Highest education level: Not on file  Occupational History   Not on file  Tobacco Use   Smoking  status: Never   Smokeless tobacco: Never  Vaping Use   Vaping status: Never Used  Substance and Sexual Activity   Alcohol use: Yes    Comment: social   Drug use: Never   Sexual activity: Not Currently  Other Topics Concern   Not on file  Social History Narrative   L[place] to downtown [place]. Works for [employer]. Active runs and rides mountain bikes. Two children: 1 boy, 1 girl.    Social Drivers of Corporate investment banker Strain: Not on file  Food Insecurity: Not on file  Transportation Needs: Not on file  Physical Activity: Not on file  Stress: Not on file  Social Connections: Not on file  Intimate Partner Violence: Not on file    ALLERGIES: Patient has no known allergies.  MEDICATIONS:  Current Outpatient Medications  Medication Sig Dispense Refill   abiraterone acetate (ZYTIGA) 250 MG tablet  TAKE 4 TABLETS (1000MG ) BY MOUTH DAILY ON AN EMPTY STOMACH 1 HOUR BEFORE OR 2 HOURS AFTER A MEAL 120 tablet 2   amoxicillin-clavulanate (AUGMENTIN) 875-125 MG tablet Take 1 tablet by mouth 2 (two) times daily. 20 tablet 0   Ascorbic Acid (VITAMIN C) 1000 MG tablet Take 1,000 mg by mouth daily.     b complex vitamins tablet Take 1 tablet by mouth daily.     Calcium Carb-Cholecalciferol (OYSTER SHELL CALCIUM W/D) 500-5 MG-MCG TABS Take 1 tablet by mouth 2 (two) times daily. 60 tablet 3   denosumab (XGEVA) 120 MG/1.7ML SOLN injection Inject 120 mg into the skin once.     fluticasone (FLONASE) 50 MCG/ACT nasal spray Place 2 sprays into both nostrils daily. 16 g 6   leuprolide (LUPRON) 22.5 MG injection Inject 22.5 mg into the muscle every 3 (three) months.     loratadine (CLARITIN) 10 MG tablet Take 1 tablet (10 mg total) by mouth daily. 30 tablet 11   Melatonin 10 MG CAPS Take 12 mg by mouth 2 (two) times daily at 8 am and 10 pm.     Multiple Vitamins-Minerals (MULTIVITAMIN PO) Take 1 tablet by mouth daily.     Omega-3 Fatty Acids (FISH OIL) 1000 MG CAPS Take 1,000 mg by mouth daily.     predniSONE (DELTASONE) 5 MG tablet Take 1 tablet (5 mg total) by mouth daily with breakfast. 90 tablet 3   pseudoephedrine (SUDAFED) 120 MG 12 hr tablet Take 1 tablet (120 mg total) by mouth 2 (two) times daily. 20 tablet 0   Saline (SIMPLY SALINE) 0.9 % AERS Place 2 each into the nose as directed. Use nightly for sinus hygiene long-term.  Can also be used as many times daily as desired to assist with clearing congested sinuses. 127 mL 11   VITAMIN D, ERGOCALCIFEROL, PO Take 1 tablet by mouth daily.     No current facility-administered medications for this encounter.    REVIEW OF SYSTEMS:  On review of systems, the patient reports that he is doing well overall. He denies any chest pain, shortness of breath, cough, fevers, chills, night sweats, unintended weight changes. He denies any bowel or bladder disturbances,  and denies abdominal pain, nausea or vomiting. He denies any new musculoskeletal or joint aches or pains.*** A complete review of systems is obtained and is otherwise negative.    PHYSICAL EXAM:  Wt Readings from Last 3 Encounters:  10/07/23 160 lb (72.6 kg)  09/10/23 157 lb 6.4 oz (71.4 kg)  07/08/23 159 lb 1.6 oz (72.2 kg)   Temp  Readings from Last 3 Encounters:  10/07/23 97.6 F (36.4 C) (Temporal)  09/10/23 (!) 97.1 F (36.2 C) (Temporal)  07/08/23 (!) 97.5 F (36.4 C) (Oral)   BP Readings from Last 3 Encounters:  10/07/23 127/65  09/10/23 120/78  07/08/23 123/78   Pulse Readings from Last 3 Encounters:  10/07/23 64  09/10/23 64  07/08/23 76    /10  In general this is a well appearing *** man in no acute distress. He's alert and oriented x4 and appropriate throughout the examination. Cardiopulmonary assessment is negative for acute distress and he exhibits normal effort.     KPS = ***  100 - Normal; no complaints; no evidence of disease. 90   - Able to carry on normal activity; minor signs or symptoms of disease. 80   - Normal activity with effort; some signs or symptoms of disease. 82   - Cares for self; unable to carry on normal activity or to do active work. 60   - Requires occasional assistance, but is able to care for most of his personal needs. 50   - Requires considerable assistance and frequent medical care. 40   - Disabled; requires special care and assistance. 30   - Severely disabled; hospital admission is indicated although death not imminent. 20   - Very sick; hospital admission necessary; active supportive treatment necessary. 10   - Moribund; fatal processes progressing rapidly. 0     - Dead  Karnofsky DA, Abelmann WH, Craver LS and Burchenal JH [phone]) The use of the nitrogen mustards in the palliative treatment of carcinoma: with particular reference to bronchogenic carcinoma Cancer 1 634-56  LABORATORY DATA:  Lab Results  Component Value Date    WBC 5.6 09/30/2023   HGB 14.7 09/30/2023   HCT 42.9 09/30/2023   MCV 88.1 09/30/2023   PLT 246 09/30/2023   Lab Results  Component Value Date   NA 141 09/30/2023   K 4.5 09/30/2023   CL 106 09/30/2023   CO2 30 09/30/2023   Lab Results  Component Value Date   ALT 20 09/30/2023   AST 21 09/30/2023   ALKPHOS 62 09/30/2023   BILITOT 1.5 (H) 09/30/2023     RADIOGRAPHY: NM PET (PSMA) SKULL TO MID THIGH Result Date: 10/04/2023 CLINICAL DATA:  Prostate carcinoma with biochemical recurrence. EXAM: NUCLEAR MEDICINE PET SKULL BASE TO THIGH TECHNIQUE: 8.5 mCi Flotufolastat (Posluma) was injected intravenously. Full-ring PET imaging was performed from the skull base to thigh after the radiotracer. CT data was obtained and used for attenuation correction and anatomic localization. COMPARISON:  PSMA PET scan 08/28/2022 FINDINGS: NECK No radiotracer activity in neck lymph nodes. Incidental CT finding: None. CHEST No radiotracer accumulation within mediastinal or hilar lymph nodes. No suspicious pulmonary nodules on the CT scan. Incidental CT finding: Benign pulmonary cyst in the LEFT lower lobe. ABDOMEN/PELVIS Prostate: No focal activity in prostatectomy bed. Lymph nodes: No abnormal radiotracer accumulation within pelvic or abdominal nodes. Liver: No evidence of liver metastasis. Incidental CT finding: None. SKELETON Focus of intense radiotracer activity in the LEFT inferior pubic ramus with SUV max equal 9.8 is increased in activity from comparison exam ( SUV max equal 2.8). No CT correlation for the lesion (image 198). Sclerotic lesions in the RIGHT ischium are unchanged without significant radiotracer activity. Very mild radiotracer activity in the LEFT parasymphysis ischium is nonspecific. No additional sites of radiotracer activity within the skeleton. IMPRESSION: 1. Single site of active metastatic prostate cancer skeletal metastasis in the LEFT inferior  pubic ramus. 2. No evidence of nodal metastasis  or visceral metastasis. Electronically Signed   By: Genevive Bi M.D.   On: 10/04/2023 09:54      IMPRESSION/PLAN: 1. 63 y.o. man with single site of active metastatic disease in left inferior pubic ramus from metastatic prostate cancer***  Today, we talked to the patient and family about the findings and workup thus far. We reviewed the role of radiotherapy in the management of osseous metastatic disease. We discussed the available radiation techniques, and focused on the details and logistics of delivery. We reviewed the anticipated acute and late sequelae associated with radiation in this setting. The patient was encouraged to ask questions that were answered to his satisfaction.  At the end of our discussion, the patient ***   I personally spent *** minutes in this encounter including chart review, reviewing radiological studies, meeting face-to-face with the patient, entering orders and completing documentation.    Marguarite Arbour, PA-C    Margaretmary Dys, MD  [facility]  Radiation Oncology Direct Dial: [phone]  Fax: [phone] [email or web address]  Skype  LinkedIn   This document serves as a record of services personally performed by Margaretmary Dys, MD and Marcello Fennel, PA-C. It was created on their behalf by Mickie Bail, a trained medical scribe. The creation of this record is based on the scribe's personal observations and the provider's statements to them. This document has been checked and approved by the attending provider.

## 2023-10-19 ENCOUNTER — Encounter: Payer: Self-pay | Admitting: Radiation Oncology

## 2023-10-19 ENCOUNTER — Encounter: Payer: Self-pay | Admitting: Hematology

## 2023-10-19 ENCOUNTER — Ambulatory Visit
Admission: RE | Admit: 2023-10-19 | Discharge: 2023-10-19 | Disposition: A | Discharge status: Home / Self Care | Payer: BC Managed Care – PPO | Source: Ambulatory Visit | Attending: Radiation Oncology | Admitting: Radiation Oncology

## 2023-10-19 ENCOUNTER — Telehealth: Payer: Self-pay

## 2023-10-19 VITALS — BP 126/85 | HR 63 | Temp 97.8°F | Resp 18 | Ht 68.0 in | Wt 157.8 lb

## 2023-10-19 DIAGNOSIS — C7951 Secondary malignant neoplasm of bone: Secondary | ICD-10-CM | POA: Insufficient documentation

## 2023-10-19 DIAGNOSIS — Z8042 Family history of malignant neoplasm of prostate: Secondary | ICD-10-CM | POA: Diagnosis not present

## 2023-10-19 DIAGNOSIS — C61 Malignant neoplasm of prostate: Secondary | ICD-10-CM

## 2023-10-19 DIAGNOSIS — Z803 Family history of malignant neoplasm of breast: Secondary | ICD-10-CM | POA: Diagnosis not present

## 2023-10-19 DIAGNOSIS — Z923 Personal history of irradiation: Secondary | ICD-10-CM | POA: Diagnosis not present

## 2023-10-19 DIAGNOSIS — Z806 Family history of leukemia: Secondary | ICD-10-CM | POA: Insufficient documentation

## 2023-10-19 DIAGNOSIS — Z7952 Long term (current) use of systemic steroids: Secondary | ICD-10-CM | POA: Insufficient documentation

## 2023-10-19 NOTE — Telephone Encounter (Signed)
 Marland Kitchen

## 2023-10-19 NOTE — Telephone Encounter (Signed)
 Rescheduled appointment times per patient's request

## 2023-10-22 ENCOUNTER — Ambulatory Visit (HOSPITAL_COMMUNITY)
Admission: RE | Admit: 2023-10-22 | Discharge: 2023-10-22 | Disposition: A | Discharge status: Home / Self Care | Payer: BC Managed Care – PPO | Source: Ambulatory Visit

## 2023-10-22 ENCOUNTER — Other Ambulatory Visit: Payer: Self-pay

## 2023-10-22 ENCOUNTER — Other Ambulatory Visit (HOSPITAL_COMMUNITY): Payer: Self-pay

## 2023-10-22 ENCOUNTER — Other Ambulatory Visit: Payer: Self-pay | Admitting: Pharmacy Technician

## 2023-10-22 DIAGNOSIS — M899 Disorder of bone, unspecified: Secondary | ICD-10-CM | POA: Insufficient documentation

## 2023-10-22 DIAGNOSIS — C61 Malignant neoplasm of prostate: Secondary | ICD-10-CM | POA: Insufficient documentation

## 2023-10-22 MED ORDER — GADOBUTROL 1 MMOL/ML IV SOLN
7.0000 mL | Freq: Once | INTRAVENOUS | Status: AC | PRN
Start: 2023-10-22 — End: 2023-10-22
  Administered 2023-10-22: 7 mL via INTRAVENOUS

## 2023-10-22 NOTE — Progress Notes (Signed)
 Specialty Pharmacy Initial Fill Coordination Note  Jas Betten is a 63 y.o. male contacted today regarding refills of specialty medication(s) Abiraterone Acetate (ZYTIGA) .  Patient requested Delivery  on 10/26/23  to verified address [address]   [place] [address]   Medication will be filled on 10/25/23.   Patient is aware of $5 copayment.

## 2023-10-25 ENCOUNTER — Encounter: Payer: Self-pay | Admitting: Hematology

## 2023-10-25 ENCOUNTER — Other Ambulatory Visit: Payer: Self-pay

## 2023-10-26 ENCOUNTER — Encounter: Payer: Self-pay | Admitting: Hematology

## 2023-10-26 ENCOUNTER — Other Ambulatory Visit: Payer: Self-pay

## 2023-10-28 ENCOUNTER — Inpatient Hospital Stay: Payer: BC Managed Care – PPO

## 2023-11-05 ENCOUNTER — Encounter: Payer: Self-pay | Admitting: Radiation Oncology

## 2023-11-05 NOTE — Progress Notes (Signed)
  Radiation Oncology         [phone] ________________________________  Name: David Huffman MRN: [id number]  Date: 11/05/2023  DOB: 1961/02/27  Chart Note:  I reviewed this patient's most recent findings and wanted to take a minute to document my impression.  I saw this patient on February 25 regarding a new left inferior pubic ramus lesion.  Since his PSA remains undetectable, we obtained an MRI to further characterize the lesion.  His pelvic MRI does confirm a metastatic deposit in the left inferior pubic ramus.  The MRI also demonstrates lesions in the right pubic symphysis and rami which reflect previously treated radiation targets that were not active on PSMA PET scan.  I think the MRI simply confirms quiescent lesions on the right with a new solitary spot of prostate cancer in the left inferior pubic ramus.  It is difficult to accurately define MRI findings in the setting but giving the patient the benefit of the doubt, he appears to have a solitary deposit of oligometastatic disease which is active.  Based on this, I would recommend salvage SBRT to the left inferior pubic ramus.  I discussed the findings with Dr. Cherly Hensen and will look forward to seeing the patient back soon to review the findings and potentially proceed with SBRT. ________________________________  Artist Pais. Kathrynn Running, M.D.

## 2023-11-08 ENCOUNTER — Other Ambulatory Visit: Payer: Self-pay

## 2023-11-15 ENCOUNTER — Other Ambulatory Visit: Payer: Self-pay

## 2023-11-15 ENCOUNTER — Other Ambulatory Visit (HOSPITAL_COMMUNITY): Payer: Self-pay

## 2023-11-15 ENCOUNTER — Other Ambulatory Visit: Payer: Self-pay | Admitting: Pharmacy Technician

## 2023-11-15 NOTE — Progress Notes (Signed)
 Specialty Pharmacy Refill Coordination Note  David Huffman is a 63 y.o. male contacted today regarding refills of specialty medication(s) Abiraterone Acetate (ZYTIGA)   Patient requested Delivery   Delivery date: 11/23/23   Verified address: [address] [place]   Medication will be filled on 11/22/23.

## 2023-11-16 ENCOUNTER — Ambulatory Visit
Admission: RE | Admit: 2023-11-16 | Discharge: 2023-11-16 | Disposition: A | Discharge status: Home / Self Care | Source: Ambulatory Visit | Attending: Urology | Admitting: Urology

## 2023-11-16 DIAGNOSIS — C61 Malignant neoplasm of prostate: Secondary | ICD-10-CM | POA: Diagnosis present

## 2023-11-16 DIAGNOSIS — C7951 Secondary malignant neoplasm of bone: Secondary | ICD-10-CM | POA: Insufficient documentation

## 2023-11-16 NOTE — Progress Notes (Signed)
  Radiation Oncology         [phone] ________________________________  Name: David Huffman MRN: [id number]  Date: 11/16/2023  DOB: 1960-11-30  STEREOTACTIC BODY RADIOTHERAPY SIMULATION AND TREATMENT PLANNING NOTE    ICD-10-CM   1. Malignant neoplasm metastatic to bone [facility])  C79.51       DIAGNOSIS:  63 yo man with an isolated left inferior pubic ramus oligometastasis from prostate cancer  NARRATIVE:  The patient was brought to the CT Simulation planning suite.  Identity was confirmed.  All relevant records and images related to the planned course of therapy were reviewed.  The patient freely provided informed written consent to proceed with treatment after reviewing the details related to the planned course of therapy. The consent form was witnessed and verified by the simulation staff.  Then, the patient was set-up in a stable reproducible  supine position for radiation therapy.  A BodyFix immobilization pillow was fabricated for reproducible positioning.  Surface markings were placed.  The CT images were loaded into the planning software.  The gross target volumes (GTV) and planning target volumes (PTV) were delinieated, and avoidance structures were contoured.  Treatment planning then occurred.  The radiation prescription was entered and confirmed.  A total of two complex treatment devices were fabricated in the form of the BodyFix immobilization pillow and a neck accuform cushion.  I have requested : 3D Simulation  I have requested a DVH of the following structures: targets and all normal structures near the target including bladder, femoral heads skin and others as noted on the radiation plan to maintain doses in adherence with established limits  SPECIAL TREATMENT PROCEDURE:  The planned course of therapy using radiation constitutes a special treatment procedure. Special care is required in the management of this patient for the following reasons. High dose per fraction requiring special  monitoring for increased toxicities of treatment including daily imaging..  The special nature of the planned course of radiotherapy will require increased physician supervision and oversight to ensure patient's safety with optimal treatment outcomes.    This requires extended time and effort.    PLAN:  The patient will receive 40 Gy in 5 fractions.  ________________________________  Artist Pais Kathrynn Running, M.D.

## 2023-11-22 ENCOUNTER — Other Ambulatory Visit: Payer: Self-pay

## 2023-11-22 ENCOUNTER — Encounter: Payer: Self-pay | Admitting: Hematology

## 2023-11-22 DIAGNOSIS — C7951 Secondary malignant neoplasm of bone: Secondary | ICD-10-CM | POA: Diagnosis not present

## 2023-11-26 ENCOUNTER — Other Ambulatory Visit: Payer: BC Managed Care – PPO

## 2023-11-29 ENCOUNTER — Ambulatory Visit: Payer: BC Managed Care – PPO

## 2023-11-29 ENCOUNTER — Other Ambulatory Visit: Payer: Self-pay

## 2023-11-29 ENCOUNTER — Ambulatory Visit
Admission: RE | Admit: 2023-11-29 | Discharge: 2023-11-29 | Disposition: A | Discharge status: Home / Self Care | Source: Ambulatory Visit | Attending: Radiation Oncology | Admitting: Radiation Oncology

## 2023-11-29 DIAGNOSIS — C7951 Secondary malignant neoplasm of bone: Secondary | ICD-10-CM | POA: Insufficient documentation

## 2023-11-29 LAB — RAD ONC ARIA SESSION SUMMARY
Course Elapsed Days: 0
Plan Fractions Treated to Date: 1
Plan Prescribed Dose Per Fraction: 8 Gy
Plan Total Fractions Prescribed: 5
Plan Total Prescribed Dose: 40 Gy
Reference Point Dosage Given to Date: 8 Gy
Reference Point Session Dosage Given: 8 Gy
Session Number: 1

## 2023-11-30 ENCOUNTER — Ambulatory Visit: Admitting: Radiation Oncology

## 2023-12-01 ENCOUNTER — Other Ambulatory Visit: Payer: Self-pay

## 2023-12-01 ENCOUNTER — Ambulatory Visit
Admission: RE | Admit: 2023-12-01 | Discharge: 2023-12-01 | Disposition: A | Discharge status: Home / Self Care | Source: Ambulatory Visit | Attending: Radiation Oncology | Admitting: Radiation Oncology

## 2023-12-01 DIAGNOSIS — C7951 Secondary malignant neoplasm of bone: Secondary | ICD-10-CM | POA: Diagnosis not present

## 2023-12-01 LAB — RAD ONC ARIA SESSION SUMMARY
Course Elapsed Days: 2
Plan Fractions Treated to Date: 2
Plan Prescribed Dose Per Fraction: 8 Gy
Plan Total Fractions Prescribed: 5
Plan Total Prescribed Dose: 40 Gy
Reference Point Dosage Given to Date: 16 Gy
Reference Point Session Dosage Given: 8 Gy
Session Number: 2

## 2023-12-02 ENCOUNTER — Ambulatory Visit: Admitting: Radiation Oncology

## 2023-12-03 ENCOUNTER — Ambulatory Visit
Admission: RE | Admit: 2023-12-03 | Discharge: 2023-12-03 | Disposition: A | Discharge status: Home / Self Care | Source: Ambulatory Visit | Attending: Radiation Oncology | Admitting: Radiation Oncology

## 2023-12-03 ENCOUNTER — Other Ambulatory Visit: Payer: Self-pay

## 2023-12-03 DIAGNOSIS — C7951 Secondary malignant neoplasm of bone: Secondary | ICD-10-CM | POA: Diagnosis not present

## 2023-12-03 LAB — RAD ONC ARIA SESSION SUMMARY
Course Elapsed Days: 4
Plan Fractions Treated to Date: 3
Plan Prescribed Dose Per Fraction: 8 Gy
Plan Total Fractions Prescribed: 5
Plan Total Prescribed Dose: 40 Gy
Reference Point Dosage Given to Date: 24 Gy
Reference Point Session Dosage Given: 8 Gy
Session Number: 3

## 2023-12-07 ENCOUNTER — Ambulatory Visit
Admission: RE | Admit: 2023-12-07 | Discharge: 2023-12-07 | Disposition: A | Discharge status: Home / Self Care | Source: Ambulatory Visit | Attending: Radiation Oncology | Admitting: Radiation Oncology

## 2023-12-07 ENCOUNTER — Other Ambulatory Visit: Payer: Self-pay

## 2023-12-07 DIAGNOSIS — C7951 Secondary malignant neoplasm of bone: Secondary | ICD-10-CM | POA: Diagnosis not present

## 2023-12-07 LAB — RAD ONC ARIA SESSION SUMMARY
Course Elapsed Days: 8
Plan Fractions Treated to Date: 4
Plan Prescribed Dose Per Fraction: 8 Gy
Plan Total Fractions Prescribed: 5
Plan Total Prescribed Dose: 40 Gy
Reference Point Dosage Given to Date: 32 Gy
Reference Point Session Dosage Given: 8 Gy
Session Number: 4

## 2023-12-08 ENCOUNTER — Other Ambulatory Visit: Payer: Self-pay

## 2023-12-09 ENCOUNTER — Ambulatory Visit
Admission: RE | Admit: 2023-12-09 | Discharge: 2023-12-09 | Disposition: A | Discharge status: Home / Self Care | Source: Ambulatory Visit | Attending: Radiation Oncology | Admitting: Radiation Oncology

## 2023-12-09 ENCOUNTER — Other Ambulatory Visit: Payer: Self-pay

## 2023-12-09 DIAGNOSIS — C7951 Secondary malignant neoplasm of bone: Secondary | ICD-10-CM | POA: Diagnosis not present

## 2023-12-09 LAB — RAD ONC ARIA SESSION SUMMARY
Course Elapsed Days: 10
Plan Fractions Treated to Date: 5
Plan Prescribed Dose Per Fraction: 8 Gy
Plan Total Fractions Prescribed: 5
Plan Total Prescribed Dose: 40 Gy
Reference Point Dosage Given to Date: 40 Gy
Reference Point Session Dosage Given: 8 Gy
Session Number: 5

## 2023-12-10 NOTE — Radiation Completion Notes (Addendum)
  Radiation Oncology         [phone] ________________________________  Name: David Huffman MRN: [id number]  Date: 12/09/2023  DOB: 1960-12-01  Referring Physician: Scherrie Curt, M.D. Date of Service: 2023-12-10 Radiation Oncologist: Bartholome Ligas, M.D. [facility]     RADIATION ONCOLOGY END OF TREATMENT NOTE     Diagnosis: 63 yo man with an isolated left inferior pubic ramus oligometastasis from prostate cancer   Intent: Curative     ==========DELIVERED PLANS==========  First Treatment Date: 2023-11-29 Last Treatment Date: 2023-12-09   Plan Name: Pelvis_[email or web address] Site: Pubis, Left Technique: SBRT/SRT-IMRT Mode: Photon Dose Per Fraction: 8 Gy Prescribed Dose (Delivered / Prescribed): 40 Gy / 40 Gy Prescribed Fxs (Delivered / Prescribed): 5 / 5     ==========ON TREATMENT VISIT DATES========== 2023-11-29, 2023-12-01, 2023-12-03, 2023-12-07, 2023-12-09, 2023-12-09    See weekly On Treatment Notes in Epic for details in the Media tab (listed as Progress notes on the On Treatment Visit Dates listed above).  He tolerated the tree well with only modest fatigue.  The patient will receive a call in about one month from the radiation oncology department. He will continue follow up with his medical oncologist, Dr. Alita Irwin, as well.  ------------------------------------------------   Kenith Payer, MD [facility]  Radiation Oncology Direct Dial: [phone]  Fax: [phone] .com  Skype  LinkedIn

## 2023-12-13 ENCOUNTER — Other Ambulatory Visit: Payer: Self-pay

## 2023-12-15 ENCOUNTER — Other Ambulatory Visit: Payer: Self-pay

## 2023-12-21 ENCOUNTER — Telehealth: Payer: Self-pay | Admitting: Pharmacy Technician

## 2023-12-21 ENCOUNTER — Other Ambulatory Visit: Payer: Self-pay

## 2023-12-21 ENCOUNTER — Other Ambulatory Visit (HOSPITAL_COMMUNITY): Payer: Self-pay

## 2023-12-21 ENCOUNTER — Encounter: Payer: Self-pay | Admitting: Hematology

## 2023-12-21 NOTE — Telephone Encounter (Signed)
 Clinical questions have been answered and PA submitted. PA currently Pending.

## 2023-12-21 NOTE — Progress Notes (Signed)
 Specialty Pharmacy Refill Coordination Note  David Huffman is a 63 y.o. male contacted today regarding refills of specialty medication(s) Abiraterone  Acetate (ZYTIGA )   Patient requested Delivery   Delivery date: 12/22/23   Verified address: [address] [place]   Medication will be filled on 04.29.25.

## 2023-12-21 NOTE — Telephone Encounter (Signed)
 Oral Oncology Patient Advocate Encounter   Received notification that prior authorization for Zytiga  is due for renewal   PA started on 12/21/23 Key [id number] Waiting on clinical questions to populate.      Roda Cirri, CPhT Specialty Pharmacy Patient Advocate Phone: [phone] Fax: [phone]

## 2023-12-21 NOTE — Progress Notes (Signed)
 PA required for zytiga . Patient aware, mail as soon as approved. Routed to [facility]

## 2023-12-21 NOTE — Progress Notes (Signed)
 Specialty Pharmacy Ongoing Clinical Assessment Note  David Huffman is a 63 y.o. male who is being followed by the specialty pharmacy service for RxSp Oncology   Patient's specialty medication(s) reviewed today: Abiraterone  Acetate (ZYTIGA )   Missed doses in the last 4 weeks: 0   Patient/Caregiver did not have any additional questions or concerns.   Therapeutic benefit summary: Patient is achieving benefit   Adverse events/side effects summary: Experienced adverse events/side effects (continued, tolerable fatigue)   Patient's therapy is appropriate to: Continue    Goals Addressed             This Visit's Progress    Slow Disease Progression       Patient is on track. Patient will maintain adherence.  PSA remains <0.1 ng/mL as of 09/30/23.         Follow up:  6 months  [name] M Janesia Joswick Specialty Pharmacist

## 2023-12-21 NOTE — Progress Notes (Signed)
 PA request has been Received. New Encounter has been or will be created for follow up. For additional info see Pharmacy Prior Auth telephone encounter from 12/21/23.

## 2023-12-22 ENCOUNTER — Other Ambulatory Visit: Payer: Self-pay

## 2023-12-22 ENCOUNTER — Encounter: Payer: Self-pay | Admitting: Hematology

## 2023-12-22 ENCOUNTER — Other Ambulatory Visit (HOSPITAL_COMMUNITY): Payer: Self-pay

## 2023-12-22 NOTE — Telephone Encounter (Signed)
 Oral Oncology Patient Advocate Encounter  Prior Authorization for Abiraterone  has been approved.    PA#   Effective dates: 12/21/23 through 12/20/24  Patients co-pay is $5.00. Patient may continue to fill through [facility] as they have been.    Hansel Ley, CPhT Pharmacy Technician Coordinator [facility] [phone] (Ph) 12/22/2023 9:44 AM

## 2023-12-28 ENCOUNTER — Inpatient Hospital Stay: Payer: BC Managed Care – PPO | Attending: Oncology

## 2023-12-28 DIAGNOSIS — Z79899 Other long term (current) drug therapy: Secondary | ICD-10-CM | POA: Diagnosis not present

## 2023-12-28 DIAGNOSIS — Z5111 Encounter for antineoplastic chemotherapy: Secondary | ICD-10-CM | POA: Diagnosis present

## 2023-12-28 DIAGNOSIS — C7951 Secondary malignant neoplasm of bone: Secondary | ICD-10-CM | POA: Diagnosis present

## 2023-12-28 DIAGNOSIS — C61 Malignant neoplasm of prostate: Secondary | ICD-10-CM | POA: Insufficient documentation

## 2023-12-28 LAB — CMP (CANCER CENTER ONLY)
ALT: 24 U/L (ref 0–44)
AST: 29 U/L (ref 15–41)
Albumin: 4.4 g/dL (ref 3.5–5.0)
Alkaline Phosphatase: 56 U/L (ref 38–126)
Anion gap: 5 (ref 5–15)
BUN: 19 mg/dL (ref 8–23)
CO2: 30 mmol/L (ref 22–32)
Calcium: 9.7 mg/dL (ref 8.9–10.3)
Chloride: 106 mmol/L (ref 98–111)
Creatinine: 1 mg/dL (ref 0.61–1.24)
GFR, Estimated: >60 mL/min (ref >60)
Glucose, Bld: 84 mg/dL (ref 70–99)
Potassium: 4.3 mmol/L (ref 3.5–5.1)
Sodium: 141 mmol/L (ref 135–145)
Total Bilirubin: 1.6 mg/dL — ABNORMAL HIGH (ref 0.0–1.2)
Total Protein: 7.1 g/dL (ref 6.5–8.1)

## 2023-12-28 LAB — CBC WITH DIFFERENTIAL (CANCER CENTER ONLY)
Abs Immature Granulocytes: 0.01 10*3/uL (ref 0.00–0.07)
Basophils Absolute: 0 10*3/uL (ref 0.0–0.1)
Basophils Relative: 0 %
Eosinophils Absolute: 0.2 10*3/uL (ref 0.0–0.5)
Eosinophils Relative: 3 %
HCT: 42.3 % (ref 39.0–52.0)
Hemoglobin: 14.9 g/dL (ref 13.0–17.0)
Immature Granulocytes: 0 %
Lymphocytes Relative: 36 %
Lymphs Abs: 1.7 10*3/uL (ref 0.7–4.0)
MCH: 30.3 pg (ref 26.0–34.0)
MCHC: 35.2 g/dL (ref 30.0–36.0)
MCV: 86 fL (ref 80.0–100.0)
Monocytes Absolute: 0.5 10*3/uL (ref 0.1–1.0)
Monocytes Relative: 10 %
Neutro Abs: 2.4 10*3/uL (ref 1.7–7.7)
Neutrophils Relative %: 51 %
Platelet Count: 213 10*3/uL (ref 150–400)
RBC: 4.92 MIL/uL (ref 4.22–5.81)
RDW: 12.7 % (ref 11.5–15.5)
WBC Count: 4.8 10*3/uL (ref 4.0–10.5)
nRBC: 0 % (ref 0.0–0.2)

## 2023-12-28 LAB — LACTATE DEHYDROGENASE: LDH: 167 U/L (ref 98–192)

## 2023-12-29 LAB — TESTOSTERONE: Testosterone: <3 ng/dL — ABNORMAL LOW (ref 264–916)

## 2023-12-29 LAB — PROSTATE-SPECIFIC AG, SERUM (LABCORP): Prostate Specific Ag, Serum: <0.1 ng/mL (ref 0.0–4.0)

## 2023-12-30 ENCOUNTER — Ambulatory Visit: Payer: BC Managed Care – PPO | Admitting: Hematology

## 2023-12-30 ENCOUNTER — Ambulatory Visit: Payer: BC Managed Care – PPO

## 2023-12-30 ENCOUNTER — Inpatient Hospital Stay: Payer: BC Managed Care – PPO

## 2023-12-30 ENCOUNTER — Inpatient Hospital Stay (HOSPITAL_BASED_OUTPATIENT_CLINIC_OR_DEPARTMENT_OTHER): Payer: BC Managed Care – PPO

## 2023-12-30 VITALS — BP 120/72 | HR 61 | Temp 97.3°F | Resp 16 | Wt 155.4 lb

## 2023-12-30 DIAGNOSIS — C61 Malignant neoplasm of prostate: Secondary | ICD-10-CM

## 2023-12-30 DIAGNOSIS — Z8639 Personal history of other endocrine, nutritional and metabolic disease: Secondary | ICD-10-CM | POA: Diagnosis not present

## 2023-12-30 DIAGNOSIS — M810 Age-related osteoporosis without current pathological fracture: Secondary | ICD-10-CM | POA: Diagnosis not present

## 2023-12-30 DIAGNOSIS — Z9189 Other specified personal risk factors, not elsewhere classified: Secondary | ICD-10-CM

## 2023-12-30 MED ORDER — LEUPROLIDE ACETATE (3 MONTH) 22.5 MG ~~LOC~~ KIT
22.5000 mg | PACK | Freq: Once | SUBCUTANEOUS | Status: AC
  Administered 2023-12-30: 22.5 mg via SUBCUTANEOUS
  Filled 2023-12-30: qty 22.5

## 2023-12-30 NOTE — Assessment & Plan Note (Addendum)
 Supportive last bone mineral density study was in 2020 with osteoporosis calcium (1000-1200 mg daily from food and supplements) and vitamin D3 (1000 IU daily) Has been on Xgeva. Will continue Routine dental care Prevent diabetes Aggressive cardiovascular risk management Weight-bearing exercises (30 minutes per day) Limit alcohol consumption and avoid smoking

## 2023-12-30 NOTE — Assessment & Plan Note (Addendum)
 Continue ADT/AAP Labs in about 3 months.

## 2023-12-30 NOTE — Progress Notes (Signed)
  [facility] OFFICE PROGRESS NOTE  Patient Care Team: Anthon Kins, MD as PCP - General (Internal Medicine) Suzie Estelle, OD as Referring Physician (Optometry) Sonja Lynn Haven, MD as Attending Physician (Hematology and Oncology)  David Huffman is a 63 y.o.male with unremarkable history being [facility] for prostate cancer.   Current diagnosis: mCRPC Initial diagnosis: 2008 T2cN0, Gleason 4+3 disease Germline testing: Negative genetic testing on the CancerNext-Expanded+RNAinsight panel.  Somatic testing: Guardant 360 Treatment: ADT/AAP Radiation: 01/26/18 - 02/04/18: The target lesion in the right pubis and inferior pubic ramus were treated to 50 Gy in 5 fractions of 10 Gy (SBRT)   Last lupron  12/30/23 at 22.5 mg  4/7-4/07/25 SBRT to left inferior pubic ramus.   Discussed lab and imaging results.  His PET and MRI showed solitary metastases while his PSA was <0.1.  concerning that PSA is not pick up oligometastasis early to allow local therapy, will continue monitor in conjunction with PET scan.  Discussed osteoporosis management.  Will obtain restaging PET next visit to evaluation for treatment response.  He does not see PCP routinely. Advised to. He would like to get diabetes and lipid monitoring with next blood draw here.  Assessment & Plan Malignant neoplasm of prostate (HCC) Continue ADT/AAP Labs in about 3 months. Osteoporosis, unspecified osteoporosis type, unspecified pathological fracture presence Continue Xgeva  every 6 months Bone density  At risk for side effect of medication Supportive last bone mineral density study was in 2020 with osteoporosis calcium  (1000-1200 mg daily from food and supplements) and vitamin D3 (1000 IU daily) Has been on Xgeva . Will continue Routine dental care Prevent diabetes Aggressive cardiovascular risk management Weight-bearing exercises (30 minutes per day) Limit alcohol consumption and avoid smoking History  of high cholesterol Obtain Lipid panel next visit Cardiovascular risk factor Heart healthy diet Exercises Monitor for A1c  Total time: I spent a total of 41 minutes including review of chart and various tests results, face-to-face time with the patient, discussions about results, plan of care and management of other conditions at risk.  Orders Placed This Encounter  Procedures   DG Bone Density    Standing Status:   Future    Expected Date:   01/13/2024    Expiration Date:   12/29/2024    Reason for Exam (SYMPTOM  OR DIAGNOSIS REQUIRED):   osteoporosis follow up    Preferred imaging location?:   [facility]   CMP (Cancer Center only)    Standing Status:   Future    Expiration Date:   12/29/2024   CBC with Differential (Cancer Center Only)    Standing Status:   Future    Expiration Date:   12/29/2024   Lactate dehydrogenase    Standing Status:   Future    Expiration Date:   12/29/2024   Prostate-Specific AG, Serum    Standing Status:   Future    Expiration Date:   12/29/2024   Testosterone     Standing Status:   Future    Expiration Date:   12/29/2024   CEA (Access)    Standing Status:   Future    Expiration Date:   12/29/2024   Lipid panel    Standing Status:   Future    Expiration Date:   12/29/2024   Hemoglobin A1c    Standing Status:   Future    Expiration Date:   12/29/2024     [name], MD  INTERVAL HISTORY: Patient returns for follow-up. No pain. Recovered  from radiation well. Some fatigue resolved. No hot flashes on AAP.  Oncology History Overview Note   Cancer Staging  Malignant neoplasm of prostate [facility]) Staging form: Prostate, AJCC 7th Edition - Clinical: Stage IIB (T2c, N0, M0) - Signed by Renna Cary, MD on 10/27/2013     Malignant neoplasm of prostate [facility])  10/2006 Initial Diagnosis   Malignant neoplasm of prostate (HCC) Gleason score 7 and PSA of 31   2008 Surgery   Prostatectomy  pT2cN0, Gleason 4+3 disease. Rising PSA post-op to 52.    03/03/2007 Tumor Marker   PSA 39.89   02/18/2009 Tumor Marker   PSA 0.15. nadir   10/05/2020 Genetic Testing   Negative genetic testing on the CancerNext-Expanded+RNAinsight panel.  The CancerNext-Expanded gene panel offered by [facility] and includes sequencing and rearrangement analysis for the following 77 genes: AIP, ALK, APC*, ATM*, AXIN2, BAP1, BARD1, BLM, BMPR1A, BRCA1*, BRCA2*, BRIP1*, CDC73, CDH1*, CDK4, CDKN1B, CDKN2A, CHEK2*, CTNNA1, DICER1, FANCC, FH, FLCN, GALNT12, KIF1B, LZTR1, MAX, MEN1, MET, MLH1*, MSH2*, MSH3, MSH6*, MUTYH*, NBN, NF1*, NF2, NTHL1, PALB2*, PHOX2B, PMS2*, POT1, PRKAR1A, PTCH1, PTEN*, RAD51C*, RAD51D*, RB1, RECQL, RET, SDHA, SDHAF2, SDHB, SDHC, SDHD, SMAD4, SMARCA4, SMARCB1, SMARCE1, STK11, SUFU, TMEM127, TP53*, TSC1, TSC2, VHL and XRCC2 (sequencing and deletion/duplication); EGFR, EGLN1, HOXB13, KIT, MITF, PDGFRA, POLD1, and POLE (sequencing only); EPCAM and GREM1 (deletion/duplication only). DNA and RNA analyses performed for * genes. The report date is October 05, 2020.   09/23/2021 Imaging   BONE SCAN There are new foci of increased tracer uptake in the lower lumbar spine and 1 of the right upper ribs. Findings suggest possible skeletal metastatic disease. Please correlate with laboratory findings.   09/23/2021 Imaging   CT CAP IMPRESSION: 1. Status post prostatectomy. 2. 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 3. No CT evidence of new osseous or soft tissue metastatic disease in the chest, abdomen, or pelvis. 4. Unchanged sclerotic fractures of the bilateral posterior ribs.   08/28/2022 PET scan   PSMA PET Stable CT appearance of sclerotic bone metastases involving the right pubis and inferior pubic ramus, which show mild radiotracer uptake.   Small focus of radiotracer uptake in the left inferior pubis without CT correlate, but suspicious for bone metastasis.   09/30/2023 PET scan    PSMA PET 1. Single site of active metastatic prostate cancer skeletal metastasis in the LEFT inferior pubic ramus. 2. No evidence of nodal metastasis or visceral metastasis.   09/30/2023 Tumor Marker   PSA<0.1 SINCE 09/19/19     10/22/2023 Imaging   MRI pelvis Metastatic lesions in the right pubic body, right inferior pubic ramus, and left pubic body. The lesion in the left inferior pubic ramus is the most hypermetabolic on prior PMSA PET-CT.      PHYSICAL EXAMINATION: ECOG PERFORMANCE STATUS: 0 - Asymptomatic  Vitals:   12/30/23 0937  BP: 120/72  Pulse: 61  Resp: 16  Temp: (!) 97.3 F (36.3 C)  SpO2: 100%   Filed Weights   12/30/23 0937  Weight: 155 lb 6.4 oz (70.5 kg)    GENERAL: alert, no distress and comfortable SKIN: skin color normal and no bruising or jaundice on exposed skin NECK: No palpable mass LYMPH:  no palpable cervical lymphadenopathy  LUNGS: clear to auscultation and percussion with normal breathing effort HEART: regular rate & rhythm  ABDOMEN: abdomen soft, non-tender and nondistended. Musculoskeletal: no edema  Relevant data reviewed during this visit included labs.

## 2024-01-07 NOTE — Progress Notes (Addendum)
  Radiation Oncology         [phone] ________________________________  Name: Mona Thommen MRN: [id number]  Date of Service: 01/07/2024  DOB: 02-19-1961  Post Treatment Telephone Note  Diagnosis:  C79.51 Secondary malignant neoplasm of bone (as documented in provider EOT note)  The patient was available for call today.  The patient did notnote fatigue during radiation. The patient did not note skin changes in the field of radiation during therapy. The patient has noticed improvement in pain in the area(s) treated with radiation. The patient states "He is NOT taking dexamethasone." The patient does not have symptoms of  weakness or loss of control of the extremities. The patient does not have symptoms of headache. The patient does not have symptoms of seizure or uncontrolled movement. The patient does not have symptoms of changes in vision. The patient does not have changes in speech. The patient does not have confusion.   The patient is scheduled for ongoing care with Dr. Alita Irwin in medical oncology. The patient was encouraged to call if he develops concerns or questions regarding radiation.   This concludes the interaction.  Avery Bodo, LPN

## 2024-01-11 ENCOUNTER — Other Ambulatory Visit: Payer: Self-pay

## 2024-01-11 ENCOUNTER — Ambulatory Visit
Admission: RE | Admit: 2024-01-11 | Discharge: 2024-01-11 | Disposition: A | Discharge status: Home / Self Care | Source: Ambulatory Visit | Attending: Internal Medicine | Admitting: Internal Medicine

## 2024-01-13 ENCOUNTER — Other Ambulatory Visit: Payer: Self-pay

## 2024-01-18 ENCOUNTER — Other Ambulatory Visit: Payer: Self-pay

## 2024-01-20 NOTE — Addendum Note (Signed)
 Encounter addended by: Keitha Pata, PA-C on: 01/20/2024 4:03 PM  Actions taken: Clinical Note Signed

## 2024-01-21 ENCOUNTER — Other Ambulatory Visit: Payer: Self-pay | Admitting: Hematology

## 2024-01-21 ENCOUNTER — Other Ambulatory Visit: Payer: Self-pay

## 2024-01-21 DIAGNOSIS — C61 Malignant neoplasm of prostate: Secondary | ICD-10-CM

## 2024-01-21 MED ORDER — ABIRATERONE ACETATE 250 MG PO TABS
ORAL_TABLET | ORAL | 2 refills | Status: DC
  Filled 2024-01-21: qty 120, 30d supply, fill #0
  Filled 2024-02-15 – 2024-02-21 (×3): qty 120, 30d supply, fill #1
  Filled 2024-03-13 – 2024-03-15 (×2): qty 120, 30d supply, fill #2

## 2024-01-21 NOTE — Progress Notes (Signed)
 Specialty Pharmacy Refill Coordination Note  David Huffman is a 63 y.o. male contacted today regarding refills of specialty medication(s) Abiraterone  Acetate (ZYTIGA )   Patient requested Delivery   Delivery date: 01/26/24   Verified address: [address]   [place] [address]   Medication will be filled on 01/25/24.   This fill date is pending response to refill request from provider. Patient is aware and if they have not received fill by intended date they must follow up with pharmacy.

## 2024-01-25 ENCOUNTER — Encounter: Payer: Self-pay | Admitting: Hematology

## 2024-01-25 ENCOUNTER — Other Ambulatory Visit: Payer: Self-pay

## 2024-02-15 ENCOUNTER — Other Ambulatory Visit: Payer: Self-pay

## 2024-02-17 ENCOUNTER — Other Ambulatory Visit: Payer: Self-pay

## 2024-02-21 ENCOUNTER — Other Ambulatory Visit: Payer: Self-pay

## 2024-02-21 ENCOUNTER — Other Ambulatory Visit (HOSPITAL_COMMUNITY): Payer: Self-pay

## 2024-02-21 NOTE — Progress Notes (Signed)
 Specialty Pharmacy Refill Coordination Note  David Huffman is a 63 y.o. male contacted today regarding refills of specialty medication(s) Abiraterone  Acetate (ZYTIGA )   Patient requested Delivery   Delivery date: 02/23/24   Verified address: [address]  [place] [address]   Medication will be filled on 02/22/24.

## 2024-03-13 ENCOUNTER — Other Ambulatory Visit: Payer: Self-pay

## 2024-03-15 ENCOUNTER — Other Ambulatory Visit: Payer: Self-pay

## 2024-03-15 ENCOUNTER — Other Ambulatory Visit: Payer: Self-pay | Admitting: Pharmacy Technician

## 2024-03-15 DIAGNOSIS — C61 Malignant neoplasm of prostate: Secondary | ICD-10-CM

## 2024-03-15 NOTE — Progress Notes (Signed)
 Specialty Pharmacy Refill Coordination Note  David Huffman is a 63 y.o. male contacted today regarding refills of specialty medication(s) Abiraterone  Acetate (ZYTIGA )   Patient requested Delivery   Delivery date: 03/22/24   Verified address: [address]  [place] [address]   Medication will be filled on 03/21/24.

## 2024-03-16 ENCOUNTER — Encounter: Payer: Self-pay | Admitting: Hematology

## 2024-03-16 ENCOUNTER — Other Ambulatory Visit: Payer: Self-pay

## 2024-03-16 ENCOUNTER — Other Ambulatory Visit (HOSPITAL_COMMUNITY): Payer: Self-pay

## 2024-03-16 MED ORDER — OYSTER SHELL CALCIUM W/D 500-5 MG-MCG PO TABS
1.0000 | ORAL_TABLET | Freq: Two times a day (BID) | ORAL | 3 refills | Status: DC
  Filled 2024-03-16: qty 60, 30d supply, fill #0
  Filled 2024-04-26: qty 60, 30d supply, fill #1
  Filled 2024-05-23: qty 60, 30d supply, fill #2
  Filled 2024-07-17: qty 60, 30d supply, fill #3

## 2024-03-17 ENCOUNTER — Other Ambulatory Visit: Payer: Self-pay

## 2024-03-17 ENCOUNTER — Other Ambulatory Visit (HOSPITAL_COMMUNITY): Payer: Self-pay

## 2024-03-21 ENCOUNTER — Other Ambulatory Visit: Payer: Self-pay

## 2024-03-27 ENCOUNTER — Other Ambulatory Visit (HOSPITAL_COMMUNITY): Payer: Self-pay

## 2024-04-04 ENCOUNTER — Inpatient Hospital Stay: Attending: Oncology

## 2024-04-04 DIAGNOSIS — C7951 Secondary malignant neoplasm of bone: Secondary | ICD-10-CM | POA: Insufficient documentation

## 2024-04-04 DIAGNOSIS — Z9079 Acquired absence of other genital organ(s): Secondary | ICD-10-CM | POA: Insufficient documentation

## 2024-04-04 DIAGNOSIS — Z5111 Encounter for antineoplastic chemotherapy: Secondary | ICD-10-CM | POA: Insufficient documentation

## 2024-04-04 DIAGNOSIS — R978 Other abnormal tumor markers: Secondary | ICD-10-CM | POA: Insufficient documentation

## 2024-04-04 DIAGNOSIS — Z9189 Other specified personal risk factors, not elsewhere classified: Secondary | ICD-10-CM

## 2024-04-04 DIAGNOSIS — R9721 Rising PSA following treatment for malignant neoplasm of prostate: Secondary | ICD-10-CM | POA: Diagnosis not present

## 2024-04-04 DIAGNOSIS — M810 Age-related osteoporosis without current pathological fracture: Secondary | ICD-10-CM | POA: Diagnosis not present

## 2024-04-04 DIAGNOSIS — C61 Malignant neoplasm of prostate: Secondary | ICD-10-CM | POA: Diagnosis present

## 2024-04-04 DIAGNOSIS — E7849 Other hyperlipidemia: Secondary | ICD-10-CM | POA: Insufficient documentation

## 2024-04-04 DIAGNOSIS — Z8639 Personal history of other endocrine, nutritional and metabolic disease: Secondary | ICD-10-CM

## 2024-04-04 LAB — LIPID PANEL
Cholesterol: 201 mg/dL — ABNORMAL HIGH (ref 0–200)
HDL: 74 mg/dL (ref >40)
LDL Cholesterol: 112 mg/dL — ABNORMAL HIGH (ref 0–99)
Total CHOL/HDL Ratio: 2.7 ratio
Triglycerides: 75 mg/dL (ref <150)
VLDL: 15 mg/dL (ref 0–40)

## 2024-04-04 LAB — CBC WITH DIFFERENTIAL (CANCER CENTER ONLY)
Abs Immature Granulocytes: 0.01 K/uL (ref 0.00–0.07)
Basophils Absolute: 0 K/uL (ref 0.0–0.1)
Basophils Relative: 1 %
Eosinophils Absolute: 0.2 K/uL (ref 0.0–0.5)
Eosinophils Relative: 3 %
HCT: 42.4 % (ref 39.0–52.0)
Hemoglobin: 14.6 g/dL (ref 13.0–17.0)
Immature Granulocytes: 0 %
Lymphocytes Relative: 41 %
Lymphs Abs: 1.9 K/uL (ref 0.7–4.0)
MCH: 30.3 pg (ref 26.0–34.0)
MCHC: 34.4 g/dL (ref 30.0–36.0)
MCV: 88 fL (ref 80.0–100.0)
Monocytes Absolute: 0.5 K/uL (ref 0.1–1.0)
Monocytes Relative: 11 %
Neutro Abs: 2.1 K/uL (ref 1.7–7.7)
Neutrophils Relative %: 44 %
Platelet Count: 238 K/uL (ref 150–400)
RBC: 4.82 MIL/uL (ref 4.22–5.81)
RDW: 12.5 % (ref 11.5–15.5)
WBC Count: 4.8 K/uL (ref 4.0–10.5)
nRBC: 0 % (ref 0.0–0.2)

## 2024-04-04 LAB — CMP (CANCER CENTER ONLY)
ALT: 17 U/L (ref 0–44)
AST: 20 U/L (ref 15–41)
Albumin: 4.5 g/dL (ref 3.5–5.0)
Alkaline Phosphatase: 61 U/L (ref 38–126)
Anion gap: 6 (ref 5–15)
BUN: 21 mg/dL (ref 8–23)
CO2: 29 mmol/L (ref 22–32)
Calcium: 9.6 mg/dL (ref 8.9–10.3)
Chloride: 105 mmol/L (ref 98–111)
Creatinine: 0.98 mg/dL (ref 0.61–1.24)
GFR, Estimated: >60 mL/min (ref >60)
Glucose, Bld: 96 mg/dL (ref 70–99)
Potassium: 4.2 mmol/L (ref 3.5–5.1)
Sodium: 140 mmol/L (ref 135–145)
Total Bilirubin: 1.4 mg/dL — ABNORMAL HIGH (ref 0.0–1.2)
Total Protein: 7.2 g/dL (ref 6.5–8.1)

## 2024-04-04 LAB — CEA (ACCESS): CEA (CHCC): 1.3 ng/mL (ref 0.00–5.00)

## 2024-04-04 LAB — LACTATE DEHYDROGENASE: LDH: 136 U/L (ref 98–192)

## 2024-04-05 LAB — HEMOGLOBIN A1C
Hgb A1c MFr Bld: 5.1 % (ref 4.8–5.6)
Mean Plasma Glucose: 100 mg/dL

## 2024-04-05 LAB — PROSTATE-SPECIFIC AG, SERUM (LABCORP): Prostate Specific Ag, Serum: <0.1 ng/mL (ref 0.0–4.0)

## 2024-04-05 LAB — TESTOSTERONE: Testosterone: <3 ng/dL — ABNORMAL LOW (ref 264–916)

## 2024-04-05 NOTE — Progress Notes (Signed)
 [facility] OFFICE PROGRESS NOTE  Patient Care Team: Jesus [name] MATSU, MD as PCP - General (Internal Medicine) Darcie Fallow, OD as Referring Physician (Optometry) Lanny Callander, MD as Attending Physician (Hematology and Oncology)  David is a 63 y.o.male with unremarkable history being seen at [facility] for prostate cancer.    Current diagnosis: mCRPC Initial diagnosis: 2008 T2cN0, Gleason 4+3 disease Germline testing: Negative genetic testing on the CancerNext-Expanded+RNAinsight panel.  Somatic testing: Guardant 360 Treatment: ADT/AAP Radiation: 01/26/18 - 02/04/18: The target lesion in the right pubis and inferior pubic ramus were treated to 50 Gy in 5 fractions of 10 Gy (SBRT)  4/7-4/07/25 SBRT to left inferior pubic ramus.  Last lupron  12/30/23 at 22.5 mg. Will get it today.  His PET and MRI showed solitary metastases while his PSA was <0.1. concerning that PSA is not pick up oligometastasis early to allow local therapy, will continue monitor in conjunction with PET scan.  Patient would like to postpone PET scan if able.  Will monitor with tumor markers.  Due for both Xgeva  and Lupron  today. Assessment & Plan Malignant neoplasm of prostate (HCC) Continue ADT/AAP Labs in about 3 months.  Adding LDH and CEA. Repeat Lupron  about 11/14 and follow-up with lab and me. PSMA PET will be ordered next time after discussed today At risk for side effect of medication Supportive last bone mineral density study was in 2020 with osteoporosis calcium  (1000-1200 mg daily from food and supplements) and vitamin D3 (1000 IU daily) Has been on Xgeva . Will continue. Due today Routine dental care Prevent diabetes Aggressive cardiovascular risk management Weight-bearing exercises (30 minutes per day) Limit alcohol consumption and avoid smoking Other hyperlipidemia Patient wants to avoid statin Will take fish oil consistently Reduce saturated red meat.  Orders Placed This  Encounter  Procedures   CBC with Differential (Cancer Center Only)    Standing Status:   Future    Expiration Date:   04/06/2025   CMP (Cancer Center only)    Standing Status:   Future    Expiration Date:   04/06/2025   Lactate dehydrogenase    Standing Status:   Future    Expiration Date:   04/06/2025   PSA    Standing Status:   Future    Expiration Date:   04/06/2025   Testosterone     Standing Status:   Future    Expiration Date:   04/06/2025   CEA (Access)    Standing Status:   Future    Expiration Date:   04/06/2025     [name], MD  INTERVAL HISTORY: Patient returns for follow-up. Overall feeling well. No bone pain or new symptoms.  Exercising with no limitations.  Still working for [employer].  Oncology History Overview Note   Cancer Staging  Malignant neoplasm of prostate [facility]) Staging form: Prostate, AJCC 7th Edition - Clinical: Stage IIB (T2c, N0, M0) - Signed by Amadeo Windell SAILOR, MD on 10/27/2013     Malignant neoplasm of prostate [facility])  10/2006 Initial Diagnosis   Malignant neoplasm of prostate (HCC) Gleason score 7 and PSA of 31   2008 Surgery   Prostatectomy  pT2cN0, Gleason 4+3 disease. Rising PSA post-op to 52.   03/03/2007 Tumor Marker   PSA 39.89   02/18/2009 Tumor Marker   PSA 0.15. nadir   10/05/2020 Genetic Testing   Negative genetic testing on the CancerNext-Expanded+RNAinsight panel.  The CancerNext-Expanded gene panel offered by [facility] and includes sequencing and rearrangement analysis  for the following 77 genes: AIP, ALK, APC*, ATM*, AXIN2, BAP1, BARD1, BLM, BMPR1A, BRCA1*, BRCA2*, BRIP1*, CDC73, CDH1*, CDK4, CDKN1B, CDKN2A, CHEK2*, CTNNA1, DICER1, FANCC, FH, FLCN, GALNT12, KIF1B, LZTR1, MAX, MEN1, MET, MLH1*, MSH2*, MSH3, MSH6*, MUTYH*, NBN, NF1*, NF2, NTHL1, PALB2*, PHOX2B, PMS2*, POT1, PRKAR1A, PTCH1, PTEN*, RAD51C*, RAD51D*, RB1, RECQL, RET, SDHA, SDHAF2, SDHB, SDHC, SDHD, SMAD4, SMARCA4, SMARCB1, SMARCE1, STK11, SUFU, TMEM127, TP53*, TSC1,  TSC2, VHL and XRCC2 (sequencing and deletion/duplication); EGFR, EGLN1, HOXB13, KIT, MITF, PDGFRA, POLD1, and POLE (sequencing only); EPCAM and GREM1 (deletion/duplication only). DNA and RNA analyses performed for * genes. The report date is October 05, 2020.   09/23/2021 Imaging   BONE SCAN There are new foci of increased tracer uptake in the lower lumbar spine and 1 of the right upper ribs. Findings suggest possible skeletal metastatic disease. Please correlate with laboratory findings.   09/23/2021 Imaging   CT CAP IMPRESSION: 1. Status post prostatectomy. 2. 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 3. No CT evidence of new osseous or soft tissue metastatic disease in the chest, abdomen, or pelvis. 4. Unchanged sclerotic fractures of the bilateral posterior ribs.   08/28/2022 PET scan   PSMA PET Stable CT appearance of sclerotic bone metastases involving the right pubis and inferior pubic ramus, which show mild radiotracer uptake.   Small focus of radiotracer uptake in the left inferior pubis without CT correlate, but suspicious for bone metastasis.   09/30/2023 PET scan   PSMA PET 1. Single site of active metastatic prostate cancer skeletal metastasis in the LEFT inferior pubic ramus. 2. No evidence of nodal metastasis or visceral metastasis.   09/30/2023 Tumor Marker   PSA<0.1 SINCE 09/19/19     10/22/2023 Imaging   MRI pelvis Metastatic lesions in the right pubic body, right inferior pubic ramus, and left pubic body. The lesion in the left inferior pubic ramus is the most hypermetabolic on prior PMSA PET-CT.      PHYSICAL EXAMINATION: ECOG PERFORMANCE STATUS: 0 - Asymptomatic  Vitals:   04/06/24 0907  BP: 130/78  Pulse: 62  Resp: 17  Temp: (!) 97.3 F (36.3 C)  SpO2: 100%   Filed Weights   04/06/24 0907  Weight: 156 lb 3.2 oz (70.9 kg)    GENERAL: alert, no distress and comfortable SKIN: skin  color normal and no jaundice on exposed skin Healing scab over the right lower leg EYES: sclera clear NECK: No palpable mass LYMPH:  no palpable cervical, axillary lymphadenopathy  LUNGS: clear to auscultation and percussion with normal breathing effort HEART: regular rate & rhythm  ABDOMEN: abdomen soft, non-tender and nondistended. Musculoskeletal: no edema  Relevant data reviewed during this visit included labs.

## 2024-04-05 NOTE — Assessment & Plan Note (Addendum)
 Supportive last bone mineral density study was in 2020 with osteoporosis calcium  (1000-1200 mg daily from food and supplements) and vitamin D3 (1000 IU daily) Has been on Xgeva . Will continue. Due today Routine dental care Prevent diabetes Aggressive cardiovascular risk management Weight-bearing exercises (30 minutes per day) Limit alcohol consumption and avoid smoking

## 2024-04-05 NOTE — Assessment & Plan Note (Addendum)
 Continue ADT/AAP Labs in about 3 months.  Adding LDH and CEA. Repeat Lupron  about 11/14 and follow-up with lab and me. PSMA PET will be ordered next time after discussed today

## 2024-04-06 ENCOUNTER — Inpatient Hospital Stay

## 2024-04-06 ENCOUNTER — Inpatient Hospital Stay (HOSPITAL_BASED_OUTPATIENT_CLINIC_OR_DEPARTMENT_OTHER)

## 2024-04-06 VITALS — BP 130/78 | HR 62 | Temp 97.3°F | Resp 17 | Wt 156.2 lb

## 2024-04-06 DIAGNOSIS — Z9189 Other specified personal risk factors, not elsewhere classified: Secondary | ICD-10-CM | POA: Diagnosis not present

## 2024-04-06 DIAGNOSIS — E785 Hyperlipidemia, unspecified: Secondary | ICD-10-CM | POA: Insufficient documentation

## 2024-04-06 DIAGNOSIS — C61 Malignant neoplasm of prostate: Secondary | ICD-10-CM

## 2024-04-06 DIAGNOSIS — E7849 Other hyperlipidemia: Secondary | ICD-10-CM

## 2024-04-06 MED ORDER — DENOSUMAB 120 MG/1.7ML ~~LOC~~ SOLN
120.0000 mg | Freq: Once | SUBCUTANEOUS | Status: AC
  Administered 2024-04-06: 120 mg via SUBCUTANEOUS
  Filled 2024-04-06: qty 1.7

## 2024-04-06 MED ORDER — LEUPROLIDE ACETATE (3 MONTH) 22.5 MG ~~LOC~~ KIT
22.5000 mg | PACK | Freq: Once | SUBCUTANEOUS | Status: AC
  Administered 2024-04-06: 22.5 mg via SUBCUTANEOUS
  Filled 2024-04-06: qty 22.5

## 2024-04-06 NOTE — Assessment & Plan Note (Addendum)
 Patient wants to avoid statin Will take fish oil consistently Reduce saturated red meat.

## 2024-04-10 ENCOUNTER — Other Ambulatory Visit: Payer: Self-pay

## 2024-04-10 NOTE — Progress Notes (Signed)
 Clinical Intervention Note  Clinical Intervention Notes: Patient requested a prednisone  refill, but it was too early. Upon inquiry, he stated he had been taking two tablets daily instead of the prescribed one, believing it increased his energy. I advised him to follow the prescribed dosing and to consult his provider regarding any changes to the regimen, including risks and benefits. I explained that early refills may lead to insurance denials and counseled him on the potential long-term side effects of higher steroid doses. The patient was receptive, agreed to resume the prescribed dosing, and plans to discuss this with his provider at his next appointment.   Clinical Intervention Outcomes: Improved therapy adherence; Prevention of an adverse drug event   David Huffman Blair Karel Santa

## 2024-04-13 ENCOUNTER — Other Ambulatory Visit: Payer: Self-pay

## 2024-04-13 ENCOUNTER — Other Ambulatory Visit (HOSPITAL_COMMUNITY): Payer: Self-pay

## 2024-04-13 DIAGNOSIS — C61 Malignant neoplasm of prostate: Secondary | ICD-10-CM

## 2024-04-14 ENCOUNTER — Other Ambulatory Visit: Payer: Self-pay

## 2024-04-14 ENCOUNTER — Encounter: Payer: Self-pay | Admitting: Internal Medicine

## 2024-04-14 MED ORDER — ABIRATERONE ACETATE 250 MG PO TABS
ORAL_TABLET | ORAL | 2 refills | Status: DC
  Filled 2024-04-14: qty 120, fill #0
  Filled 2024-04-26: qty 120, 30d supply, fill #0
  Filled 2024-05-22: qty 120, 30d supply, fill #1
  Filled 2024-06-15: qty 120, 30d supply, fill #2

## 2024-04-17 ENCOUNTER — Other Ambulatory Visit: Payer: Self-pay

## 2024-04-18 ENCOUNTER — Other Ambulatory Visit (HOSPITAL_BASED_OUTPATIENT_CLINIC_OR_DEPARTMENT_OTHER): Payer: Self-pay

## 2024-04-18 ENCOUNTER — Other Ambulatory Visit: Payer: Self-pay

## 2024-04-18 ENCOUNTER — Other Ambulatory Visit (HOSPITAL_COMMUNITY): Payer: Self-pay

## 2024-04-18 ENCOUNTER — Ambulatory Visit: Admitting: Physician Assistant

## 2024-04-18 ENCOUNTER — Encounter: Payer: Self-pay | Admitting: Physician Assistant

## 2024-04-18 VITALS — BP 120/80 | HR 61 | Temp 97.2°F | Ht 68.0 in | Wt 158.4 lb

## 2024-04-18 DIAGNOSIS — H029 Unspecified disorder of eyelid: Secondary | ICD-10-CM

## 2024-04-18 MED ORDER — AMOXICILLIN-POT CLAVULANATE 875-125 MG PO TABS
1.0000 | ORAL_TABLET | Freq: Two times a day (BID) | ORAL | 0 refills | Status: AC
  Filled 2024-04-18: qty 20, 10d supply, fill #0

## 2024-04-18 NOTE — Progress Notes (Signed)
 lSamuel Huffman is a 63 y.o. male here for a new problem.  History of Present Illness:   Chief Complaint  Patient presents with   Eye Problem    Pt c/o bump on right upper eyelid, red.    Discussed the use of AI scribe software for clinical note transcription with the patient, who gave verbal consent to proceed.  History of Present Illness David Huffman is a 63 year old male who presents with a lesion on his eyelid.  The lesion on his eyelid resembles a small abscess or pimple. It is not painful and appears to be healing, showing improvement from the previous day. There is no crusting, tearing, or drainage from the eye, and no changes in vision, although he occasionally experiences 'black spots'. He has no fever or chills. Visine eye drops used yesterday provided some relief. He has not used compresses or ibuprofen but is open to trying them. He associates the lesion's improvement with a good night's sleep following a weekend of disturbed sleep.    Past Medical History:  Diagnosis Date   Family history of breast cancer    Family history of prostate cancer    Prostate cancer (HCC) 08/2006     Social History   Tobacco Use   Smoking status: Never   Smokeless tobacco: Never  Vaping Use   Vaping status: Never Used  Substance Use Topics   Alcohol use: Yes    Comment: social   Drug use: Never    Past Surgical History:  Procedure Laterality Date   PROSTATE BIOPSY     PROSTATECTOMY     VASECTOMY      Family History  Problem Relation Age of Onset   Cancer Father 39       prostate   Leukemia Father        CLL   Brain cancer Father    Cancer Paternal Grandfather        prostate   Cancer Paternal Uncle        prostate   Lymphoma Mother    Diabetes Maternal Aunt    Clotting disorder Maternal Grandmother    Other Maternal Grandfather        heart problem due to rheumatic fever   Breast cancer Paternal Grandmother 2   Cancer Paternal Uncle        tumor on his spine     No Known Allergies  Current Medications:   Current Outpatient Medications:    abiraterone  acetate (ZYTIGA ) 250 MG tablet, TAKE 4 TABLETS (1000MG ) BY MOUTH DAILY ON AN EMPTY STOMACH 1 HOUR BEFORE OR 2 HOURS AFTER A MEAL, Disp: 120 tablet, Rfl: 2   Ascorbic Acid (VITAMIN C) 1000 MG tablet, Take 1,000 mg by mouth daily., Disp: , Rfl:    b complex vitamins tablet, Take 1 tablet by mouth daily., Disp: , Rfl:    Calcium  Carb-Cholecalciferol  (OYSTER SHELL CALCIUM  W/D) 500-5 MG-MCG TABS, Take 1 tablet by mouth 2 (two) times daily., Disp: 60 tablet, Rfl: 3   denosumab  (XGEVA ) 120 MG/1.7ML SOLN injection, Inject 120 mg into the skin once., Disp: , Rfl:    fluticasone  (FLONASE ) 50 MCG/ACT nasal spray, Place 2 sprays into both nostrils daily. (Patient taking differently: Place 2 sprays into both nostrils daily as needed.), Disp: 16 g, Rfl: 6   leuprolide  (LUPRON ) 22.5 MG injection, Inject 22.5 mg into the muscle every 3 (three) months., Disp: , Rfl:    loratadine  (CLARITIN ) 10 MG tablet, Take 1 tablet (10 mg  total) by mouth daily. (Patient taking differently: Take 10 mg by mouth daily as needed.), Disp: 30 tablet, Rfl: 11   Multiple Vitamins-Minerals (MULTIVITAMIN PO), Take 1 tablet by mouth daily., Disp: , Rfl:    Omega-3 Fatty Acids (FISH OIL) 1000 MG CAPS, Take 1,000 mg by mouth daily., Disp: , Rfl:    predniSONE  (DELTASONE ) 5 MG tablet, Take 1 tablet (5 mg total) by mouth daily with breakfast., Disp: 90 tablet, Rfl: 3   Saline (SIMPLY SALINE) 0.9 % AERS, Place 2 each into the nose as directed. Use nightly for sinus hygiene long-term.  Can also be used as many times daily as desired to assist with clearing congested sinuses., Disp: 127 mL, Rfl: 11   Review of Systems:   Negative unless otherwise specified per HPI.  Vitals:   Vitals:   04/18/24 1021  BP: 120/80  Pulse: 61  Temp: (!) 97.2 F (36.2 C)  TempSrc: Temporal  SpO2: 99%  Weight: 158 lb 6.1 oz (71.8 kg)  Height: 5' 8 (1.727 m)      Body mass index is 24.08 kg/m.  Physical Exam:   Physical Exam Vitals and nursing note reviewed.  Constitutional:      Appearance: He is well-developed.  HENT:     Head: Normocephalic.  Eyes:     Extraocular Movements:     Right eye: Normal extraocular motion.     Left eye: Normal extraocular motion.     Conjunctiva/sclera: Conjunctivae normal.     Pupils: Pupils are equal, round, and reactive to light.     Comments: Erythematous moderately swollen left upper eye lid with raised lesion to lateral aspect  Pulmonary:     Effort: Pulmonary effort is normal.  Musculoskeletal:        General: Normal range of motion.     Cervical back: Normal range of motion.  Skin:    General: Skin is warm and dry.  Neurological:     Mental Status: He is alert and oriented to person, place, and time.  Psychiatric:        Behavior: Behavior normal.        Thought Content: Thought content normal.        Judgment: Judgment normal.      Assessment and Plan:   Assessment and Plan Assessment & Plan Eyelid pain Eyelid abscess vs chalazion likely healing Appears to be scabbing over, possibly improving - Prescribed oral Augmentin  to cover for cellulitis - Advised warm compresses and ibuprofen for symptom relief. - recommend follow up with eye doctor if no improvement or any worsening     Lucie Buttner, PA-C

## 2024-04-18 NOTE — Patient Instructions (Addendum)
 Start antibiotic(s) -- if any concerns, please let me know.

## 2024-04-26 ENCOUNTER — Other Ambulatory Visit: Payer: Self-pay

## 2024-04-26 ENCOUNTER — Encounter: Payer: Self-pay | Admitting: Hematology

## 2024-04-26 NOTE — Progress Notes (Signed)
 Specialty Pharmacy Refill Coordination Note  David Huffman is a 63 y.o. male contacted today regarding refills of specialty medication(s) Abiraterone  Acetate (ZYTIGA )   Patient requested Delivery   Delivery date: 04/27/24   Verified address: [address] [place] [address]   Medication will be filled on 04/26/24.

## 2024-04-27 ENCOUNTER — Other Ambulatory Visit (HOSPITAL_COMMUNITY): Payer: Self-pay

## 2024-05-22 ENCOUNTER — Other Ambulatory Visit: Payer: Self-pay

## 2024-05-23 ENCOUNTER — Other Ambulatory Visit: Payer: Self-pay

## 2024-05-23 ENCOUNTER — Encounter: Payer: Self-pay | Admitting: Hematology

## 2024-05-23 NOTE — Progress Notes (Signed)
 Specialty Pharmacy Refill Coordination Note  David Huffman is a 64 y.o. male contacted today regarding refills of specialty medication(s) Abiraterone  Acetate (ZYTIGA )   Patient requested Delivery   Delivery date: 05/26/24   Verified address: [address] [place] [address]   Medication will be filled on 05/25/24.

## 2024-05-24 ENCOUNTER — Other Ambulatory Visit: Payer: Self-pay

## 2024-06-13 ENCOUNTER — Other Ambulatory Visit: Payer: Self-pay

## 2024-06-15 ENCOUNTER — Other Ambulatory Visit: Payer: Self-pay

## 2024-06-15 ENCOUNTER — Other Ambulatory Visit (HOSPITAL_COMMUNITY): Payer: Self-pay

## 2024-06-15 NOTE — Progress Notes (Signed)
 Specialty Pharmacy Refill Coordination Note  David Huffman is a 63 y.o. male contacted today regarding refills of specialty medication(s) Abiraterone  Acetate (ZYTIGA )   Patient requested Delivery   Delivery date: 06/22/24   Verified address: [address] [place] [address]   Medication will be filled on 06/21/24.

## 2024-06-15 NOTE — Progress Notes (Signed)
 Specialty Pharmacy Ongoing Clinical Assessment Note  David Huffman is a 64 y.o. male who is being followed by the specialty pharmacy service for RxSp Oncology   Patient's specialty medication(s) reviewed today: Abiraterone  Acetate (ZYTIGA )   Missed doses in the last 4 weeks: 0   Patient/Caregiver did not have any additional questions or concerns.   Therapeutic benefit summary: Patient is achieving benefit   Adverse events/side effects summary: Experienced adverse events/side effects (fatigue, tolerable)   Patient's therapy is appropriate to: Continue    Goals Addressed             This Visit's Progress    Slow Disease Progression   On track    Patient is on track. Patient will maintain adherence.  PSA remains <0.1 ng/mL as of 12/28/23.         Follow up: 6 months  Silvano LOISE Dolly Specialty Pharmacist

## 2024-06-16 ENCOUNTER — Other Ambulatory Visit (HOSPITAL_COMMUNITY): Payer: Self-pay

## 2024-06-21 ENCOUNTER — Other Ambulatory Visit: Payer: Self-pay

## 2024-07-04 ENCOUNTER — Ambulatory Visit: Payer: Self-pay

## 2024-07-04 ENCOUNTER — Inpatient Hospital Stay

## 2024-07-04 DIAGNOSIS — Z5111 Encounter for antineoplastic chemotherapy: Secondary | ICD-10-CM | POA: Insufficient documentation

## 2024-07-04 DIAGNOSIS — C7951 Secondary malignant neoplasm of bone: Secondary | ICD-10-CM | POA: Diagnosis present

## 2024-07-04 DIAGNOSIS — C61 Malignant neoplasm of prostate: Secondary | ICD-10-CM | POA: Diagnosis present

## 2024-07-04 DIAGNOSIS — E7849 Other hyperlipidemia: Secondary | ICD-10-CM | POA: Insufficient documentation

## 2024-07-04 DIAGNOSIS — M810 Age-related osteoporosis without current pathological fracture: Secondary | ICD-10-CM | POA: Insufficient documentation

## 2024-07-04 LAB — CBC WITH DIFFERENTIAL (CANCER CENTER ONLY)
Abs Immature Granulocytes: 0.01 K/uL (ref 0.00–0.07)
Basophils Absolute: 0 K/uL (ref 0.0–0.1)
Basophils Relative: 1 %
Eosinophils Absolute: 0.1 K/uL (ref 0.0–0.5)
Eosinophils Relative: 3 %
HCT: 41.9 % (ref 39.0–52.0)
Hemoglobin: 14.7 g/dL (ref 13.0–17.0)
Immature Granulocytes: 0 %
Lymphocytes Relative: 40 %
Lymphs Abs: 1.9 K/uL (ref 0.7–4.0)
MCH: 30.6 pg (ref 26.0–34.0)
MCHC: 35.1 g/dL (ref 30.0–36.0)
MCV: 87.1 fL (ref 80.0–100.0)
Monocytes Absolute: 0.5 K/uL (ref 0.1–1.0)
Monocytes Relative: 11 %
Neutro Abs: 2.2 K/uL (ref 1.7–7.7)
Neutrophils Relative %: 45 %
Platelet Count: 233 K/uL (ref 150–400)
RBC: 4.81 MIL/uL (ref 4.22–5.81)
RDW: 12.2 % (ref 11.5–15.5)
WBC Count: 4.9 K/uL (ref 4.0–10.5)
nRBC: 0 % (ref 0.0–0.2)

## 2024-07-04 LAB — CMP (CANCER CENTER ONLY)
ALT: 17 U/L (ref 0–44)
AST: 23 U/L (ref 15–41)
Albumin: 4.3 g/dL (ref 3.5–5.0)
Alkaline Phosphatase: 53 U/L (ref 38–126)
Anion gap: 8 (ref 5–15)
BUN: 17 mg/dL (ref 8–23)
CO2: 27 mmol/L (ref 22–32)
Calcium: 9.6 mg/dL (ref 8.9–10.3)
Chloride: 107 mmol/L (ref 98–111)
Creatinine: 0.92 mg/dL (ref 0.61–1.24)
GFR, Estimated: >60 mL/min (ref >60)
Glucose, Bld: 72 mg/dL (ref 70–99)
Potassium: 4.2 mmol/L (ref 3.5–5.1)
Sodium: 142 mmol/L (ref 135–145)
Total Bilirubin: 1.7 mg/dL — ABNORMAL HIGH (ref 0.0–1.2)
Total Protein: 7.1 g/dL (ref 6.5–8.1)

## 2024-07-04 LAB — PSA: Prostatic Specific Antigen: <0.02 ng/mL (ref 0.00–4.00)

## 2024-07-04 LAB — LACTATE DEHYDROGENASE: LDH: 139 U/L (ref 105–235)

## 2024-07-04 LAB — CEA (ACCESS): CEA (CHCC): 1.26 ng/mL (ref 0.00–5.00)

## 2024-07-05 LAB — TESTOSTERONE: Testosterone: <3 ng/dL — ABNORMAL LOW (ref 264–916)

## 2024-07-05 NOTE — Assessment & Plan Note (Addendum)
 Patient wants to avoid statin Will take fish oil consistently Reduce saturated red meat.

## 2024-07-05 NOTE — Progress Notes (Signed)
 [facility] OFFICE PROGRESS NOTE  Patient Care Team: David [name] MATSU, MD as PCP - General (Internal Medicine) David Huffman, OD as Referring Physician (Optometry) David Callander, MD as Attending Physician (Hematology and Oncology)   David Huffman is a 63 y.o.male with unremarkable history being seen at [facility] for prostate cancer.    Current diagnosis: mCRPC Initial diagnosis: 2008 T2cN0, Gleason 4+3 disease Germline testing: Negative genetic testing on the CancerNext-Expanded+RNAinsight panel.  Somatic testing: Guardant 360 Current Treatment: since 12/2016 AAP added to Lupron  Previous treatment: The patient continued to have rise in his PSA up to 52 after prostatectomy without any measurable disease.  Lupron  was started and Casodex  50 mg daily added in 05/2011. Therapy discontinued in March 2018 because of progression of disease.  Radiation: 01/26/18 - 02/04/18: The target lesion in the right pubis and inferior pubic ramus were treated to 50 Gy in 5 fractions of 10 Gy (SBRT)  4/7-4/07/25 SBRT to left inferior pubic ramus.   Continue Lupron  at 22.5 mg.    Overall doing well. No new symptoms. Will follow up in 3 months, lab, Xgeva , Lupron  and PET before visit.  Assessment & Plan Malignant neoplasm of prostate (HCC) Continue ADT/AAP Labs in about 3 months.   Repeat Lupron  about 2/13 and follow-up with lab and me on 2/20. PSMA PET will be ordered next time after discussed today At risk for side effect of medication Supportive last bone mineral density study was in 2020 with osteoporosis. Recommend schedule the new one. calcium  (1000-1200 mg daily from food and supplements) and vitamin D3 (1000 IU daily) Has been on Xgeva . Will continue. Next in Feb Routine dental care Prevent diabetes Aggressive cardiovascular risk management Weight-bearing exercises (30 minutes per day) Limit alcohol consumption and avoid smoking Other hyperlipidemia Patient wants to avoid  statin Will take fish oil consistently Reduce saturated red meat.  Orders Placed This Encounter  Procedures   NM PET (PSMA) SKULL TO MID THIGH    Standing Status:   Future    Expected Date:   10/06/2024    Expiration Date:   07/06/2025    If indicated for the ordered procedure, I authorize the administration of a radiopharmaceutical per Radiology protocol:   Yes    Preferred imaging location?:   [facility]   CBC with Differential (Cancer Center Only)    Standing Status:   Future    Expiration Date:   07/06/2025   CMP (Cancer Center only)    Standing Status:   Future    Expiration Date:   07/06/2025   PSA    Standing Status:   Future    Expiration Date:   07/06/2025   Testosterone     Standing Status:   Future    Expiration Date:   07/06/2025   Lipid panel    Standing Status:   Future    Expiration Date:   07/06/2025     David JAYSON Chihuahua, MD  INTERVAL HISTORY: Patient returns for follow-up. Overall feeling well. No bone or back pain, no chest pain, rib pain. Exercising, biking and working w/out limitations.   Oncology History Overview Note   Cancer Staging  Malignant neoplasm of prostate [facility]) Staging form: Prostate, AJCC 7th Edition - Clinical: Stage IIB (T2c, N0, M0) - Signed by David Windell SAILOR, MD on 10/27/2013     Malignant neoplasm of prostate [facility])  10/2006 Initial Diagnosis   Malignant neoplasm of prostate (HCC) Gleason score 7 and PSA of 31   2008  Surgery   Prostatectomy  pT2cN0, Gleason 4+3 disease. Rising PSA post-op to 52.   03/03/2007 Tumor Marker   PSA 39.89   02/18/2009 Tumor Marker   PSA 0.15. nadir   10/05/2020 Genetic Testing   Negative genetic testing on the CancerNext-Expanded+RNAinsight panel.  The CancerNext-Expanded gene panel offered by [facility] and includes sequencing and rearrangement analysis for the following 77 genes: AIP, ALK, APC*, ATM*, AXIN2, BAP1, BARD1, BLM, BMPR1A, BRCA1*, BRCA2*, BRIP1*, CDC73, CDH1*, CDK4, CDKN1B, CDKN2A,  CHEK2*, CTNNA1, DICER1, FANCC, FH, FLCN, GALNT12, KIF1B, LZTR1, MAX, MEN1, MET, MLH1*, MSH2*, MSH3, MSH6*, MUTYH*, NBN, NF1*, NF2, NTHL1, PALB2*, PHOX2B, PMS2*, POT1, PRKAR1A, PTCH1, PTEN*, RAD51C*, RAD51D*, RB1, RECQL, RET, SDHA, SDHAF2, SDHB, SDHC, SDHD, SMAD4, SMARCA4, SMARCB1, SMARCE1, STK11, SUFU, TMEM127, TP53*, TSC1, TSC2, VHL and XRCC2 (sequencing and deletion/duplication); EGFR, EGLN1, HOXB13, KIT, MITF, PDGFRA, POLD1, and POLE (sequencing only); EPCAM and GREM1 (deletion/duplication only). DNA and RNA analyses performed for * genes. The report date is October 05, 2020.   09/23/2021 Imaging   BONE SCAN There are new foci of increased tracer uptake in the lower lumbar spine and 1 of the right upper ribs. Findings suggest possible skeletal metastatic disease. Please correlate with laboratory findings.   09/23/2021 Imaging   CT CAP IMPRESSION: 1. Status post prostatectomy. 2. 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 3. No CT evidence of new osseous or soft tissue metastatic disease in the chest, abdomen, or pelvis. 4. Unchanged sclerotic fractures of the bilateral posterior ribs.   08/28/2022 PET scan   PSMA PET Stable CT appearance of sclerotic bone metastases involving the right pubis and inferior pubic ramus, which show mild radiotracer uptake.   Small focus of radiotracer uptake in the left inferior pubis without CT correlate, but suspicious for bone metastasis.   09/30/2023 PET scan   PSMA PET 1. Single site of active metastatic prostate cancer skeletal metastasis in the LEFT inferior pubic ramus. 2. No evidence of nodal metastasis or visceral metastasis.   09/30/2023 Tumor Marker   PSA<0.1 SINCE 09/19/19     10/22/2023 Imaging   MRI pelvis Metastatic lesions in the right pubic body, right inferior pubic ramus, and left pubic body. The lesion in the left inferior pubic ramus is the most hypermetabolic on prior  PMSA PET-CT.      PHYSICAL EXAMINATION: ECOG PERFORMANCE STATUS: 0  Vitals:   07/06/24 0918  BP: 120/70  Pulse: 68  Resp: 20  Temp: (!) 97 F (36.1 C)  SpO2: 100%   Filed Weights   07/06/24 0918  Weight: 158 lb 1.6 oz (71.7 kg)    GENERAL: alert, no distress and comfortable SKIN: skin color normal and no jaundice  EYES: normal, sclera clear LYMPH:  no palpable cervical, axillary lymphadenopathy  LUNGS: clear to auscultation and no wheeze or rales with normal breathing effort HEART: regular rate & rhythm  ABDOMEN: abdomen soft, non-tender and nondistended. Musculoskeletal: no edema and no point tenderness   Relevant data reviewed during this visit included labs.  New labs and imaging ordered.

## 2024-07-05 NOTE — Assessment & Plan Note (Addendum)
 Continue ADT/AAP Labs in about 3 months.   Repeat Lupron  about 2/13 and follow-up with lab and me on 2/20. PSMA PET will be ordered next time after discussed today

## 2024-07-05 NOTE — Assessment & Plan Note (Addendum)
 Supportive last bone mineral density study was in 2020 with osteoporosis. Recommend schedule the new one. calcium  (1000-1200 mg daily from food and supplements) and vitamin D3 (1000 IU daily) Has been on Xgeva . Will continue. Next in Feb Routine dental care Prevent diabetes Aggressive cardiovascular risk management Weight-bearing exercises (30 minutes per day) Limit alcohol consumption and avoid smoking

## 2024-07-06 ENCOUNTER — Inpatient Hospital Stay

## 2024-07-06 VITALS — BP 120/70 | HR 68 | Temp 97.0°F | Resp 20 | Wt 158.1 lb

## 2024-07-06 DIAGNOSIS — Z9189 Other specified personal risk factors, not elsewhere classified: Secondary | ICD-10-CM

## 2024-07-06 DIAGNOSIS — C61 Malignant neoplasm of prostate: Secondary | ICD-10-CM

## 2024-07-06 DIAGNOSIS — E7849 Other hyperlipidemia: Secondary | ICD-10-CM | POA: Diagnosis not present

## 2024-07-06 MED ORDER — LEUPROLIDE ACETATE (3 MONTH) 22.5 MG ~~LOC~~ KIT
22.5000 mg | PACK | Freq: Once | SUBCUTANEOUS | Status: AC
  Administered 2024-07-06: 22.5 mg via SUBCUTANEOUS
  Filled 2024-07-06: qty 22.5

## 2024-07-12 ENCOUNTER — Other Ambulatory Visit: Payer: Self-pay

## 2024-07-12 ENCOUNTER — Other Ambulatory Visit (HOSPITAL_COMMUNITY): Payer: Self-pay

## 2024-07-12 DIAGNOSIS — C61 Malignant neoplasm of prostate: Secondary | ICD-10-CM

## 2024-07-12 MED ORDER — ABIRATERONE ACETATE 250 MG PO TABS
ORAL_TABLET | ORAL | 2 refills | Status: AC
  Filled 2024-07-12: qty 120, fill #0
  Filled 2024-07-13 – 2024-07-14 (×3): qty 120, 30d supply, fill #0
  Filled 2024-08-16: qty 120, 30d supply, fill #1
  Filled 2024-09-14: qty 120, 30d supply, fill #2

## 2024-07-13 ENCOUNTER — Other Ambulatory Visit (HOSPITAL_COMMUNITY): Payer: Self-pay

## 2024-07-14 ENCOUNTER — Other Ambulatory Visit (HOSPITAL_COMMUNITY): Payer: Self-pay

## 2024-07-14 ENCOUNTER — Other Ambulatory Visit: Payer: Self-pay

## 2024-07-17 ENCOUNTER — Encounter: Payer: Self-pay | Admitting: Hematology

## 2024-07-17 ENCOUNTER — Other Ambulatory Visit: Payer: Self-pay

## 2024-07-17 NOTE — Progress Notes (Signed)
 Specialty Pharmacy Refill Coordination Note  David Huffman is a 63 y.o. male contacted today regarding refills of specialty medication(s) Abiraterone  Acetate (ZYTIGA )   Patient requested Delivery   Delivery date: 07/26/24   Verified address: [address] [place] [address]   Medication will be filled on: 07/25/24

## 2024-07-19 ENCOUNTER — Other Ambulatory Visit: Payer: Self-pay

## 2024-07-24 ENCOUNTER — Other Ambulatory Visit: Payer: Self-pay

## 2024-08-16 ENCOUNTER — Other Ambulatory Visit: Payer: Self-pay

## 2024-08-18 ENCOUNTER — Other Ambulatory Visit: Payer: Self-pay

## 2024-08-21 ENCOUNTER — Other Ambulatory Visit (HOSPITAL_COMMUNITY): Payer: Self-pay

## 2024-08-21 ENCOUNTER — Other Ambulatory Visit: Payer: Self-pay

## 2024-08-21 NOTE — Progress Notes (Signed)
 Specialty Pharmacy Refill Coordination Note  David Huffman is a 63 y.o. male contacted today regarding refills of specialty medication(s) Abiraterone  Acetate (ZYTIGA )   Patient requested Delivery   Delivery date: 08/23/24   Verified address: [address] [place] [address]   Medication will be filled on: 08/22/24

## 2024-08-22 ENCOUNTER — Other Ambulatory Visit: Payer: Self-pay

## 2024-09-14 ENCOUNTER — Other Ambulatory Visit: Payer: Self-pay | Admitting: Hematology

## 2024-09-14 ENCOUNTER — Other Ambulatory Visit: Payer: Self-pay

## 2024-09-18 ENCOUNTER — Other Ambulatory Visit: Payer: Self-pay

## 2024-09-18 ENCOUNTER — Other Ambulatory Visit: Payer: Self-pay | Admitting: Hematology

## 2024-09-18 ENCOUNTER — Encounter: Payer: Self-pay | Admitting: Hematology

## 2024-09-18 DIAGNOSIS — C61 Malignant neoplasm of prostate: Secondary | ICD-10-CM

## 2024-09-18 MED ORDER — OYSTER SHELL CALCIUM W/D 500-5 MG-MCG PO TABS
1.0000 | ORAL_TABLET | Freq: Two times a day (BID) | ORAL | 3 refills | Status: AC
  Filled 2024-09-18: qty 60, 30d supply, fill #0

## 2024-09-18 MED ORDER — PREDNISONE 5 MG PO TABS
5.0000 mg | ORAL_TABLET | Freq: Every day | ORAL | 3 refills | Status: AC
  Filled 2024-09-18: qty 30, 30d supply, fill #0

## 2024-09-18 NOTE — Progress Notes (Signed)
 Specialty Pharmacy Refill Coordination Note  David Huffman is a 64 y.o. male contacted today regarding refills of specialty medication(s) Abiraterone  Acetate (ZYTIGA )   Patient requested Delivery   Delivery date: 09/22/24   Verified address: [address] [place] [address]   Medication will be filled on: 09/21/24  Refill requests sent on prednisone  and oyster shell calcium . Ship all meds together.

## 2024-09-19 ENCOUNTER — Other Ambulatory Visit: Payer: Self-pay

## 2024-09-21 ENCOUNTER — Other Ambulatory Visit: Payer: Self-pay

## 2024-10-06 ENCOUNTER — Inpatient Hospital Stay

## 2024-10-06 ENCOUNTER — Encounter (HOSPITAL_COMMUNITY)

## 2024-10-13 ENCOUNTER — Inpatient Hospital Stay
